# Patient Record
Sex: Female | Born: 1955 | Race: White | Hispanic: No | Marital: Married | Smoking: Never smoker
Health system: Southern US, Community
[De-identification: ages and names within clinical notes are randomized; demographics above are authoritative.]

## PROBLEM LIST (undated history)

## (undated) DIAGNOSIS — G4730 Sleep apnea, unspecified: Secondary | ICD-10-CM

## (undated) DIAGNOSIS — R0600 Dyspnea, unspecified: Secondary | ICD-10-CM

## (undated) DIAGNOSIS — R011 Cardiac murmur, unspecified: Secondary | ICD-10-CM

## (undated) DIAGNOSIS — K219 Gastro-esophageal reflux disease without esophagitis: Secondary | ICD-10-CM

## (undated) DIAGNOSIS — R7303 Prediabetes: Secondary | ICD-10-CM

## (undated) DIAGNOSIS — M1990 Unspecified osteoarthritis, unspecified site: Secondary | ICD-10-CM

## (undated) DIAGNOSIS — Z9889 Other specified postprocedural states: Secondary | ICD-10-CM

## (undated) DIAGNOSIS — E039 Hypothyroidism, unspecified: Secondary | ICD-10-CM

## (undated) DIAGNOSIS — I519 Heart disease, unspecified: Secondary | ICD-10-CM

## (undated) DIAGNOSIS — I10 Essential (primary) hypertension: Secondary | ICD-10-CM

## (undated) DIAGNOSIS — K8689 Other specified diseases of pancreas: Secondary | ICD-10-CM

## (undated) DIAGNOSIS — Z8489 Family history of other specified conditions: Secondary | ICD-10-CM

## (undated) HISTORY — PX: NASAL SINUS SURGERY: SHX719

---

## 1998-08-16 ENCOUNTER — Ambulatory Visit (HOSPITAL_COMMUNITY): Admission: RE | Admit: 1998-08-16 | Discharge: 1998-08-16 | Payer: Self-pay | Admitting: Internal Medicine

## 1998-08-17 ENCOUNTER — Encounter: Payer: Self-pay | Admitting: Internal Medicine

## 1998-09-01 ENCOUNTER — Ambulatory Visit (HOSPITAL_COMMUNITY): Admission: RE | Admit: 1998-09-01 | Discharge: 1998-09-01 | Payer: Self-pay | Admitting: Internal Medicine

## 1998-09-01 ENCOUNTER — Encounter: Payer: Self-pay | Admitting: Internal Medicine

## 1999-08-15 HISTORY — PX: TUBAL LIGATION: SHX77

## 2000-07-02 ENCOUNTER — Encounter: Payer: Self-pay | Admitting: Otolaryngology

## 2000-07-02 ENCOUNTER — Encounter: Admission: RE | Admit: 2000-07-02 | Discharge: 2000-07-02 | Payer: Self-pay | Admitting: Otolaryngology

## 2000-09-05 ENCOUNTER — Other Ambulatory Visit: Admission: RE | Admit: 2000-09-05 | Discharge: 2000-09-05 | Payer: Self-pay | Admitting: Otolaryngology

## 2000-09-05 ENCOUNTER — Encounter (INDEPENDENT_AMBULATORY_CARE_PROVIDER_SITE_OTHER): Payer: Self-pay

## 2001-03-16 ENCOUNTER — Other Ambulatory Visit: Payer: Self-pay

## 2001-03-16 ENCOUNTER — Encounter: Payer: Self-pay | Admitting: Emergency Medicine

## 2001-03-16 ENCOUNTER — Emergency Department (HOSPITAL_COMMUNITY): Admission: EM | Admit: 2001-03-16 | Discharge: 2001-03-17 | Payer: Self-pay | Admitting: Emergency Medicine

## 2001-03-16 ENCOUNTER — Other Ambulatory Visit (HOSPITAL_COMMUNITY): Payer: Self-pay

## 2001-04-03 ENCOUNTER — Ambulatory Visit (HOSPITAL_COMMUNITY): Admission: RE | Admit: 2001-04-03 | Discharge: 2001-04-03 | Payer: Self-pay | Admitting: Cardiology

## 2001-04-03 ENCOUNTER — Encounter: Payer: Self-pay | Admitting: Cardiology

## 2002-03-01 ENCOUNTER — Encounter: Payer: Self-pay | Admitting: Emergency Medicine

## 2002-03-01 ENCOUNTER — Emergency Department (HOSPITAL_COMMUNITY): Admission: EM | Admit: 2002-03-01 | Discharge: 2002-03-02 | Payer: Self-pay | Admitting: Emergency Medicine

## 2002-03-02 ENCOUNTER — Encounter: Payer: Self-pay | Admitting: Emergency Medicine

## 2004-04-17 ENCOUNTER — Emergency Department (HOSPITAL_COMMUNITY): Admission: EM | Admit: 2004-04-17 | Discharge: 2004-04-17 | Payer: Self-pay | Admitting: Emergency Medicine

## 2005-09-27 ENCOUNTER — Other Ambulatory Visit: Admission: RE | Admit: 2005-09-27 | Discharge: 2005-09-27 | Payer: Self-pay | Admitting: Obstetrics and Gynecology

## 2008-08-14 HISTORY — PX: LEFT HEART CATH AND CORONARY ANGIOGRAPHY: CATH118249

## 2008-11-01 ENCOUNTER — Inpatient Hospital Stay (HOSPITAL_COMMUNITY): Admission: EM | Admit: 2008-11-01 | Discharge: 2008-11-02 | Payer: Self-pay | Admitting: Emergency Medicine

## 2010-04-01 ENCOUNTER — Observation Stay (HOSPITAL_COMMUNITY): Admission: EM | Admit: 2010-04-01 | Discharge: 2010-04-02 | Payer: Self-pay | Admitting: Cardiovascular Disease

## 2010-10-27 LAB — CARDIAC PANEL(CRET KIN+CKTOT+MB+TROPI)
CK, MB: 1.2 ng/mL (ref 0.3–4.0)
Total CK: 76 U/L (ref 7–177)
Troponin I: 0.51 ng/mL (ref 0.00–0.06)

## 2010-10-27 LAB — COMPREHENSIVE METABOLIC PANEL
AST: 17 U/L (ref 0–37)
Albumin: 4 g/dL (ref 3.5–5.2)
BUN: 12 mg/dL (ref 6–23)
Chloride: 104 mEq/L (ref 96–112)
GFR calc non Af Amer: >60 mL/min (ref >60)
Glucose, Bld: 98 mg/dL (ref 70–99)
Sodium: 135 mEq/L (ref 135–145)
Total Bilirubin: 0.5 mg/dL (ref 0.3–1.2)

## 2010-10-27 LAB — CBC
HCT: 38.5 % (ref 36.0–46.0)
MCHC: 33 g/dL (ref 30.0–36.0)
Platelets: 213 10*3/uL (ref 150–400)

## 2010-10-27 LAB — DIFFERENTIAL
Basophils Absolute: 0 10*3/uL (ref 0.0–0.1)
Eosinophils Absolute: 0.1 10*3/uL (ref 0.0–0.7)
Eosinophils Relative: 1 % (ref 0–5)
Lymphocytes Relative: 19 % (ref 12–46)
Monocytes Relative: 6 % (ref 3–12)

## 2010-10-27 LAB — BRAIN NATRIURETIC PEPTIDE: Pro B Natriuretic peptide (BNP): 32 pg/mL (ref 0.0–100.0)

## 2010-10-27 LAB — PROTIME-INR: Prothrombin Time: 13.6 seconds (ref 11.6–15.2)

## 2010-10-27 LAB — D-DIMER, QUANTITATIVE: D-Dimer, Quant: 0.48 ug/mL-FEU (ref 0.00–0.48)

## 2010-11-24 LAB — BASIC METABOLIC PANEL
BUN: 17 mg/dL (ref 6–23)
CO2: 29 mEq/L (ref 19–32)
Calcium: 9 mg/dL (ref 8.4–10.5)
Chloride: 106 mEq/L (ref 96–112)
Creatinine, Ser: 0.69 mg/dL (ref 0.4–1.2)
GFR calc Af Amer: >60 mL/min (ref >60)
GFR calc non Af Amer: >60 mL/min (ref >60)
Glucose, Bld: 108 mg/dL — ABNORMAL HIGH (ref 70–99)
Potassium: 3.6 mEq/L (ref 3.5–5.1)
Sodium: 139 mEq/L (ref 135–145)

## 2010-11-24 LAB — CBC
HCT: 38 % (ref 36.0–46.0)
Hemoglobin: 12.6 g/dL (ref 12.0–15.0)
MCHC: 33.3 g/dL (ref 30.0–36.0)
MCV: 81.1 fL (ref 78.0–100.0)
Platelets: 234 10*3/uL (ref 150–400)
RBC: 4.69 MIL/uL (ref 3.87–5.11)
RDW: 14.5 % (ref 11.5–15.5)
WBC: 8.1 10*3/uL (ref 4.0–10.5)

## 2010-11-24 LAB — LIPID PANEL
Total CHOL/HDL Ratio: 2.9 RATIO
Triglycerides: 69 mg/dL (ref <150)
VLDL: 14 mg/dL (ref 0–40)

## 2010-11-24 LAB — DIFFERENTIAL
Eosinophils Absolute: 0.1 10*3/uL (ref 0.0–0.7)
Monocytes Relative: 7 % (ref 3–12)
Neutro Abs: 5.8 10*3/uL (ref 1.7–7.7)

## 2010-11-24 LAB — CK TOTAL AND CKMB (NOT AT ARMC)
CK, MB: 1.3 ng/mL (ref 0.3–4.0)
Relative Index: INVALID (ref 0.0–2.5)
Total CK: 68 U/L (ref 7–177)

## 2010-11-24 LAB — POCT CARDIAC MARKERS
CKMB, poc: <1 ng/mL — ABNORMAL LOW (ref 1.0–8.0)
CKMB, poc: <1 ng/mL — ABNORMAL LOW (ref 1.0–8.0)
Myoglobin, poc: 56.6 ng/mL (ref 12–200)
Troponin i, poc: <0.05 ng/mL (ref 0.00–0.09)
Troponin i, poc: <0.05 ng/mL (ref 0.00–0.09)

## 2010-11-24 LAB — TROPONIN I: Troponin I: <0.01 ng/mL (ref 0.00–0.06)

## 2010-11-24 LAB — PROTIME-INR: INR: 1 (ref 0.00–1.49)

## 2010-11-24 LAB — HEPARIN LEVEL (UNFRACTIONATED): Heparin Unfractionated: 0.15 IU/mL — ABNORMAL LOW (ref 0.30–0.70)

## 2010-12-30 NOTE — Discharge Summary (Signed)
[name], [name]          ACCOUNT NO.:  [id number]   MEDICAL RECORD NO.:  [id number]          PATIENT TYPE:  INP   LOCATION:  2002                         FACILITY:  [facility]   PHYSICIAN:  Ricki Rodriguez, M.D.  DATE OF BIRTH:  05-30-1956   DATE OF ADMISSION:  11/01/2008  DATE OF DISCHARGE:  11/02/2008                               DISCHARGE SUMMARY   FINAL DIAGNOSES:  1. Chest pain.  2. Anxiety.   DISCHARGE MEDICATIONS:  1. Norvasc 2.5 mg 1 daily.  2. Metoprolol 25 mg half twice daily.  3. Valium 5 mg 1 daily as needed.   Return to Dr. Algie Coffer in 2 weeks.  The patient to call [phone] for  appointment.   DISCHARGE DIET:  Low-sodium, heart-healthy diet.   DISCHARGE ACTIVITY:  The patient is to increase activity slowly.  The  patient to notify right groin pain, swelling, or discharge.   HISTORY:  This 55 year old white female, who presented with 60-month  history of left arm numbness lasting for 5-10 minutes.   PHYSICAL EXAMINATION:  VITAL SIGNS:  Temperature 97, pulse 91,  respirations 18, blood pressure 136/73.  GENERAL:  The patient is well-built, well-nourished white female in no  acute distress.  HEENT: The patient is normocephalic, atraumatic with brown eyes.  Pupils  equally reactive to light.  Extraocular movement intact.  She wears  glasses.  NECK:  Supple.  Negative JVD.  LUNGS:  Clear bilaterally.  HEART:  Normal S1 and S2.  Regular rate and rhythm.  ABDOMEN:  Soft and nontender.  EXTREMITIES:  No edema, cyanosis, or clubbing.  CNS:  Cranial nerves grossly intact and the patient moves all 4  extremities.  She is alert and oriented x3.   LABORATORY DATA:  Normal hemoglobin/hematocrit, WBC count, platelet  count.  Normal PT/INR.  Normal electrolytes, BUN, creatinine.  Normal CK-  MB, troponin I.  Normal cholesterol, triglyceride, and HDL cholesterol.   EKG:  Normal sinus rhythm.  Nuclear stress test suggestive of possible  ischemia.   Cardiac  catheterization revealed normal coronaries.   HOSPITAL COURSE:  The patient was admitted to telemetry unit.  She  underwent nuclear stress test that showed possible ischemia, and she  underwent cardiac catheterization that failed to show any coronary  artery disease. The patient's medications were adjusted and she was  discharged home in satisfactory condition with followup by me in 1 week.      Ricki Rodriguez, M.D.  Electronically Signed     ASK/MEDQ  D:  12/23/2008  T:  12/24/2008  Job:  [id number]

## 2011-10-21 ENCOUNTER — Ambulatory Visit (INDEPENDENT_AMBULATORY_CARE_PROVIDER_SITE_OTHER): Payer: Managed Care, Other (non HMO) | Admitting: Family Medicine

## 2011-10-21 VITALS — BP 152/92 | HR 60 | Temp 98.4°F | Resp 18 | Ht 61.5 in | Wt 192.1 lb

## 2011-10-21 DIAGNOSIS — R21 Rash and other nonspecific skin eruption: Secondary | ICD-10-CM

## 2011-10-21 DIAGNOSIS — R198 Other specified symptoms and signs involving the digestive system and abdomen: Secondary | ICD-10-CM

## 2011-10-21 DIAGNOSIS — R197 Diarrhea, unspecified: Secondary | ICD-10-CM

## 2011-10-21 LAB — COMPREHENSIVE METABOLIC PANEL
ALT: 16 U/L (ref 0–35)
CO2: 30 mEq/L (ref 19–32)
Calcium: 9.4 mg/dL (ref 8.4–10.5)
Chloride: 104 mEq/L (ref 96–112)
Creat: 0.73 mg/dL (ref 0.50–1.10)
Glucose, Bld: 82 mg/dL (ref 70–99)
Total Protein: 7.7 g/dL (ref 6.0–8.3)

## 2011-10-21 LAB — POCT URINALYSIS DIPSTICK
Bilirubin, UA: NEGATIVE
Glucose, UA: NEGATIVE
Ketones, UA: NEGATIVE
Leukocytes, UA: NEGATIVE
Nitrite, UA: NEGATIVE

## 2011-10-21 LAB — POCT SKIN KOH: Skin KOH, POC: NEGATIVE

## 2011-10-21 LAB — POCT UA - MICROSCOPIC ONLY
Mucus, UA: NEGATIVE
RBC, urine, microscopic: NEGATIVE
Yeast, UA: NEGATIVE

## 2011-10-21 MED ORDER — DIPHENOXYLATE-ATROPINE 2.5-0.025 MG PO TABS: 1.0000 | ORAL_TABLET | Freq: Four times a day (QID) | ORAL | Status: AC | PRN

## 2011-10-21 MED ORDER — CLOTRIMAZOLE-BETAMETHASONE 1-0.05 % EX CREA: TOPICAL_CREAM | Freq: Two times a day (BID) | CUTANEOUS | Status: DC

## 2011-10-21 NOTE — Progress Notes (Signed)
  Subjective:    Patient ID: [name], female    DOB: 1955-09-04, 56 y.o.   MRN: [id number]  HPI 56 yo female with diarrhea symptoms. For over a month, loose stools 5-6 times a day.  Did have colonscopy 1 year ago - diverticulosis but otherwise normal.  Was just screening colonoscopy.  Also feels bloated and that GERD worse.  Works [place] but no recent travel.  Yesterday started having abdominal pain and cramping.  Wasn't previously.  No blood, mucus.  Normal color.  NO recent antibiotics. NO trouble with bowels like this before.  No new meds. Also some incresaed urinary frequency.  No pain.  Worried about "bacteria in her body".  Spot on right buttock for a couple weeks - noticed in mirror.  Doesn't itch or hurt.  Red and flaky.   Saw podiatrist and being treated for foot fungus.  Hasn't started terbinafine yet.    Review of Systems     Objective:   Physical Exam  Constitutional: Vital signs are normal. She appears well-developed and well-nourished. She is active.  Cardiovascular: Normal rate, regular rhythm, normal heart sounds and normal pulses.   Pulmonary/Chest: Effort normal and breath sounds normal.  Abdominal: Soft. Normal appearance and bowel sounds are normal. She exhibits no distension and no mass. There is no hepatosplenomegaly. There is tenderness. There is no rigidity, no rebound, no guarding, no CVA tenderness, no tenderness at McBurney's point and negative Murphy's sign. No hernia.  Neurological: She is alert.   On right buttock, large, erythematous, scaly rash.  Flat.   Results for orders placed in visit on 10/21/11  POCT UA - MICROSCOPIC ONLY      Component Value Range   WBC, Ur, HPF, POC 0-1     RBC, urine, microscopic neg     Bacteria, U Microscopic neg     Mucus, UA neg     Epithelial cells, urine per micros 0-1     Crystals, Ur, HPF, POC neg     Casts, Ur, LPF, POC neg     Yeast, UA neg    POCT URINALYSIS DIPSTICK      Component Value Range   Color, UA yellow     Clarity, UA clear     Glucose, UA neg     Bilirubin, UA neg     Ketones, UA neg     Spec Grav, UA 1.010     Blood, UA trace-lysed     pH, UA 5.0     Protein, UA neg     Urobilinogen, UA 0.2     Nitrite, UA neg     Leukocytes, UA Negative    POCT SKIN KOH      Component Value Range   Skin KOH, POC Negative           Assessment & Plan:  Diarrhea, cramping - no red flags other than duration.  Will check stool studies.  In meantime try probiotics, fiber caplets, and lomotil (just for a couple days).  If studies negative and these meds no help, then refer to GI  Skin rash - KOH negative.  Still suspicious.  Lotrisone cream

## 2011-12-28 ENCOUNTER — Other Ambulatory Visit: Payer: Self-pay | Admitting: Gastroenterology

## 2011-12-28 DIAGNOSIS — R197 Diarrhea, unspecified: Secondary | ICD-10-CM

## 2011-12-29 ENCOUNTER — Ambulatory Visit
Admission: RE | Admit: 2011-12-29 | Discharge: 2011-12-29 | Disposition: A | Discharge status: Home / Self Care | Payer: Managed Care, Other (non HMO) | Source: Ambulatory Visit | Attending: Gastroenterology | Admitting: Gastroenterology

## 2011-12-29 DIAGNOSIS — R197 Diarrhea, unspecified: Secondary | ICD-10-CM

## 2011-12-29 MED ORDER — IOHEXOL 300 MG/ML  SOLN
100.0000 mL | Freq: Once | INTRAMUSCULAR | Status: AC | PRN
  Administered 2011-12-29: 100 mL via INTRAVENOUS

## 2012-03-16 ENCOUNTER — Encounter (HOSPITAL_COMMUNITY): Payer: Self-pay | Admitting: *Deleted

## 2012-03-16 ENCOUNTER — Emergency Department (HOSPITAL_COMMUNITY)
Admission: EM | Admit: 2012-03-16 | Discharge: 2012-03-16 | Disposition: A | Discharge status: Home / Self Care | Payer: Managed Care, Other (non HMO) | Attending: Emergency Medicine | Admitting: Emergency Medicine

## 2012-03-16 ENCOUNTER — Emergency Department (HOSPITAL_COMMUNITY): Payer: Managed Care, Other (non HMO)

## 2012-03-16 DIAGNOSIS — R0602 Shortness of breath: Secondary | ICD-10-CM | POA: Insufficient documentation

## 2012-03-16 DIAGNOSIS — R002 Palpitations: Secondary | ICD-10-CM | POA: Insufficient documentation

## 2012-03-16 DIAGNOSIS — I10 Essential (primary) hypertension: Secondary | ICD-10-CM | POA: Insufficient documentation

## 2012-03-16 DIAGNOSIS — R079 Chest pain, unspecified: Secondary | ICD-10-CM | POA: Insufficient documentation

## 2012-03-16 HISTORY — DX: Other specified diseases of pancreas: K86.89

## 2012-03-16 HISTORY — DX: Essential (primary) hypertension: I10

## 2012-03-16 LAB — CBC WITH DIFFERENTIAL/PLATELET
Basophils Absolute: 0 10*3/uL (ref 0.0–0.1)
Basophils Relative: 1 % (ref 0–1)
Eosinophils Absolute: 0.1 10*3/uL (ref 0.0–0.7)
Eosinophils Relative: 1 % (ref 0–5)
HCT: 36.4 % (ref 36.0–46.0)
Lymphocytes Relative: 21 % (ref 12–46)
MCH: 26.9 pg (ref 26.0–34.0)
MCHC: 33.5 g/dL (ref 30.0–36.0)
MCV: 80.2 fL (ref 78.0–100.0)
Monocytes Absolute: 0.6 10*3/uL (ref 0.1–1.0)
Platelets: 205 10*3/uL (ref 150–400)
RDW: 13.6 % (ref 11.5–15.5)
WBC: 8.1 10*3/uL (ref 4.0–10.5)

## 2012-03-16 LAB — COMPREHENSIVE METABOLIC PANEL
ALT: 15 U/L (ref 0–35)
AST: 15 U/L (ref 0–37)
CO2: 29 mEq/L (ref 19–32)
Calcium: 9.5 mg/dL (ref 8.4–10.5)
Creatinine, Ser: 0.68 mg/dL (ref 0.50–1.10)
GFR calc non Af Amer: >90 mL/min (ref >90)
Sodium: 142 mEq/L (ref 135–145)
Total Protein: 7.4 g/dL (ref 6.0–8.3)

## 2012-03-16 LAB — D-DIMER, QUANTITATIVE: D-Dimer, Quant: 0.33 ug/mL-FEU (ref 0.00–0.48)

## 2012-03-16 LAB — URINALYSIS, ROUTINE W REFLEX MICROSCOPIC
Hgb urine dipstick: NEGATIVE
Nitrite: NEGATIVE
Specific Gravity, Urine: 1.01 (ref 1.005–1.030)
Urobilinogen, UA: 0.2 mg/dL (ref 0.0–1.0)
pH: 7 (ref 5.0–8.0)

## 2012-03-16 LAB — POCT I-STAT TROPONIN I: Troponin i, poc: 0.01 ng/mL (ref 0.00–0.08)

## 2012-03-16 MED ORDER — SODIUM CHLORIDE 0.9 % IV BOLUS (SEPSIS)
500.0000 mL | Freq: Once | INTRAVENOUS | Status: AC
  Administered 2012-03-16: 1000 mL via INTRAVENOUS

## 2012-03-16 NOTE — ED Notes (Signed)
Patient given instructions and teach back method used patient verbalized an understanding.

## 2012-03-16 NOTE — ED Notes (Signed)
Patient advises that she has a slight discomfort in the upper back area and neck.

## 2012-03-16 NOTE — ED Provider Notes (Signed)
History     CSN: [id number]  Arrival date & time 03/16/12  1603   First MD Initiated Contact with Patient 03/16/12 1633      Chief Complaint  Patient presents with  . Palpitations    (Consider location/radiation/quality/duration/timing/severity/associated sxs/prior treatment) HPI Pt states that today around 1400 she was lying down and had palpitations described as a pounding heart beat but not fast. States she had sharp pain across her shoulder and mild SOB. Symptoms have since abated. No CP, lower ext swelling, N/V/D. Pt states she has not been drinking as much fluid lately.  Past Medical History  Diagnosis Date  . Pancreatic insufficiency   . Hypertension     History reviewed. No pertinent past surgical history.  History reviewed. No pertinent family history.  History  Substance Use Topics  . Smoking status: Never Smoker   . Smokeless tobacco: Not on file  . Alcohol Use: No    OB History    Grav Para Term Preterm Abortions TAB SAB Ect Mult Living                  Review of Systems  Constitutional: Negative for fever, chills and diaphoresis.  HENT: Negative for neck pain and neck stiffness.   Respiratory: Positive for shortness of breath. Negative for cough and wheezing.   Cardiovascular: Positive for palpitations. Negative for chest pain and leg swelling.  Gastrointestinal: Negative for nausea, vomiting, abdominal pain and diarrhea.  Genitourinary: Negative for dysuria.  Musculoskeletal: Positive for myalgias. Negative for joint swelling and arthralgias.  Skin: Negative for rash and wound.  Neurological: Negative for dizziness, syncope, weakness, numbness and headaches.    Allergies  Tylenol  Home Medications   Current Outpatient Rx  Name Route Sig Dispense Refill  . AMLODIPINE BESYLATE 5 MG PO TABS Oral Take 5 mg by mouth daily.    Marland Kitchen LOSARTAN POTASSIUM 100 MG PO TABS Oral Take 100 mg by mouth daily.    Marland Kitchen METOPROLOL TARTRATE 25 MG PO TABS Oral Take 25 mg  by mouth. 1/2 tab po bid    . MULTI-VITAMIN/MINERALS PO TABS Oral Take 1 tablet by mouth daily.    Marland Kitchen PANCRELIPASE (LIP-PROT-AMYL) 24000 UNITS PO CPEP Oral Take 1-2 capsules by mouth 3 (three) times daily with meals. Takes 2 capsules with meals and 1 capsule with snacks.      BP 123/86  Pulse 67  Temp 98.2 F (36.8 C) (Oral)  Resp 23  SpO2 100%  Physical Exam  Nursing note and vitals reviewed. Constitutional: She is oriented to person, place, and time. She appears well-developed and well-nourished. No distress.  HENT:  Head: Normocephalic and atraumatic.  Mouth/Throat: Oropharynx is clear and moist.  Eyes: EOM are normal. Pupils are equal, round, and reactive to light.  Neck: Normal range of motion. Neck supple.  Cardiovascular: Normal rate and regular rhythm.  Exam reveals no gallop and no friction rub.   No murmur heard. Pulmonary/Chest: Effort normal and breath sounds normal. No respiratory distress. She has no wheezes. She has no rales. She exhibits no tenderness.  Abdominal: Soft. Bowel sounds are normal. There is no tenderness. There is no rebound and no guarding.  Musculoskeletal: Normal range of motion. She exhibits no edema and no tenderness (no LE swelling or tenderness).  Neurological: She is alert and oriented to person, place, and time.       5/5 motor, sensation intact  Skin: Skin is warm and dry. No rash noted. No erythema.  Psychiatric:  She has a normal mood and affect. Her behavior is normal.    ED Course  Procedures (including critical care time)  Labs Reviewed  COMPREHENSIVE METABOLIC PANEL - Abnormal; Notable for the following:    Glucose, Bld 101 (*)     All other components within normal limits  URINALYSIS, ROUTINE W REFLEX MICROSCOPIC - Abnormal; Notable for the following:    Leukocytes, UA TRACE (*)     All other components within normal limits  CBC WITH DIFFERENTIAL  D-DIMER, QUANTITATIVE  POCT I-STAT TROPONIN I  URINE MICROSCOPIC-ADD ON   Dg  Chest 2 View  03/16/2012  *RADIOLOGY REPORT*  Clinical Data: Chest pain and heart palpitations.  CHEST - 2 VIEW  Comparison: 04/01/2010  Findings: The cardiomediastinal silhouette is unremarkable. There is no evidence of focal airspace disease, pulmonary edema, suspicious pulmonary nodule/mass, pleural effusion, or pneumothorax. No acute bony abnormalities are identified.  IMPRESSION: No evidence of active cardiopulmonary disease.  Original Report Authenticated By: Rosendo Gros, M.D.     1. Palpitations       Date: 03/16/2012  Rate:70  Rhythm: normal sinus rhythm  QRS Axis: normal  Intervals: normal  ST/T Wave abnormalities: normal  Conduction Disutrbances:none  Narrative Interpretation:   Old EKG Reviewed: unchanged   MDM   Pt remains asymptomatic in ED. Advised to return for worsening symptoms or any concerns. F/U with PMD and cardiologist       Loren Racer, MD 03/16/12 [phone]

## 2012-03-16 NOTE — ED Notes (Signed)
Pt reports lying down today, had onset of palpitations and pain to back of neck and shoulders. ekg being done at triage, no acute distress noted.

## 2012-03-16 NOTE — ED Notes (Signed)
Placed in a gown and on all monitors

## 2012-03-16 NOTE — ED Notes (Signed)
Patient transported to X-ray for a chest x-ray

## 2012-07-30 ENCOUNTER — Encounter (HOSPITAL_COMMUNITY): Payer: Self-pay | Admitting: Pharmacy Technician

## 2012-08-02 ENCOUNTER — Other Ambulatory Visit: Payer: Self-pay | Admitting: Cardiovascular Disease

## 2012-08-02 ENCOUNTER — Encounter (HOSPITAL_COMMUNITY): Payer: Self-pay | Admitting: *Deleted

## 2012-08-02 DIAGNOSIS — Z1231 Encounter for screening mammogram for malignant neoplasm of breast: Secondary | ICD-10-CM

## 2012-08-02 NOTE — Pre-Procedure Instructions (Signed)
Your procedure is scheduled on:Wednesday, August 21, 2012 Report to [facility] Admitting [id number] Call this number if you have problems morning of your procedure:[phone]  Follow all bowel prep instructions per your doctor's orders.  Do not eat or drink anything after midnight the night before your procedure. You may brush your teeth, rinse out your mouth, but no water, no food, no chewing gum, no mints, no candies, no chewing tobacco.     Take these medicines the morning of your procedure with A SIP OF WATER:Norvasc and Metoprolol   Please make arrangements for a responsible person to drive you home after the procedure. You cannot go home by cab/taxi. We recommend you have someone with you at home the first 24 hours after your procedure. Driver for procedure is mother Monque  LEAVE ALL VALUABLES, JEWELRY, BILLFOLD AT HOME.  NO DENTURES, CONTACT LENSES ALLOWED IN THE ENDOSCOPY ROOM.   YOU MAY WEAR DEODORANT, PLEASE REMOVE ALL JEWELRY, WATCHES RINGS, BODY PIERCINGS AND LEAVE AT HOME.   WOMEN: NO MAKE-UP, LOTIONS PERFUMES

## 2012-08-21 ENCOUNTER — Encounter (HOSPITAL_COMMUNITY): Payer: Self-pay | Admitting: *Deleted

## 2012-08-21 ENCOUNTER — Ambulatory Visit (HOSPITAL_COMMUNITY)
Admission: RE | Admit: 2012-08-21 | Discharge: 2012-08-21 | Disposition: A | Discharge status: Home / Self Care | Payer: Managed Care, Other (non HMO) | Source: Ambulatory Visit | Attending: Gastroenterology | Admitting: Gastroenterology

## 2012-08-21 ENCOUNTER — Ambulatory Visit (HOSPITAL_COMMUNITY): Payer: Managed Care, Other (non HMO) | Admitting: Anesthesiology

## 2012-08-21 ENCOUNTER — Encounter (HOSPITAL_COMMUNITY)
Admission: RE | Disposition: A | Discharge status: Home / Self Care | Payer: Self-pay | Source: Ambulatory Visit | Attending: Gastroenterology

## 2012-08-21 ENCOUNTER — Encounter (HOSPITAL_COMMUNITY): Payer: Self-pay | Admitting: Anesthesiology

## 2012-08-21 DIAGNOSIS — R109 Unspecified abdominal pain: Secondary | ICD-10-CM | POA: Insufficient documentation

## 2012-08-21 DIAGNOSIS — Z538 Procedure and treatment not carried out for other reasons: Secondary | ICD-10-CM | POA: Insufficient documentation

## 2012-08-21 DIAGNOSIS — Z01810 Encounter for preprocedural cardiovascular examination: Secondary | ICD-10-CM | POA: Insufficient documentation

## 2012-08-21 HISTORY — DX: Heart disease, unspecified: I51.9

## 2012-08-21 SURGERY — CANCELLED PROCEDURE
Anesthesia: Monitor Anesthesia Care

## 2012-08-21 MED ORDER — FENTANYL CITRATE 0.05 MG/ML IJ SOLN: 25.0000 ug | INTRAMUSCULAR | Status: DC | PRN

## 2012-08-21 MED ORDER — SODIUM CHLORIDE 0.9 % IV SOLN: INTRAVENOUS | Status: DC

## 2012-08-21 MED ORDER — LACTATED RINGERS IV SOLN: INTRAVENOUS | Status: DC

## 2012-08-21 MED ORDER — PROMETHAZINE HCL 25 MG/ML IJ SOLN: 6.2500 mg | INTRAMUSCULAR | Status: DC | PRN

## 2012-08-21 SURGICAL SUPPLY — 15 items

## 2012-08-21 NOTE — Anesthesia Preprocedure Evaluation (Addendum)
Anesthesia Evaluation  Patient identified by MRN, date of birth, ID band Patient awake    Reviewed: Allergy & Precautions, H&P , NPO status , Patient's Chart, lab work & pertinent test results  Airway Mallampati: II TM Distance: >3 FB Neck ROM: Full    Dental  (+) Teeth Intact and Dental Advisory Given   Pulmonary neg pulmonary ROS,  breath sounds clear to auscultation  Pulmonary exam normal       Cardiovascular hypertension, Pt. on medications and Pt. on home beta blockers + CAD Rhythm:Regular Rate:Normal  Recent episodes of atypical chest pain, occuring with work. Scheduled for EST recently but had to cancel secondary to URI (now resolved). Pain described as pressure without radiation, n/v or diaphoresis.   Neuro/Psych negative neurological ROS  negative psych ROS   GI/Hepatic negative GI ROS, Neg liver ROS, Pancreatic insufficiency   Endo/Other  negative endocrine ROS  Renal/GU negative Renal ROS  negative genitourinary   Musculoskeletal negative musculoskeletal ROS (+)   Abdominal   Peds negative pediatric ROS (+)  Hematology negative hematology ROS (+)   Anesthesia Other Findings   Reproductive/Obstetrics negative OB ROS                         Anesthesia Physical Anesthesia Plan  ASA: III  Anesthesia Plan: MAC   Post-op Pain Management:    Induction: Intravenous  Airway Management Planned: Nasal Cannula  Additional Equipment:   Intra-op Plan:   Post-operative Plan:   Informed Consent: I have reviewed the patients History and Physical, chart, labs and discussed the procedure including the risks, benefits and alternatives for the proposed anesthesia with the patient or authorized representative who has indicated his/her understanding and acceptance.   Dental advisory given  Plan Discussed with: CRNA  Anesthesia Plan Comments:         Anesthesia Quick Evaluation

## 2012-09-02 ENCOUNTER — Ambulatory Visit
Admission: RE | Admit: 2012-09-02 | Discharge: 2012-09-02 | Disposition: A | Discharge status: Home / Self Care | Payer: Managed Care, Other (non HMO) | Source: Ambulatory Visit | Attending: Cardiovascular Disease | Admitting: Cardiovascular Disease

## 2012-09-02 DIAGNOSIS — Z1231 Encounter for screening mammogram for malignant neoplasm of breast: Secondary | ICD-10-CM

## 2012-12-30 ENCOUNTER — Encounter (HOSPITAL_COMMUNITY): Payer: Self-pay | Admitting: Pharmacy Technician

## 2012-12-30 ENCOUNTER — Encounter (HOSPITAL_COMMUNITY): Payer: Self-pay | Admitting: *Deleted

## 2012-12-30 NOTE — Progress Notes (Signed)
Office visit from Dr. Algie Coffer 08/02/2012 ,Stress Test 08/22/2012 from Dr. Algie Coffer  All on chart.

## 2012-12-31 ENCOUNTER — Other Ambulatory Visit: Payer: Self-pay | Admitting: Gastroenterology

## 2013-01-01 ENCOUNTER — Ambulatory Visit (HOSPITAL_COMMUNITY): Payer: Managed Care, Other (non HMO) | Admitting: Anesthesiology

## 2013-01-01 ENCOUNTER — Encounter (HOSPITAL_COMMUNITY): Payer: Self-pay | Admitting: *Deleted

## 2013-01-01 ENCOUNTER — Encounter (HOSPITAL_COMMUNITY): Payer: Self-pay | Admitting: Anesthesiology

## 2013-01-01 ENCOUNTER — Encounter (HOSPITAL_COMMUNITY)
Admission: RE | Disposition: A | Discharge status: Home / Self Care | Payer: Self-pay | Source: Ambulatory Visit | Attending: Gastroenterology

## 2013-01-01 ENCOUNTER — Ambulatory Visit (HOSPITAL_COMMUNITY)
Admission: RE | Admit: 2013-01-01 | Discharge: 2013-01-01 | Disposition: A | Discharge status: Home / Self Care | Payer: Managed Care, Other (non HMO) | Source: Ambulatory Visit | Attending: Gastroenterology | Admitting: Gastroenterology

## 2013-01-01 DIAGNOSIS — Z7982 Long term (current) use of aspirin: Secondary | ICD-10-CM | POA: Insufficient documentation

## 2013-01-01 DIAGNOSIS — R197 Diarrhea, unspecified: Secondary | ICD-10-CM | POA: Insufficient documentation

## 2013-01-01 DIAGNOSIS — K2940 Chronic atrophic gastritis without bleeding: Secondary | ICD-10-CM | POA: Insufficient documentation

## 2013-01-01 DIAGNOSIS — K8689 Other specified diseases of pancreas: Secondary | ICD-10-CM | POA: Insufficient documentation

## 2013-01-01 DIAGNOSIS — Z79899 Other long term (current) drug therapy: Secondary | ICD-10-CM | POA: Insufficient documentation

## 2013-01-01 DIAGNOSIS — Z886 Allergy status to analgesic agent status: Secondary | ICD-10-CM | POA: Insufficient documentation

## 2013-01-01 DIAGNOSIS — K219 Gastro-esophageal reflux disease without esophagitis: Secondary | ICD-10-CM | POA: Insufficient documentation

## 2013-01-01 DIAGNOSIS — A048 Other specified bacterial intestinal infections: Secondary | ICD-10-CM | POA: Insufficient documentation

## 2013-01-01 DIAGNOSIS — I209 Angina pectoris, unspecified: Secondary | ICD-10-CM | POA: Insufficient documentation

## 2013-01-01 DIAGNOSIS — I10 Essential (primary) hypertension: Secondary | ICD-10-CM | POA: Insufficient documentation

## 2013-01-01 HISTORY — PX: ESOPHAGOGASTRODUODENOSCOPY (EGD) WITH PROPOFOL: SHX5813

## 2013-01-01 HISTORY — DX: Gastro-esophageal reflux disease without esophagitis: K21.9

## 2013-01-01 HISTORY — PX: EUS: SHX5427

## 2013-01-01 SURGERY — ESOPHAGOGASTRODUODENOSCOPY (EGD) WITH PROPOFOL
Anesthesia: Monitor Anesthesia Care

## 2013-01-01 MED ORDER — FENTANYL CITRATE 0.05 MG/ML IJ SOLN
INTRAMUSCULAR | Status: DC | PRN
  Administered 2013-01-01: 100 ug via INTRAVENOUS

## 2013-01-01 MED ORDER — MIDAZOLAM HCL 5 MG/5ML IJ SOLN
INTRAMUSCULAR | Status: DC | PRN
  Administered 2013-01-01: 2 mg via INTRAVENOUS

## 2013-01-01 MED ORDER — SODIUM CHLORIDE 0.9 % IV SOLN: INTRAVENOUS | Status: DC

## 2013-01-01 MED ORDER — BUTAMBEN-TETRACAINE-BENZOCAINE 2-2-14 % EX AERO
INHALATION_SPRAY | CUTANEOUS | Status: DC | PRN
  Administered 2013-01-01: 2 via TOPICAL

## 2013-01-01 MED ORDER — KETAMINE HCL 10 MG/ML IJ SOLN
INTRAMUSCULAR | Status: DC | PRN
  Administered 2013-01-01: 20 mg via INTRAVENOUS

## 2013-01-01 MED ORDER — LACTATED RINGERS IV SOLN
INTRAVENOUS | Status: DC
  Administered 2013-01-01: 1000 mL via INTRAVENOUS

## 2013-01-01 MED ORDER — PROPOFOL INFUSION 10 MG/ML OPTIME
INTRAVENOUS | Status: DC | PRN
  Administered 2013-01-01: 60 ug/kg/min via INTRAVENOUS

## 2013-01-01 SURGICAL SUPPLY — 15 items

## 2013-01-01 NOTE — Addendum Note (Signed)
Addended by: Willis Modena on: 01/01/2013 08:12 AM   Modules accepted: Orders

## 2013-01-01 NOTE — Anesthesia Postprocedure Evaluation (Signed)
Anesthesia Post Note  Patient: [name]  Procedure(s) Performed: Procedure(s) (LRB): ESOPHAGOGASTRODUODENOSCOPY (EGD) WITH PROPOFOL (N/A) ESOPHAGEAL ENDOSCOPIC ULTRASOUND (EUS) RADIAL (N/A)  Anesthesia type: MAC  Patient location: PACU  Post pain: Pain level controlled  Post assessment: Post-op Vital signs reviewed  Last Vitals: BP 126/74  Pulse 53  Temp(Src) 36.6 C (Oral)  Resp 18  Ht 5\' 2"  (1.575 m)  Wt 170 lb (77.111 kg)  BMI 31.09 kg/m2  SpO2 89%  Post vital signs: Reviewed  Level of consciousness: awake  Complications: No apparent anesthesia complications

## 2013-01-01 NOTE — Transfer of Care (Signed)
Immediate Anesthesia Transfer of Care Note  Patient: [name]  Procedure(s) Performed: Procedure(s): ESOPHAGOGASTRODUODENOSCOPY (EGD) WITH PROPOFOL (N/A) ESOPHAGEAL ENDOSCOPIC ULTRASOUND (EUS) RADIAL (N/A)  Patient Location: PACU  Anesthesia Type:MAC  Level of Consciousness: awake, alert  and oriented  Airway & Oxygen Therapy: Patient Spontanous Breathing and Patient connected to nasal cannula oxygen  Post-op Assessment: Report given to PACU RN and Post -op Vital signs reviewed and stable  Post vital signs: Reviewed and stable  Complications: No apparent anesthesia complications

## 2013-01-01 NOTE — Op Note (Signed)
[facility] [address] [place], [address]   ENDOSCOPY PROCEDURE REPORT  PATIENT: [name], [name]  MR#: [id number] BIRTHDATE: [birth year] , 56  yrs. old GENDER: Female ENDOSCOPIST: Willis Modena, MD REFERRED BY:  Miguel Aschoff, M.D. PROCEDURE DATE:  01/01/2013 PROCEDURE:  EGD w/ biopsy; Endoscopic ultrasound (EUS) ASA CLASS:     Class III INDICATIONS:  chronic left upper quadrant abdominal pain, negative CT scan. MEDICATIONS: MAC sedation, administered by CRNA TOPICAL ANESTHETIC: Cetacaine Spray  DESCRIPTION OF PROCEDURE: After the risks benefits and alternatives of the procedure were thoroughly explained, informed consent was obtained.  The diagnostic forward-viewing endoscope and then the radial echoendoscope was introduced through the mouth and advanced to the second portion of the duodenum. Without limitations.  The instrument was slowly withdrawn as the mucosa was fully examined.   Findings:  EGD:  Gastric erosions and gastritis in the antrum; biopsies obtained with cold biopsy forceps.  Otherwise normal endoscopy to the second portion of the duodenum.  EUS:  Pancreatic parenchyma of the head, uncinate, genu, body and tail was normal; no chronic pancreatitis and no pancreatic mass.  Bile duct normal-caliber without wall thickening, stone or mass. Normal-appearing gallbladder without wall thickening or stones. Incidental notation made of multiple left and right renal cysts.          The scope was then withdrawn from the patient and the procedure completed.  ENDOSCOPIC IMPRESSION:     As above.  Gastric erosions could perhaps cause some of her abdominal pain.  RECOMMENDATIONS:     1.  Watch for potential complications of procedure. 2.  Await biopsy results.  Treat if H. pylori seen. 3.  Continue home medications, including pancreatic enzymes. 4.  Follow-up with Dr. Dulce Sellar in 3-4 months.  eSigned:  Willis Modena, MD 01/01/2013 9:07  AM  CC:

## 2013-01-01 NOTE — Anesthesia Preprocedure Evaluation (Addendum)
Anesthesia Evaluation  Patient identified by MRN, date of birth, ID band Patient awake    Reviewed: Allergy & Precautions, H&P , NPO status , Patient's Chart, lab work & pertinent test results  Airway Mallampati: II TM Distance: >3 FB Neck ROM: Full    Dental  (+) Teeth Intact and Dental Advisory Given   Pulmonary neg pulmonary ROS,  breath sounds clear to auscultation  Pulmonary exam normal       Cardiovascular hypertension, Pt. on medications and Pt. on home beta blockers + CAD Rhythm:Regular Rate:Normal  Recent episodes of atypical chest pain, occuring with work. Scheduled for EST recently but had to cancel secondary to URI (now resolved). Pain described as pressure without radiation, n/v or diaphoresis.   Neuro/Psych negative neurological ROS  negative psych ROS   GI/Hepatic negative GI ROS, Neg liver ROS, Pancreatic insufficiency   Endo/Other  negative endocrine ROS  Renal/GU negative Renal ROS     Musculoskeletal negative musculoskeletal ROS (+)   Abdominal (+) + obese,   Peds  Hematology negative hematology ROS (+)   Anesthesia Other Findings   Reproductive/Obstetrics negative OB ROS                           Anesthesia Physical  Anesthesia Plan  ASA: III  Anesthesia Plan: MAC   Post-op Pain Management:    Induction: Intravenous  Airway Management Planned: Nasal Cannula  Additional Equipment:   Intra-op Plan:   Post-operative Plan:   Informed Consent: I have reviewed the patients History and Physical, chart, labs and discussed the procedure including the risks, benefits and alternatives for the proposed anesthesia with the patient or authorized representative who has indicated his/her understanding and acceptance.   Dental advisory given  Plan Discussed with: CRNA  Anesthesia Plan Comments:         Anesthesia Quick Evaluation

## 2013-01-01 NOTE — H&P (Signed)
[facility] History and Physical   Chief Complaint: abdominal pain  HPI: [name] is an 57 y.o. female with longstanding intermittent diarrhea and left upper quadrant abdominal discomfort.  Symptoms improved with pancreatic enzymes.  CT for similar symptoms one year ago unrevealing.  She has no weight loss or other alarm signs.  Past Medical History  Diagnosis Date  . Pancreatic insufficiency   . Hypertension   . Heart disorder     Heart Muscle Spasms  . GERD (gastroesophageal reflux disease)     Past Surgical History  Procedure Laterality Date  . Cesarean section    . Coronary angioplasty  2010  . Nasal sinus surgery    . Tubal ligation  2001    Medications Prior to Admission  Medication Sig Dispense Refill  . amLODipine (NORVASC) 5 MG tablet Take 5 mg by mouth daily before breakfast.       . aspirin EC 81 MG tablet Take 81 mg by mouth daily.      Marland Kitchen losartan (COZAAR) 100 MG tablet Take 100 mg by mouth daily before breakfast.       . metoprolol tartrate (LOPRESSOR) 25 MG tablet Take 12.5 mg by mouth 2 (two) times daily.       . Multiple Vitamins-Minerals (MULTIVITAMIN WITH MINERALS) tablet Take 1 tablet by mouth daily.      . Pancrelipase, Lip-Prot-Amyl, (CREON) 24000 UNITS CPEP Take 1-2 capsules by mouth as directed. Takes 2 capsules with meals and 1 capsule with snacks.      Marland Kitchen ibuprofen (ADVIL,MOTRIN) 200 MG tablet Take 200 mg by mouth every 6 (six) hours as needed. Pain        Allergies:  Allergies  Allergen Reactions  . Tylenol (Acetaminophen) Other (See Comments)    Makes her feel jumpy.    History reviewed. No pertinent family history.  Social History:  reports that she has never smoked. She has never used smokeless tobacco. She reports that  drinks alcohol. She reports that she does not use illicit drugs.   ROS: As per HPI, all others negative.   Blood pressure 126/80, temperature 97.9 F (36.6 C), temperature source Oral, resp. rate 16,  height 5\' 2"  (1.575 m), weight 77.111 kg (170 lb), SpO2 99.00%. GEN:  NAD ABD:  Soft  No results found for this or any previous visit (from the past 48 hour(s)). No results found.  Assessment/Plan 1.  Abdominal pain.  Diarrhea.  Improvement with pancreatic enzymes.  Will plan on endoscopy (EGD) as well as endoscopic ultrasound (with fine needle aspiration biopsies, as needed).   2.  Risks (bleeding, infection, bowel perforation that could require surgery, sedation-related changes in cardiopulmonary systems), benefits (identification and possible treatment of source of symptoms, exclusion of certain causes of symptoms), and alternatives (watchful waiting, radiographic imaging studies, empiric medical treatment) of upper endoscopy with ultrasound and possible biopsies (EGD + EUS +/- FNA) were explained to patient in detail and patient wishes to proceed.  Freddy Jaksch 01/01/2013, 8:13 AM

## 2013-01-01 NOTE — Progress Notes (Signed)
Patient did void prior to being discharged.

## 2013-01-02 ENCOUNTER — Encounter (HOSPITAL_COMMUNITY): Payer: Self-pay | Admitting: Gastroenterology

## 2014-06-30 ENCOUNTER — Ambulatory Visit: Payer: Managed Care, Other (non HMO) | Admitting: Cardiology

## 2014-07-22 ENCOUNTER — Encounter: Payer: Self-pay | Admitting: Cardiology

## 2014-08-27 ENCOUNTER — Encounter (HOSPITAL_COMMUNITY): Payer: Self-pay | Admitting: Gastroenterology

## 2014-11-10 ENCOUNTER — Encounter (HOSPITAL_COMMUNITY): Payer: Self-pay | Admitting: *Deleted

## 2014-11-10 ENCOUNTER — Emergency Department (HOSPITAL_COMMUNITY)
Admission: EM | Admit: 2014-11-10 | Discharge: 2014-11-11 | Disposition: A | Discharge status: Home / Self Care | Payer: Managed Care, Other (non HMO) | Attending: Emergency Medicine | Admitting: Emergency Medicine

## 2014-11-10 ENCOUNTER — Emergency Department (HOSPITAL_COMMUNITY): Payer: Managed Care, Other (non HMO)

## 2014-11-10 DIAGNOSIS — Z7982 Long term (current) use of aspirin: Secondary | ICD-10-CM | POA: Insufficient documentation

## 2014-11-10 DIAGNOSIS — R0789 Other chest pain: Secondary | ICD-10-CM | POA: Insufficient documentation

## 2014-11-10 DIAGNOSIS — I10 Essential (primary) hypertension: Secondary | ICD-10-CM | POA: Diagnosis not present

## 2014-11-10 DIAGNOSIS — R079 Chest pain, unspecified: Secondary | ICD-10-CM | POA: Diagnosis present

## 2014-11-10 DIAGNOSIS — K868 Other specified diseases of pancreas: Secondary | ICD-10-CM | POA: Diagnosis not present

## 2014-11-10 DIAGNOSIS — Z79899 Other long term (current) drug therapy: Secondary | ICD-10-CM | POA: Diagnosis not present

## 2014-11-10 LAB — I-STAT TROPONIN, ED
TROPONIN I, POC: 0 ng/mL (ref 0.00–0.08)
Troponin i, poc: 0 ng/mL (ref 0.00–0.08)

## 2014-11-10 LAB — CBC
HEMATOCRIT: 38.2 % (ref 36.0–46.0)
Hemoglobin: 12.6 g/dL (ref 12.0–15.0)
MCH: 26.3 pg (ref 26.0–34.0)
MCHC: 33 g/dL (ref 30.0–36.0)
MCV: 79.6 fL (ref 78.0–100.0)
Platelets: 217 10*3/uL (ref 150–400)
RBC: 4.8 MIL/uL (ref 3.87–5.11)
RDW: 14.1 % (ref 11.5–15.5)
WBC: 7.1 10*3/uL (ref 4.0–10.5)

## 2014-11-10 LAB — BASIC METABOLIC PANEL
ANION GAP: 8 (ref 5–15)
BUN: 15 mg/dL (ref 6–23)
CHLORIDE: 108 mmol/L (ref 96–112)
CO2: 25 mmol/L (ref 19–32)
CREATININE: 0.74 mg/dL (ref 0.50–1.10)
Calcium: 9 mg/dL (ref 8.4–10.5)
GFR calc non Af Amer: >90 mL/min (ref >90)
GLUCOSE: 113 mg/dL — AB (ref 70–99)
POTASSIUM: 3.5 mmol/L (ref 3.5–5.1)
Sodium: 141 mmol/L (ref 135–145)

## 2014-11-10 MED ORDER — GI COCKTAIL ~~LOC~~
30.0000 mL | Freq: Once | ORAL | Status: AC
  Administered 2014-11-10: 30 mL via ORAL
  Filled 2014-11-10: qty 30

## 2014-11-10 NOTE — ED Notes (Signed)
Pt reports not feeling well today, began having left side shoulder/chest/neck pain. Denies sob or nausea.

## 2014-11-10 NOTE — ED Provider Notes (Signed)
CSN: [id number]     Arrival date & time 11/10/14  1851 History   First MD Initiated Contact with Patient 11/10/14 2144     Chief Complaint  Patient presents with  . Chest Pain     (Consider location/radiation/quality/duration/timing/severity/associated sxs/prior Treatment) Patient is a 59 y.o. female presenting with chest pain.  Chest Pain Pain location:  L chest (and right neck / shoulder / back) Pain quality comment:  Aching and "uneasiness" Pain severity:  Moderate Onset quality:  Gradual Duration:  2 hours Timing:  Constant Progression:  Resolved Chronicity:  New Context: at rest   Relieved by:  None tried Worsened by:  Nothing tried Ineffective treatments:  None tried Associated symptoms: no cough, no fever, no nausea, no shortness of breath, not vomiting and no weakness   Risk factors: hypertension   Risk factors: no coronary artery disease and no diabetes mellitus     Past Medical History  Diagnosis Date  . Pancreatic insufficiency   . Hypertension   . Heart disorder     Heart Muscle Spasms  . GERD (gastroesophageal reflux disease)    Past Surgical History  Procedure Laterality Date  . Cesarean section    . Coronary angioplasty  2010  . Nasal sinus surgery    . Tubal ligation  2001  . Esophagogastroduodenoscopy (egd) with propofol N/A 01/01/2013    Procedure: ESOPHAGOGASTRODUODENOSCOPY (EGD) WITH PROPOFOL;  Surgeon: Willis ModenaWilliam Outlaw, MD;  Location: [facility];  Service: Endoscopy;  Laterality: N/A;  . Eus N/A 01/01/2013    Procedure: ESOPHAGEAL ENDOSCOPIC ULTRASOUND (EUS) RADIAL;  Surgeon: Willis ModenaWilliam Outlaw, MD;  Location: [facility];  Service: Endoscopy;  Laterality: N/A;   History reviewed. No pertinent family history. History  Substance Use Topics  . Smoking status: Never Smoker   . Smokeless tobacco: Never Used  . Alcohol Use: Yes     Comment: occassionally   OB History    No data available     Review of Systems  Constitutional: Negative for fever.   Respiratory: Negative for cough and shortness of breath.   Cardiovascular: Positive for chest pain.  Gastrointestinal: Negative for nausea and vomiting.  Neurological: Negative for weakness.  All other systems reviewed and are negative.     Allergies  Tylenol  Home Medications   Prior to Admission medications   Medication Sig Start Date End Date Taking? Authorizing Provider  amLODipine (NORVASC) 5 MG tablet Take 5 mg by mouth daily before breakfast.    Yes Historical Provider, MD  aspirin EC 81 MG tablet Take 81 mg by mouth daily.   Yes Historical Provider, MD  losartan (COZAAR) 100 MG tablet Take 100 mg by mouth daily before breakfast.    Yes Historical Provider, MD  metoprolol tartrate (LOPRESSOR) 25 MG tablet Take 12.5 mg by mouth 2 (two) times daily.    Yes Historical Provider, MD  Multiple Vitamins-Minerals (MULTIVITAMIN WITH MINERALS) tablet Take 1 tablet by mouth daily.   Yes Historical Provider, MD  Pancrelipase, Lip-Prot-Amyl, (CREON) 24000 UNITS CPEP Take 1-2 capsules by mouth as directed. Takes 2 capsules with meals and 1 capsule with snacks.   Yes Historical Provider, MD   BP 117/75 mmHg  Pulse 71  Temp(Src) 98.3 F (36.8 C) (Oral)  Resp 14  Ht 5\' 2"  (1.575 m)  Wt 180 lb (81.647 kg)  BMI 32.91 kg/m2  SpO2 97% Physical Exam  Constitutional: She appears well-developed and well-nourished. No distress.  HENT:  Head: Normocephalic and atraumatic.  Mouth/Throat: No oropharyngeal exudate.  Eyes: EOM are normal. Pupils are equal, round, and reactive to light.  Cardiovascular: Normal rate, regular rhythm, normal heart sounds and intact distal pulses.  Exam reveals no gallop and no friction rub.   No murmur heard. Pulmonary/Chest: Effort normal and breath sounds normal. No respiratory distress. She has no wheezes. She has no rales.  Abdominal: Soft. She exhibits no distension. There is no tenderness.  Musculoskeletal: Normal range of motion. She exhibits no edema.   Skin: Skin is warm and dry. No rash noted. She is not diaphoretic.  Psychiatric: She has a normal mood and affect. Her behavior is normal. Judgment and thought content normal.  Nursing note and vitals reviewed.   ED Course  Procedures (including critical care time) Labs Review Labs Reviewed  BASIC METABOLIC PANEL - Abnormal; Notable for the following:    Glucose, Bld 113 (*)    All other components within normal limits  CBC  I-STAT TROPOININ, ED  Rosezena Sensor, ED    Imaging Review Dg Chest 2 View  11/10/2014   CLINICAL DATA:  Left-sided chest pain. Shortness of breath. Right shoulder pain.  EXAM: CHEST  2 VIEW  COMPARISON:  03/16/2012  FINDINGS: The heart size and mediastinal contours are within normal limits. Both lungs are clear. The visualized skeletal structures are unremarkable.  IMPRESSION: Normal exam.   Electronically Signed   By: Francene Boyers M.D.   On: 11/10/2014 20:14     EKG Interpretation   Date/Time:  Tuesday November 10 2014 19:00:35 EDT Ventricular Rate:  77 PR Interval:  158 QRS Duration: 84 QT Interval:  360 QTC Calculation: 407 R Axis:   61 Text Interpretation:  Normal sinus rhythm Nonspecific ST abnormality  Abnormal ECG No significant change since last tracing Confirmed by  Ethelda Chick  MD, SAM [phone]) on 11/10/2014 7:07:07 PM      MDM   Final diagnoses:  Atypical chest pain   59 year old female with a past medical history of hypertension and "heart spasms" according to the patient presents with neck and back as well as right arm pain. This occurred at rest at home. No known inciting factors. No associated shortness of breath, nausea, diaphoresis. She states that she then developed a "uneasiness" across her heart. She is unable to describe it further other than a discomfort. This was at 5:00 PM and lasted for a couple of hours and then resolved.  Pain-free on arrival. EKG shows normal sinus rhythm without ischemic changes and unchanged from prior.  Chest x-ray clear. Blood work including troponin negative. Unclear etiology to the patient's symptoms but do not appear to be cardiac in etiology. History is not concerning for pulmonary embolus and history and exam as well as chest x-ray did not concerning for dissection. She is symptom-free on arrival. We will obtain a delta troponin and if negative she should be stable for outpatient follow-up with a cardiologist or PCP.   Dorna Leitz, MD 11/10/14 6045  Blake Divine, MD 11/11/14 [phone]

## 2014-11-10 NOTE — Discharge Instructions (Signed)

## 2014-11-11 NOTE — ED Notes (Signed)
Pt stable, ambulatory, denies any pain.   

## 2014-11-28 ENCOUNTER — Ambulatory Visit (INDEPENDENT_AMBULATORY_CARE_PROVIDER_SITE_OTHER): Payer: Managed Care, Other (non HMO) | Admitting: Emergency Medicine

## 2014-11-28 VITALS — BP 120/80 | HR 69 | Temp 98.1°F | Resp 16 | Ht 61.0 in | Wt 183.0 lb

## 2014-11-28 DIAGNOSIS — B852 Pediculosis, unspecified: Secondary | ICD-10-CM

## 2014-11-28 LAB — POCT UA - MICROSCOPIC ONLY
AMORPHOUS: POSITIVE
Bacteria, U Microscopic: NEGATIVE
CRYSTALS, UR, HPF, POC: NEGATIVE
Casts, Ur, LPF, POC: NEGATIVE
Mucus, UA: NEGATIVE
WBC, Ur, HPF, POC: NEGATIVE
Yeast, UA: NEGATIVE

## 2014-11-28 LAB — POCT URINALYSIS DIPSTICK
Bilirubin, UA: NEGATIVE
Glucose, UA: NEGATIVE
KETONES UA: NEGATIVE
Leukocytes, UA: NEGATIVE
NITRITE UA: NEGATIVE
PROTEIN UA: NEGATIVE
Spec Grav, UA: 1.02
UROBILINOGEN UA: 0.2
pH, UA: 7

## 2014-11-28 MED ORDER — PERMETHRIN 1 % EX LOTN: 1.0000 "application " | TOPICAL_LOTION | Freq: Once | CUTANEOUS | Status: DC

## 2014-11-28 MED ORDER — PERMETHRIN 0.5 % AERO: INHALATION_SPRAY | Status: DC

## 2014-11-28 NOTE — Progress Notes (Signed)
[facility] [address], [place] [address] [phone]  Date:  11/28/2014   Name:  Shari CageVirginia M Miller   DOB:  Apr 11, 1956   MRN:  [id number]  PCP:   Duane Lopeoss, Alan, MD    Chief Complaint: Pruritis   History of Present Illness:  Shari CageVirginia M Miller is a 59 y.o. very pleasant female patient who presents with the following:  Multiple complaints Thinks she has lice.   Says her scalp is itchy and she has biting "lice" in her carpets and in her bed She says her boyfriend noted crawling insects in her scalp She has no pets and no history of flea infestation She also is complaining of dysuria, urgency and frequency No fever or chills.    No nausea or vomiting or back pain No improvement with over the counter medications or other home remedies.  Denies other complaint or health concern today.   There are no active problems to display for this patient.   Past Medical History  Diagnosis Date  . Pancreatic insufficiency   . Hypertension   . Heart disorder     Heart Muscle Spasms  . GERD (gastroesophageal reflux disease)     Past Surgical History  Procedure Laterality Date  . Cesarean section    . Coronary angioplasty  2010  . Nasal sinus surgery    . Tubal ligation  2001  . Esophagogastroduodenoscopy (egd) with propofol N/A 01/01/2013    Procedure: ESOPHAGOGASTRODUODENOSCOPY (EGD) WITH PROPOFOL;  Surgeon: Willis ModenaWilliam Outlaw, MD;  Location: [facility];  Service: Endoscopy;  Laterality: N/A;  . Eus N/A 01/01/2013    Procedure: ESOPHAGEAL ENDOSCOPIC ULTRASOUND (EUS) RADIAL;  Surgeon: Willis ModenaWilliam Outlaw, MD;  Location: [facility];  Service: Endoscopy;  Laterality: N/A;    History  Substance Use Topics  . Smoking status: Never Smoker   . Smokeless tobacco: Never Used  . Alcohol Use: Yes     Comment: occassionally    Family History  Problem Relation Age of Onset  . Diabetes Father   . Heart disease Father   . Hyperlipidemia Father     Allergies   Allergen Reactions  . Tylenol [Acetaminophen] Other (See Comments)    Makes her feel jumpy.    Medication list has been reviewed and updated.  Current Outpatient Prescriptions on File Prior to Visit  Medication Sig Dispense Refill  . amLODipine (NORVASC) 5 MG tablet Take 5 mg by mouth daily before breakfast.     . aspirin EC 81 MG tablet Take 81 mg by mouth daily.    Marland Kitchen. losartan (COZAAR) 100 MG tablet Take 100 mg by mouth daily before breakfast.     . metoprolol tartrate (LOPRESSOR) 25 MG tablet Take 12.5 mg by mouth 2 (two) times daily.     . Multiple Vitamins-Minerals (MULTIVITAMIN WITH MINERALS) tablet Take 1 tablet by mouth daily.    . Pancrelipase, Lip-Prot-Amyl, (CREON) 24000 UNITS CPEP Take 1-2 capsules by mouth as directed. Takes 2 capsules with meals and 1 capsule with snacks.     No current facility-administered medications on file prior to visit.    Review of Systems:  As per HPI, otherwise negative.    Physical Examination: Filed Vitals:   11/28/14 1012  BP: 120/80  Pulse: 69  Temp: 98.1 F (36.7 C)  Resp: 16   Filed Vitals:   11/28/14 1012  Height: 5\' 1"  (1.549 m)  Weight: 183 lb (83.008 kg)   Body mass index is 34.6 kg/(m^2). Ideal Body Weight:  Weight in (lb) to have BMI = 25: 132  GEN: WDWN, NAD, Non-toxic, A & O x 3 HEENT: Atraumatic, Normocephalic. Neck supple. No masses, No LAD. Ears and Nose: No external deformity. CV: RRR, No M/G/R. No JVD. No thrill. No extra heart sounds. PULM: CTA B, no wheezes, crackles, rhonchi. No retractions. No resp. distress. No accessory muscle use. ABD: S, NT, ND, +BS. No rebound. No HSM. EXTR: No c/c/e NEURO Normal gait.  PSYCH: Normally interactive. Conversant. Not depressed or anxious appearing.  Calm demeanor.    Assessment and Plan: Concerned she may have body lice Rid permethrin  Signed,  Phillips Odor, MD   Results for orders placed or performed in visit on 11/28/14  POCT urinalysis dipstick   Result Value Ref Range   Color, UA yellow\    Clarity, UA clear    Glucose, UA neg    Bilirubin, UA neg    Ketones, UA neg    Spec Grav, UA 1.020    Blood, UA trace-intact    pH, UA 7.0    Protein, UA neg    Urobilinogen, UA 0.2    Nitrite, UA neg    Leukocytes, UA Negative   POCT UA - Microscopic Only  Result Value Ref Range   WBC, Ur, HPF, POC neg    RBC, urine, microscopic 0-2    Bacteria, U Microscopic neg    Mucus, UA neg    Epithelial cells, urine per micros 0-3    Crystals, Ur, HPF, POC neg    Casts, Ur, LPF, POC neg    Yeast, UA neg    Amorphous positive

## 2014-11-28 NOTE — Patient Instructions (Signed)
Head and Pubic Lice °Lice are tiny, light brown insects with claws on the ends of their legs. They are small parasites that live on the human body. Lice often make their home in your hair. They hatch from little round eggs (nits), which are attached to the base of hairs. They spread by: °· Direct contact with an infested person. °· Infested personal items such as combs, brushes, towels, clothing, pillow cases and sheets. °The parasite that causes your condition may also live in clothes which have been worn within the week before treatment. Therefore, it is necessary to wash your clothes, bed linens, towels, combs and brushes. Any woolens can be put in an air-tight plastic bag for one week. You need to use fresh clothes, towels and sheets after your treatment is completed. Re-treatment is usually not necessary if instructions are followed. If necessary, treatment may be repeated in 7 days. The entire family may require treatment. Sexual partners should be treated if the nits are present in the pubic area. °TREATMENT °· Apply enough medicated shampoo or cream to wet hair and skin in and around the infected areas. °· Work thoroughly into hair and leave in according to instructions. °· Add a small amount of water until a good lather forms. °· Rinse thoroughly. °· Towel briskly. °· When hair is dry, any remaining nits, cream or shampoo may be removed with a fine-tooth comb or tweezers. The nits resemble dandruff; however they are glued to the hair follicle and are difficult to brush out. Frequent fine combing and shampoos are necessary. A towel soaked in white vinegar and left on the hair for 2 hours will also help soften the glue which holds the nits on the hair. °Medicated shampoo or cream should not be used on children or pregnant women without a caregiver's prescription or instructions. °SEEK MEDICAL CARE IF:  °· You or your child develops sores that look infected. °· The rash does not go away in one week. °· The  lice or nits return or persist in spite of treatment. °Document Released: 07/31/2005 Document Revised: 10/23/2011 Document Reviewed: 02/27/2007 °ExitCare® Patient Information ©2015 [employer]. This information is not intended to replace advice given to you by your health care provider. Make sure you discuss any questions you have with your health care provider. ° °

## 2015-01-06 ENCOUNTER — Encounter (HOSPITAL_COMMUNITY): Payer: Self-pay | Admitting: Emergency Medicine

## 2015-01-06 ENCOUNTER — Emergency Department (HOSPITAL_COMMUNITY)
Admission: EM | Admit: 2015-01-06 | Discharge: 2015-01-06 | Disposition: A | Discharge status: Home / Self Care | Payer: Managed Care, Other (non HMO) | Attending: Emergency Medicine | Admitting: Emergency Medicine

## 2015-01-06 DIAGNOSIS — I10 Essential (primary) hypertension: Secondary | ICD-10-CM | POA: Diagnosis not present

## 2015-01-06 DIAGNOSIS — H578 Other specified disorders of eye and adnexa: Secondary | ICD-10-CM | POA: Diagnosis not present

## 2015-01-06 DIAGNOSIS — J392 Other diseases of pharynx: Secondary | ICD-10-CM

## 2015-01-06 DIAGNOSIS — Z8719 Personal history of other diseases of the digestive system: Secondary | ICD-10-CM | POA: Insufficient documentation

## 2015-01-06 DIAGNOSIS — H5789 Other specified disorders of eye and adnexa: Secondary | ICD-10-CM

## 2015-01-06 DIAGNOSIS — R0989 Other specified symptoms and signs involving the circulatory and respiratory systems: Secondary | ICD-10-CM | POA: Insufficient documentation

## 2015-01-06 DIAGNOSIS — Z77098 Contact with and (suspected) exposure to other hazardous, chiefly nonmedicinal, chemicals: Secondary | ICD-10-CM | POA: Diagnosis not present

## 2015-01-06 DIAGNOSIS — Z7982 Long term (current) use of aspirin: Secondary | ICD-10-CM | POA: Insufficient documentation

## 2015-01-06 DIAGNOSIS — Z79899 Other long term (current) drug therapy: Secondary | ICD-10-CM | POA: Diagnosis not present

## 2015-01-06 NOTE — ED Notes (Signed)
Pt left prior to dc instructions.
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
## 2015-01-06 NOTE — ED Notes (Signed)
Pt A+Ox4, reports walked into a room with a "fogger" approx 1hr PTA in ED.  Pt now c/o bilat eye irritation and throat burning sensation.  Pt denies visual changes.  Pt denies difficulty breathing/clearing secretions.  Speaking full/clear sentences, rr even/un-lab.  Skin PWD.  MAEI.  Ambulatory with steady gait.  NAD.

## 2015-01-06 NOTE — ED Notes (Addendum)
Eyes irrigated with saline. Pt reports drinking 2 glasses milk of milk "to rinse the rest away"

## 2015-08-06 ENCOUNTER — Ambulatory Visit
Admission: RE | Admit: 2015-08-06 | Discharge: 2015-08-06 | Disposition: A | Discharge status: Home / Self Care | Payer: Managed Care, Other (non HMO) | Source: Ambulatory Visit | Attending: Cardiovascular Disease | Admitting: Cardiovascular Disease

## 2015-08-06 ENCOUNTER — Other Ambulatory Visit: Payer: Self-pay | Admitting: Cardiovascular Disease

## 2015-08-06 DIAGNOSIS — R0602 Shortness of breath: Secondary | ICD-10-CM

## 2015-12-04 ENCOUNTER — Emergency Department (HOSPITAL_COMMUNITY)
Admission: EM | Admit: 2015-12-04 | Discharge: 2015-12-05 | Disposition: A | Discharge status: Home / Self Care | Payer: Managed Care, Other (non HMO) | Attending: Emergency Medicine | Admitting: Emergency Medicine

## 2015-12-04 ENCOUNTER — Encounter (HOSPITAL_COMMUNITY): Payer: Self-pay | Admitting: Emergency Medicine

## 2015-12-04 ENCOUNTER — Emergency Department (HOSPITAL_COMMUNITY): Payer: Managed Care, Other (non HMO)

## 2015-12-04 DIAGNOSIS — Z79899 Other long term (current) drug therapy: Secondary | ICD-10-CM | POA: Insufficient documentation

## 2015-12-04 DIAGNOSIS — R079 Chest pain, unspecified: Secondary | ICD-10-CM | POA: Diagnosis not present

## 2015-12-04 DIAGNOSIS — Z7982 Long term (current) use of aspirin: Secondary | ICD-10-CM | POA: Insufficient documentation

## 2015-12-04 DIAGNOSIS — K8689 Other specified diseases of pancreas: Secondary | ICD-10-CM | POA: Insufficient documentation

## 2015-12-04 DIAGNOSIS — Z9861 Coronary angioplasty status: Secondary | ICD-10-CM | POA: Diagnosis not present

## 2015-12-04 DIAGNOSIS — R42 Dizziness and giddiness: Secondary | ICD-10-CM | POA: Insufficient documentation

## 2015-12-04 DIAGNOSIS — I10 Essential (primary) hypertension: Secondary | ICD-10-CM | POA: Insufficient documentation

## 2015-12-04 DIAGNOSIS — K219 Gastro-esophageal reflux disease without esophagitis: Secondary | ICD-10-CM | POA: Insufficient documentation

## 2015-12-04 LAB — CBC
HCT: 39.5 % (ref 36.0–46.0)
Hemoglobin: 12.6 g/dL (ref 12.0–15.0)
MCH: 25.2 pg — AB (ref 26.0–34.0)
MCHC: 31.9 g/dL (ref 30.0–36.0)
MCV: 79 fL (ref 78.0–100.0)
PLATELETS: 233 10*3/uL (ref 150–400)
RBC: 5 MIL/uL (ref 3.87–5.11)
RDW: 14.4 % (ref 11.5–15.5)
WBC: 7.8 10*3/uL (ref 4.0–10.5)

## 2015-12-04 LAB — BASIC METABOLIC PANEL
Anion gap: 11 (ref 5–15)
BUN: 17 mg/dL (ref 6–20)
CALCIUM: 9.5 mg/dL (ref 8.9–10.3)
CO2: 23 mmol/L (ref 22–32)
CREATININE: 0.74 mg/dL (ref 0.44–1.00)
Chloride: 108 mmol/L (ref 101–111)
GFR calc Af Amer: >60 mL/min (ref >60)
GFR calc non Af Amer: >60 mL/min (ref >60)
GLUCOSE: 104 mg/dL — AB (ref 65–99)
POTASSIUM: 3.8 mmol/L (ref 3.5–5.1)
SODIUM: 142 mmol/L (ref 135–145)

## 2015-12-04 LAB — I-STAT TROPONIN, ED: TROPONIN I, POC: 0 ng/mL (ref 0.00–0.08)

## 2015-12-04 NOTE — ED Provider Notes (Signed)
CSN: [id number]     Arrival date & time 12/04/15  1932 History  By signing my name below, I, Doreatha Martin, attest that this documentation has been prepared under the direction and in the presence of Dione Booze, MD. Electronically Signed: Doreatha Martin, ED Scribe. 12/05/2015. 12:03 AM.    Chief Complaint  Patient presents with  . Chest Pain   The history is provided by the patient. No language interpreter was used.   HPI Comments: [name] is a 60 y.o. female with h/o HTN on Losartan and Amlodipine, GERD who presents to the Emergency Department complaining of moderate, burning 7/10 chest pain onset tonight at 6pm while at rest with associated dizziness. She also notes transient right arm pain and diaphoresis with initial onset of pain that has now resolved. Pt states she took 3 ASA with mild to moderate relief of pain. No worsening factors noted. Pt reports h/o similar symptoms 1 year ago with no official diagnosis. She also reports her symptoms feel similar to heart burn. H/o stress test 3 years ago. Denies SOB, nausea, leg swelling. Pt is a non-smoker and non-drinker. No h/o DM, HLD. No FHx of CAD/CHF or MI <55yo. PCP Dr. Gildardo Cranker, Cardiologist Dr. Algie Coffer.   Past Medical History  Diagnosis Date  . Pancreatic insufficiency (HCC)   . Hypertension   . Heart disorder     Heart Muscle Spasms  . GERD (gastroesophageal reflux disease)    Past Surgical History  Procedure Laterality Date  . Cesarean section    . Coronary angioplasty  2010  . Nasal sinus surgery    . Tubal ligation  2001  . Esophagogastroduodenoscopy (egd) with propofol N/A 01/01/2013    Procedure: ESOPHAGOGASTRODUODENOSCOPY (EGD) WITH PROPOFOL;  Surgeon: Willis Modena, MD;  Location: [facility];  Service: Endoscopy;  Laterality: N/A;  . Eus N/A 01/01/2013    Procedure: ESOPHAGEAL ENDOSCOPIC ULTRASOUND (EUS) RADIAL;  Surgeon: Willis Modena, MD;  Location: [facility];  Service: Endoscopy;  Laterality: N/A;    Family History  Problem Relation Age of Onset  . Diabetes Father   . Heart disease Father   . Hyperlipidemia Father    Social History  Substance Use Topics  . Smoking status: Never Smoker   . Smokeless tobacco: Never Used  . Alcohol Use: Yes     Comment: occassionally   OB History    No data available     Review of Systems  Constitutional: Positive for diaphoresis (resolved).  Respiratory: Negative for shortness of breath.   Cardiovascular: Positive for chest pain. Negative for leg swelling.  Gastrointestinal: Negative for nausea.  Musculoskeletal: Positive for myalgias (right arm- resolved).  Neurological: Positive for dizziness.  All other systems reviewed and are negative.  Allergies  Tylenol  Home Medications   Prior to Admission medications   Medication Sig Start Date End Date Taking? Authorizing Provider  amLODipine (NORVASC) 5 MG tablet Take 5 mg by mouth daily before breakfast.    Yes Historical Provider, MD  aspirin EC 81 MG tablet Take 81 mg by mouth daily.   Yes Historical Provider, MD  levothyroxine (SYNTHROID, LEVOTHROID) 50 MCG tablet Take 50 mcg by mouth daily before breakfast.   Yes Historical Provider, MD  losartan (COZAAR) 100 MG tablet Take 100 mg by mouth daily before breakfast.    Yes Historical Provider, MD  Multiple Vitamins-Minerals (MULTIVITAMIN WITH MINERALS) tablet Take 1 tablet by mouth daily.   Yes Historical Provider, MD  Pancrelipase, Lip-Prot-Amyl, (CREON) 24000 UNITS CPEP Take  1-2 capsules by mouth as directed. Takes 2 capsules with meals and 1 capsule with snacks.   Yes Historical Provider, MD  permethrin (PERMETHRIN LICE TREATMENT) 1 % lotion Apply 1 application topically once. Shampoo, rinse and towel dry hair, saturate hair and scalp with permethrin. Rinse after 10 min; repeat in 1 week if needed Patient not taking: Reported on 12/04/2015 11/28/14   Carmelina DaneJeffery S Anderson, MD  pyrethrins-piperonyl butoxide 0.5 % bottle Apply chin to toes and  allow to dry.  Wash off in 10 hours Patient not taking: Reported on 12/04/2015 11/28/14   Carmelina DaneJeffery S Anderson, MD   BP 134/91 mmHg  Pulse 88  Temp(Src) 97.8 F (36.6 C) (Oral)  Resp 18  Ht 5\' 2"  (1.575 m)  Wt 195 lb 3 oz (88.536 kg)  BMI 35.69 kg/m2  SpO2 96% Physical Exam  Constitutional: She is oriented to person, place, and time. She appears well-developed and well-nourished.  HENT:  Head: Normocephalic and atraumatic.  Eyes: Conjunctivae are normal. Pupils are equal, round, and reactive to light.  Neck: Normal range of motion. Neck supple. No JVD present.  Cardiovascular: Normal rate, regular rhythm and normal heart sounds.  Exam reveals no gallop and no friction rub.   No murmur heard. Pulmonary/Chest: Effort normal and breath sounds normal. She has no wheezes. She has no rales. She exhibits no tenderness.  Abdominal: Soft. Bowel sounds are normal. She exhibits no distension and no mass. There is no tenderness.  Musculoskeletal: Normal range of motion. She exhibits no edema.  No lower extremity edema   Lymphadenopathy:    She has no cervical adenopathy.  Neurological: She is alert and oriented to person, place, and time. No cranial nerve deficit. She exhibits normal muscle tone. Coordination normal.  Skin: Skin is warm and dry. No rash noted.  Psychiatric: She has a normal mood and affect. Her behavior is normal. Judgment and thought content normal.  Nursing note and vitals reviewed.   ED Course  Procedures (including critical care time) DIAGNOSTIC STUDIES: Oxygen Saturation is 96% on RA, adequate by my interpretation.    COORDINATION OF CARE: 12:01 AM Discussed treatment plan with pt at bedside which includes lab work, CXR, EXG and pt agreed to plan.   Labs Review Results for orders placed or performed during the hospital encounter of 12/04/15  Basic metabolic panel  Result Value Ref Range   Sodium 142 135 - 145 mmol/L   Potassium 3.8 3.5 - 5.1 mmol/L   Chloride 108  101 - 111 mmol/L   CO2 23 22 - 32 mmol/L   Glucose, Bld 104 (H) 65 - 99 mg/dL   BUN 17 6 - 20 mg/dL   Creatinine, Ser 1.610.74 0.44 - 1.00 mg/dL   Calcium 9.5 8.9 - 09.610.3 mg/dL   GFR calc non Af Amer >60 >60 mL/min   GFR calc Af Amer >60 >60 mL/min   Anion gap 11 5 - 15  CBC  Result Value Ref Range   WBC 7.8 4.0 - 10.5 K/uL   RBC 5.00 3.87 - 5.11 MIL/uL   Hemoglobin 12.6 12.0 - 15.0 g/dL   HCT 04.539.5 40.936.0 - 81.146.0 %   MCV 79.0 78.0 - 100.0 fL   MCH 25.2 (L) 26.0 - 34.0 pg   MCHC 31.9 30.0 - 36.0 g/dL   RDW 91.414.4 78.211.5 - 95.615.5 %   Platelets 233 150 - 400 K/uL  I-stat troponin, ED  Result Value Ref Range   Troponin i, poc 0.00 0.00 -  0.08 ng/mL   Comment 3          I-stat troponin, ED  Result Value Ref Range   Troponin i, poc 0.00 0.00 - 0.08 ng/mL   Comment 3           Imaging Review Dg Chest 2 View  12/04/2015  CLINICAL DATA:  Chest discomfort EXAM: CHEST  2 VIEW COMPARISON:  08/06/2015 FINDINGS: The heart size and mediastinal contours are within normal limits. Both lungs are clear. The visualized skeletal structures are unremarkable. IMPRESSION: No active cardiopulmonary disease. Electronically Signed   By: Alcide Clever M.D.   On: 12/04/2015 20:28   I have personally reviewed and evaluated these images and lab results as part of my medical decision-making.   EKG Interpretation   Date/Time:  Saturday December 04 2015 19:47:22 EDT Ventricular Rate:  88 PR Interval:  156 QRS Duration: 88 QT Interval:  348 QTC Calculation: 421 R Axis:   49 Text Interpretation:  Normal sinus rhythm Nonspecific ST abnormality  Abnormal ECG When compared with ECG of 11/10/2014, No significant change  was found Confirmed by [facility]  MD, Reyah Streeter ([id number]) on 12/04/2015 11:10:06 PM      MDM   Final diagnoses:  Chest pain, unspecified chest pain type    Chest pain which seems quite atypical. Patient has had similar pains before. Apparently, she is seeing a cardiologist who has diagnosed her with "heart  spasms". She did have a negative stress test several years ago. Old records show an ED visit 1 year ago for similar chest pain which was felt to be GI in our urgent. Her heart score is 2 which returned very remote risk of coronary artery disease. She is given a therapeutic trial of GI cocktail.  She had good relief of GI cocktail. We'll check delta troponin.  Repeat troponin is normal. She is discharged with prescription for pantoprazole and is to follow-up with her cardiologist.  I personally performed the services described in this documentation, which was scribed in my presence. The recorded information has been reviewed and is accurate.     Dione Booze, MD 12/05/15 [phone]

## 2015-12-04 NOTE — ED Notes (Signed)
Pt. reports left chest " discomfort" onset this evening with left forearm pain and dizziness , denies SOB , no nausea or diaphoresis .

## 2015-12-05 LAB — I-STAT TROPONIN, ED: TROPONIN I, POC: 0 ng/mL (ref 0.00–0.08)

## 2015-12-05 MED ORDER — PANTOPRAZOLE SODIUM 40 MG PO TBEC
40.0000 mg | DELAYED_RELEASE_TABLET | Freq: Once | ORAL | Status: AC
  Administered 2015-12-05: 40 mg via ORAL
  Filled 2015-12-05: qty 1

## 2015-12-05 MED ORDER — GI COCKTAIL ~~LOC~~
30.0000 mL | Freq: Once | ORAL | Status: AC
  Administered 2015-12-05: 30 mL via ORAL
  Filled 2015-12-05: qty 30

## 2015-12-05 MED ORDER — PANTOPRAZOLE SODIUM 40 MG PO TBEC: 40.0000 mg | DELAYED_RELEASE_TABLET | Freq: Every day | ORAL | Status: DC

## 2015-12-05 NOTE — ED Notes (Signed)
Discharge instructions and prescription reviewed - voiced understanding.  

## 2015-12-05 NOTE — Discharge Instructions (Signed)
Nonspecific Chest Pain  Chest pain can be caused by many different conditions. There is always a chance that your pain could be related to something serious, such as a heart attack or a blood clot in your lungs. Chest pain can also be caused by conditions that are not life-threatening. If you have chest pain, it is very important to follow up with your health care provider. CAUSES  Chest pain can be caused by:  Heartburn.  Pneumonia or bronchitis.  Anxiety or stress.  Inflammation around your heart (pericarditis) or lung (pleuritis or pleurisy).  A blood clot in your lung.  A collapsed lung (pneumothorax). It can develop suddenly on its own (spontaneous pneumothorax) or from trauma to the chest.  Shingles infection (varicella-zoster virus).  Heart attack.  Damage to the bones, muscles, and cartilage that make up your chest wall. This can include:  Bruised bones due to injury.  Strained muscles or cartilage due to frequent or repeated coughing or overwork.  Fracture to one or more ribs.  Sore cartilage due to inflammation (costochondritis). RISK FACTORS  Risk factors for chest pain may include:  Activities that increase your risk for trauma or injury to your chest.  Respiratory infections or conditions that cause frequent coughing.  Medical conditions or overeating that can cause heartburn.  Heart disease or family history of heart disease.  Conditions or health behaviors that increase your risk of developing a blood clot.  Having had chicken pox (varicella zoster). SIGNS AND SYMPTOMS Chest pain can feel like:  Burning or tingling on the surface of your chest or deep in your chest.  Crushing, pressure, aching, or squeezing pain.  Dull or sharp pain that is worse when you move, cough, or take a deep breath.  Pain that is also felt in your back, neck, shoulder, or arm, or pain that spreads to any of these areas. Your chest pain may come and go, or it may stay  constant. DIAGNOSIS Lab tests or other studies may be needed to find the cause of your pain. Your health care provider may have you take a test called an ambulatory ECG (electrocardiogram). An ECG records your heartbeat patterns at the time the test is performed. You may also have other tests, such as:  Transthoracic echocardiogram (TTE). During echocardiography, sound waves are used to create a picture of all of the heart structures and to look at how blood flows through your heart.  Transesophageal echocardiogram (TEE).This is a more advanced imaging test that obtains images from inside your body. It allows your health care provider to see your heart in finer detail.  Cardiac monitoring. This allows your health care provider to monitor your heart rate and rhythm in real time.  Holter monitor. This is a portable device that records your heartbeat and can help to diagnose abnormal heartbeats. It allows your health care provider to track your heart activity for several days, if needed.  Stress tests. These can be done through exercise or by taking medicine that makes your heart beat more quickly.  Blood tests.  Imaging tests. TREATMENT  Your treatment depends on what is causing your chest pain. Treatment may include:  Medicines. These may include:  Acid blockers for heartburn.  Anti-inflammatory medicine.  Pain medicine for inflammatory conditions.  Antibiotic medicine, if an infection is present.  Medicines to dissolve blood clots.  Medicines to treat coronary artery disease.  Supportive care for conditions that do not require medicines. This may include:  Resting.  Applying heat  or cold packs to injured areas. °· Limiting activities until pain decreases. °HOME CARE INSTRUCTIONS °· If you were prescribed an antibiotic medicine, finish it all even if you start to feel better. °· Avoid any activities that bring on chest pain. °· Do not use any tobacco products, including  cigarettes, chewing tobacco, or electronic cigarettes. If you need help quitting, ask your health care provider. °· Do not drink alcohol. °· Take medicines only as directed by your health care provider. °· Keep all follow-up visits as directed by your health care provider. This is important. This includes any further testing if your chest pain does not go away. °· If heartburn is the cause for your chest pain, you may be told to keep your head raised (elevated) while sleeping. This reduces the chance that acid will go from your stomach into your esophagus. °· Make lifestyle changes as directed by your health care provider. These may include: °¨ Getting regular exercise. Ask your health care provider to suggest some activities that are safe for you. °¨ Eating a heart-healthy diet. A registered dietitian can help you to learn healthy eating options. °¨ Maintaining a healthy weight. °¨ Managing diabetes, if necessary. °¨ Reducing stress. °SEEK MEDICAL CARE IF: °· Your chest pain does not go away after treatment. °· You have a rash with blisters on your chest. °· You have a fever. °SEEK IMMEDIATE MEDICAL CARE IF:  °· Your chest pain is worse. °· You have an increasing cough, or you cough up blood. °· You have severe abdominal pain. °· You have severe weakness. °· You faint. °· You have chills. °· You have sudden, unexplained chest discomfort. °· You have sudden, unexplained discomfort in your arms, back, neck, or jaw. °· You have shortness of breath at any time. °· You suddenly start to sweat, or your skin gets clammy. °· You feel nauseous or you vomit. °· You suddenly feel light-headed or dizzy. °· Your heart begins to beat quickly, or it feels like it is skipping beats. °These symptoms may represent a serious problem that is an emergency. Do not wait to see if the symptoms will go away. Get medical help right away. Call your local emergency services (911 in the [place] Do not drive yourself to the hospital. °  °This  information is not intended to replace advice given to you by your health care provider. Make sure you discuss any questions you have with your health care provider. °  °Document Released: 05/10/2005 Document Revised: 08/21/2014 Document Reviewed: 03/06/2014 °Elsevier Interactive Patient Education ©2016 [employer] ° °Gastroesophageal Reflux Disease, Adult °Normally, food travels down the esophagus and stays in the stomach to be digested. However, when a person has gastroesophageal reflux disease (GERD), food and stomach acid move back up into the esophagus. When this happens, the esophagus becomes sore and inflamed. Over time, GERD can create small holes (ulcers) in the lining of the esophagus.  °CAUSES °This condition is caused by a problem with the muscle between the esophagus and the stomach (lower esophageal sphincter, or LES). Normally, the LES muscle closes after food passes through the esophagus to the stomach. When the LES is weakened or abnormal, it does not close properly, and that allows food and stomach acid to go back up into the esophagus. The LES can be weakened by certain dietary substances, medicines, and medical conditions, including: °· Tobacco use. °· Pregnancy. °· Having a hiatal hernia. °· Heavy alcohol use. °· Certain foods and beverages, such as coffee,   chocolate, onions, and peppermint. °RISK FACTORS °This condition is more likely to develop in: °· People who have an increased body weight. °· People who have connective tissue disorders. °· People who use NSAID medicines. °SYMPTOMS °Symptoms of this condition include: °· Heartburn. °· Difficult or painful swallowing. °· The feeling of having a lump in the throat. °· A bitter taste in the mouth. °· Bad breath. °· Having a large amount of saliva. °· Having an upset or bloated stomach. °· Belching. °· Chest pain. °· Shortness of breath or wheezing. °· Ongoing (chronic) cough or a night-time cough. °· Wearing away of tooth enamel. °· Weight  loss. °Different conditions can cause chest pain. Make sure to see your health care provider if you experience chest pain. °DIAGNOSIS °Your health care provider will take a medical history and perform a physical exam. To determine if you have mild or severe GERD, your health care provider may also monitor how you respond to treatment. You may also have other tests, including: °· An endoscopy to examine your stomach and esophagus with a small camera. °· A test that measures the acidity level in your esophagus. °· A test that measures how much pressure is on your esophagus. °· A barium swallow or modified barium swallow to show the shape, size, and functioning of your esophagus. °TREATMENT °The goal of treatment is to help relieve your symptoms and to prevent complications. Treatment for this condition may vary depending on how severe your symptoms are. Your health care provider may recommend: °· Changes to your diet. °· Medicine. °· Surgery. °HOME CARE INSTRUCTIONS °Diet °· Follow a diet as recommended by your health care provider. This may involve avoiding foods and drinks such as: °¨ Coffee and tea (with or without caffeine). °¨ Drinks that contain alcohol. °¨ Energy drinks and sports drinks. °¨ Carbonated drinks or sodas. °¨ Chocolate and cocoa. °¨ Peppermint and mint flavorings. °¨ Garlic and onions. °¨ Horseradish. °¨ Spicy and acidic foods, including peppers, chili powder, curry powder, vinegar, hot sauces, and barbecue sauce. °¨ Citrus fruit juices and citrus fruits, such as oranges, lemons, and limes. °¨ Tomato-based foods, such as red sauce, chili, salsa, and pizza with red sauce. °¨ Fried and fatty foods, such as donuts, french fries, potato chips, and high-fat dressings. °¨ High-fat meats, such as hot dogs and fatty cuts of red and white meats, such as rib eye steak, sausage, ham, and bacon. °¨ High-fat dairy items, such as whole milk, butter, and cream cheese. °· Eat small, frequent meals instead of large  meals. °· Avoid drinking large amounts of liquid with your meals. °· Avoid eating meals during the 2-3 hours before bedtime. °· Avoid lying down right after you eat. °· Do not exercise right after you eat. ° General Instructions  °· Pay attention to any changes in your symptoms. °· Take over-the-counter and prescription medicines only as told by your health care provider. Do not take aspirin, ibuprofen, or other NSAIDs unless your health care provider told you to do so. °· Do not use any tobacco products, including cigarettes, chewing tobacco, and e-cigarettes. If you need help quitting, ask your health care provider. °· Wear loose-fitting clothing. Do not wear anything tight around your waist that causes pressure on your abdomen. °· Raise (elevate) the head of your bed 6 inches (15cm). °· Try to reduce your stress, such as with yoga or meditation. If you need help reducing stress, ask your health care provider. °· If you are overweight, reduce your weight to an amount that is   healthy for you. Ask your health care provider for guidance about a safe weight loss goal.  Keep all follow-up visits as told by your health care provider. This is important. SEEK MEDICAL CARE IF:  You have new symptoms.  You have unexplained weight loss.  You have difficulty swallowing, or it hurts to swallow.  You have wheezing or a persistent cough.  Your symptoms do not improve with treatment.  You have a hoarse voice. SEEK IMMEDIATE MEDICAL CARE IF:  You have pain in your arms, neck, jaw, teeth, or back.  You feel sweaty, dizzy, or light-headed.  You have chest pain or shortness of breath.  You vomit and your vomit looks like blood or coffee grounds.  You faint.  Your stool is bloody or black.  You cannot swallow, drink, or eat.   This information is not intended to replace advice given to you by your health care provider. Make sure you discuss any questions you have with your health care provider.     Document Released: 05/10/2005 Document Revised: 04/21/2015 Document Reviewed: 11/25/2014 Elsevier Interactive Patient Education 2016 [employer] Inc.  Pantoprazole tablets What is this medicine? PANTOPRAZOLE (pan TOE pra zole) prevents the production of acid in the stomach. It is used to treat gastroesophageal reflux disease (GERD), inflammation of the esophagus, and Zollinger-Ellison syndrome. This medicine may be used for other purposes; ask your health care provider or pharmacist if you have questions. What should I tell my health care provider before I take this medicine? They need to know if you have any of these conditions: -liver disease -low levels of magnesium in the blood -an unusual or allergic reaction to omeprazole, lansoprazole, pantoprazole, rabeprazole, other medicines, foods, dyes, or preservatives -pregnant or trying to get pregnant -breast-feeding How should I use this medicine? Take this medicine by mouth. Swallow the tablets whole with a drink of water. Follow the directions on the prescription label. Do not crush, break, or chew. Take your medicine at regular intervals. Do not take your medicine more often than directed. Talk to your pediatrician regarding the use of this medicine in children. While this drug may be prescribed for children as young as 5 years for selected conditions, precautions do apply. Overdosage: If you think you have taken too much of this medicine contact a poison control center or emergency room at once. NOTE: This medicine is only for you. Do not share this medicine with others. What if I miss a dose? If you miss a dose, take it as soon as you can. If it is almost time for your next dose, take only that dose. Do not take double or extra doses. What may interact with this medicine? Do not take this medicine with any of the following medications: -atazanavir -nelfinavir This medicine may also interact with the following  medications: -ampicillin -delavirdine -erlotinib -iron salts -medicines for fungal infections like ketoconazole, itraconazole and voriconazole -methotrexate -mycophenolate mofetil -warfarin This list may not describe all possible interactions. Give your health care provider a list of all the medicines, herbs, non-prescription drugs, or dietary supplements you use. Also tell them if you smoke, drink alcohol, or use illegal drugs. Some items may interact with your medicine. What should I watch for while using this medicine? It can take several days before your stomach pain gets better. Check with your doctor or health care professional if your condition does not start to get better, or if it gets worse. You may need blood work done while you are taking  this medicine. What side effects may I notice from receiving this medicine? Side effects that you should report to your doctor or health care professional as soon as possible: -allergic reactions like skin rash, itching or hives, swelling of the face, lips, or tongue -bone, muscle or joint pain -breathing problems -chest pain or chest tightness -dark yellow or brown urine -dizziness -fast, irregular heartbeat -feeling faint or lightheaded -fever or sore throat -muscle spasm -palpitations -redness, blistering, peeling or loosening of the skin, including inside the mouth -seizures -tremors -unusual bleeding or bruising -unusually weak or tired -yellowing of the eyes or skin Side effects that usually do not require medical attention (Report these to your doctor or health care professional if they continue or are bothersome.): -constipation -diarrhea -dry mouth -headache -nausea This list may not describe all possible side effects. Call your doctor for medical advice about side effects. You may report side effects to FDA at 1-800-FDA-1088. Where should I keep my medicine? Keep out of the reach of children. Store at room temperature  between 15 and 30 degrees C (59 and 86 degrees F). Protect from light and moisture. Throw away any unused medicine after the expiration date. NOTE: This sheet is a summary. It may not cover all possible information. If you have questions about this medicine, talk to your doctor, pharmacist, or health care provider.    2016, Elsevier/[employer]. (2014-09-18 14:45:56)

## 2016-09-14 ENCOUNTER — Other Ambulatory Visit: Payer: Self-pay | Admitting: Family Medicine

## 2016-09-14 DIAGNOSIS — R51 Headache: Principal | ICD-10-CM

## 2016-09-14 DIAGNOSIS — R519 Headache, unspecified: Secondary | ICD-10-CM

## 2016-09-18 ENCOUNTER — Ambulatory Visit
Admission: RE | Admit: 2016-09-18 | Discharge: 2016-09-18 | Disposition: A | Discharge status: Home / Self Care | Payer: No Typology Code available for payment source | Source: Ambulatory Visit | Attending: Family Medicine | Admitting: Family Medicine

## 2016-09-18 DIAGNOSIS — R519 Headache, unspecified: Secondary | ICD-10-CM

## 2016-09-18 DIAGNOSIS — R51 Headache: Principal | ICD-10-CM

## 2016-09-23 ENCOUNTER — Ambulatory Visit (INDEPENDENT_AMBULATORY_CARE_PROVIDER_SITE_OTHER): Payer: Managed Care, Other (non HMO) | Admitting: Physician Assistant

## 2016-09-23 VITALS — BP 130/90 | HR 76 | Temp 98.0°F | Resp 16 | Ht 61.0 in | Wt 193.8 lb

## 2016-09-23 DIAGNOSIS — R0981 Nasal congestion: Secondary | ICD-10-CM | POA: Diagnosis not present

## 2016-09-23 DIAGNOSIS — T7840XA Allergy, unspecified, initial encounter: Secondary | ICD-10-CM | POA: Diagnosis not present

## 2016-09-23 MED ORDER — DIPHENHYDRAMINE HCL 50 MG PO TABS: ORAL_TABLET | ORAL | 0 refills | Status: DC

## 2016-09-23 MED ORDER — DIPHENHYDRAMINE HCL 25 MG PO CAPS
50.0000 mg | ORAL_CAPSULE | Freq: Once | ORAL | Status: AC
  Administered 2016-09-23: 50 mg via ORAL

## 2016-09-23 MED ORDER — RANITIDINE HCL 150 MG PO TABS: 150.0000 mg | ORAL_TABLET | Freq: Two times a day (BID) | ORAL | 0 refills | Status: DC

## 2016-09-23 MED ORDER — FLUTICASONE PROPIONATE 50 MCG/ACT NA SUSP: 2.0000 | Freq: Every day | NASAL | 0 refills | Status: DC

## 2016-09-23 NOTE — Patient Instructions (Addendum)
Take medications as prescribed until symptoms completely resolve. Avoid any shellfish until you have your allergy appointment. If you develop any new difficulty breathing, swallowing, new chest pain, heart palpations, or dizziness, seek care immediately at the ED.   Food Allergy Introduction A food allergy is when your body reacts to a food in a way that is not normal. The reaction can be gentle or very bad. Signs of a Gentle Reaction  Stuffy nose.  Tingling in the mouth.  An itchy, red rash.  Throwing up (vomiting).  Watery poop (diarrhea). Signs of a Very Bad Reaction  Puffiness (swelling). This may be on the lips, face, or tongue, or in the mouth or throat.  Breathing loudly (wheezing).  A hoarse voice.  Itchy, red, swollen areas of skin (hives).  Dizziness or light-headedness.  Fainting.  Trouble breathing or swallowing.  A tight feeling in the chest.  A very fast heartbeat. Follow these instructions at home: General instructions  Avoid the foods that you are allergic to.  Read food labels. Look for ingredients that you are allergic to.  When you are at a restaurant, tell your server that you have an allergy. Ask if your meal has an ingredient that you are allergic to.  Take medicines only as told by your doctor. Do not drive until the medicine has worn off, unless your doctor says it is okay.  Tell all people who care for you that you have a food allergy. This includes your doctor and dentist.  If you think that you might be allergic to something else, talk with your doctor. Do not eat a food to see if you are allergic to it without talking with your doctor first. If you have a very bad allergy:  Wear a bracelet or necklace that says you have an allergy.  Carry your allergy kit (anaphylaxis kit) or an allergy shot (epinephrine injection) with you all the time. Use them as told by your doctor.  Make sure that you, your family, and your boss know:  How to  use your allergy kit.  How to give you an allergy shot.  If you use the medicine epinephrine:  Get more right away in case you have another reaction.  Get help. You can have a life-threatening reaction after taking the medicine. If you are being tested for an allergy:  Follow a diet as told by your doctor.  Keep a food diary as told by your doctor. Every day, write down:  What you eat and drink and when.  What problems you have and when. Contact a doctor if:  The signs of your reaction have not gone away within 2 days.  The signs of your reaction get worse.  You have new signs of a reaction. Get help right away if:  You use the medicine epinephrine.  You are having a very bad reaction. Signs of a very bad reaction are:  Puffiness. This may be on the lips, face, or tongue, or in the mouth or throat.  Breathing loudly.  A hoarse voice.  Itchy, red swollen areas of skin.  Dizziness or light-headedness.  Fainting.  Trouble breathing or swallowing.  A tight feeling in the chest.  A very fast heartbeat. This information is not intended to replace advice given to you by your health care provider. Make sure you discuss any questions you have with your health care provider. Document Released: 01/18/2010 Document Revised: 01/06/2016 Document Reviewed: 05/12/2014  2017 Elsevier     IF  you received an x-ray today, you will receive an invoice from [facility]. Please contact [facility] at [phone] with questions or concerns regarding your invoice.   IF you received labwork today, you will receive an invoice from [facility]. Please contact [facility] at [phone] with questions or concerns regarding your invoice.   Our billing staff will not be able to assist you with questions regarding bills from these companies.  You will be contacted with the lab results as soon as they are available. The fastest way to get your results is to activate your My  Chart account. Instructions are located on the last page of this paperwork. If you have not heard from [place] regarding the results in 2 weeks, please contact this office.

## 2016-09-23 NOTE — Progress Notes (Signed)
Shari CageVirginia M Kepreades  MRN: [id number] DOB: 04-01-1956  Subjective:  Shari CageVirginia M Kepreades is a 61 y.o. female seen in office today for a chief complaint of left sided neck swelling immediately after eating shrimp today. This occurred about one hour prior to arrival. Had associated mild SOB and mild scratchy throat.  Denies itching, difficulty swallowing, trouble breathing, chest pain, and palpitations. She took aspirin and notes the swelling went down after about 45 minutes.  Of note, the only food allergy patient is aware of is kiwi. She has had shellfish in the past and has never had an issue with it. Has history of seasonal allergies. No history of asthma.   Review of Systems  Constitutional: Negative for chills, diaphoresis and fatigue.  HENT: Negative for congestion and voice change.   Respiratory: Negative for cough, choking, wheezing and stridor.   Gastrointestinal: Negative for abdominal pain, diarrhea, nausea and vomiting.  Skin: Negative for rash.  Neurological: Negative for dizziness and light-headedness.    There are no active problems to display for this patient.   Current Outpatient Prescriptions on File Prior to Visit  Medication Sig Dispense Refill  . amLODipine (NORVASC) 5 MG tablet Take 5 mg by mouth daily before breakfast.     . aspirin EC 81 MG tablet Take 81 mg by mouth daily.    Marland Kitchen. levothyroxine (SYNTHROID, LEVOTHROID) 50 MCG tablet Take 50 mcg by mouth daily before breakfast.    . losartan (COZAAR) 100 MG tablet Take 100 mg by mouth daily before breakfast.     . Multiple Vitamins-Minerals (MULTIVITAMIN WITH MINERALS) tablet Take 1 tablet by mouth daily.    . Pancrelipase, Lip-Prot-Amyl, (CREON) 24000 UNITS CPEP Take 1-2 capsules by mouth as directed. Takes 2 capsules with meals and 1 capsule with snacks.    . pantoprazole (PROTONIX) 40 MG tablet Take 1 tablet (40 mg total) by mouth daily. 30 tablet 0   No current facility-administered medications on file prior to  visit.     Allergies  Allergen Reactions  . Tylenol [Acetaminophen] Other (See Comments)    Makes her feel jumpy.     Objective:  BP 130/90   Pulse 76   Temp 98 F (36.7 C) (Oral)   Resp 16   Ht 5\' 1"  (1.549 m)   Wt 193 lb 12.8 oz (87.9 kg)   SpO2 97%   BMI 36.62 kg/m   Physical Exam  Constitutional: She is oriented to person, place, and time and well-developed, well-nourished, and in no distress. No distress.  HENT:  Head: Normocephalic and atraumatic.  Right Ear: External ear and ear canal normal. Tympanic membrane is injected.  Left Ear: Tympanic membrane, external ear and ear canal normal.  Nose: Mucosal edema (more prominent on right side ) present. Right sinus exhibits no maxillary sinus tenderness and no frontal sinus tenderness. Left sinus exhibits no maxillary sinus tenderness and no frontal sinus tenderness.  Mouth/Throat: Uvula is midline, oropharynx is clear and moist and mucous membranes are normal.  Eyes: Conjunctivae are normal.  Neck: Normal range of motion.  No swelling on left side of the neck noted.   Cardiovascular: Normal rate, regular rhythm and normal heart sounds.   Pulmonary/Chest: Effort normal and breath sounds normal.  Lymphadenopathy:       Head (right side): No submental, no submandibular, no tonsillar, no preauricular, no posterior auricular and no occipital adenopathy present.       Head (left side): No submental, no submandibular, no tonsillar, no preauricular,  no posterior auricular and no occipital adenopathy present.    She has no cervical adenopathy.       Right: No supraclavicular adenopathy present.       Left: No supraclavicular adenopathy present.  Neurological: She is alert and oriented to person, place, and time. Gait normal.  Skin: Skin is warm and dry.  Psychiatric: Affect normal.  Vitals reviewed.   Assessment and Plan :  1. Allergic reaction, initial encounter -Pt's symptoms of concern have resolved prior to examination.  Physical exam reassuring. Benedryl administered in office. Given referral to allergist as she is concerned about new potential food allergies. Instructed to avoid shellfish in the meantime. Return to clinic or ED if symptoms worsen,, or as needed - diphenhydrAMINE (BENADRYL) capsule 50 mg; Take 2 capsules (50 mg total) by mouth once. - diphenhydrAMINE (BENADRYL) 50 MG tablet; Take 1 tablet every 4-6 hours as needed for swelling.  Dispense: 30 tablet; Refill: 0 - ranitidine (ZANTAC) 150 MG tablet; Take 1 tablet (150 mg total) by mouth 2 (two) times daily.  Dispense: 30 tablet; Refill: 0 - Ambulatory referral to Allergy  2. Nasal congestion -Pt has untreated seasonal allergies, recommend flonase due to PE findings of nasal mucosal edema.  - fluticasone (FLONASE) 50 MCG/ACT nasal spray; Place 2 sprays into both nostrils daily.  Dispense: 16 g; Refill: 0   Benjiman Core PA-C  [facility] 09/23/2016 3:40 PM

## 2017-07-02 ENCOUNTER — Other Ambulatory Visit: Payer: Self-pay | Admitting: Cardiovascular Disease

## 2017-07-02 ENCOUNTER — Ambulatory Visit
Admission: RE | Admit: 2017-07-02 | Discharge: 2017-07-02 | Disposition: A | Discharge status: Home / Self Care | Payer: Managed Care, Other (non HMO) | Source: Ambulatory Visit | Attending: Cardiovascular Disease | Admitting: Cardiovascular Disease

## 2017-07-02 DIAGNOSIS — M542 Cervicalgia: Secondary | ICD-10-CM

## 2017-07-02 DIAGNOSIS — R200 Anesthesia of skin: Secondary | ICD-10-CM

## 2017-09-07 ENCOUNTER — Observation Stay (HOSPITAL_COMMUNITY)
Admission: AD | Admit: 2017-09-07 | Discharge: 2017-09-08 | Disposition: A | Discharge status: Home / Self Care | Payer: Managed Care, Other (non HMO) | Source: Ambulatory Visit | Attending: Cardiovascular Disease | Admitting: Cardiovascular Disease

## 2017-09-07 ENCOUNTER — Encounter (HOSPITAL_COMMUNITY): Payer: Self-pay | Admitting: Cardiovascular Disease

## 2017-09-07 ENCOUNTER — Other Ambulatory Visit: Payer: Self-pay

## 2017-09-07 DIAGNOSIS — I249 Acute ischemic heart disease, unspecified: Secondary | ICD-10-CM | POA: Diagnosis present

## 2017-09-07 DIAGNOSIS — K219 Gastro-esophageal reflux disease without esophagitis: Secondary | ICD-10-CM | POA: Diagnosis not present

## 2017-09-07 DIAGNOSIS — I10 Essential (primary) hypertension: Secondary | ICD-10-CM | POA: Diagnosis not present

## 2017-09-07 DIAGNOSIS — Z886 Allergy status to analgesic agent status: Secondary | ICD-10-CM | POA: Diagnosis not present

## 2017-09-07 DIAGNOSIS — E669 Obesity, unspecified: Secondary | ICD-10-CM | POA: Insufficient documentation

## 2017-09-07 DIAGNOSIS — Z7982 Long term (current) use of aspirin: Secondary | ICD-10-CM | POA: Insufficient documentation

## 2017-09-07 DIAGNOSIS — R0602 Shortness of breath: Secondary | ICD-10-CM | POA: Diagnosis not present

## 2017-09-07 DIAGNOSIS — R072 Precordial pain: Secondary | ICD-10-CM | POA: Diagnosis present

## 2017-09-07 DIAGNOSIS — Z79899 Other long term (current) drug therapy: Secondary | ICD-10-CM | POA: Insufficient documentation

## 2017-09-07 LAB — CBC WITH DIFFERENTIAL/PLATELET
Basophils Absolute: 0.1 10*3/uL (ref 0.0–0.1)
Basophils Relative: 1 %
EOS ABS: 0.1 10*3/uL (ref 0.0–0.7)
Eosinophils Relative: 2 %
HEMATOCRIT: 36.8 % (ref 36.0–46.0)
HEMOGLOBIN: 11.7 g/dL — AB (ref 12.0–15.0)
Lymphocytes Relative: 31 %
Lymphs Abs: 2.3 10*3/uL (ref 0.7–4.0)
MCH: 25.5 pg — AB (ref 26.0–34.0)
MCHC: 31.8 g/dL (ref 30.0–36.0)
MCV: 80.3 fL (ref 78.0–100.0)
Monocytes Absolute: 0.7 10*3/uL (ref 0.1–1.0)
Monocytes Relative: 10 %
NEUTROS PCT: 56 %
Neutro Abs: 4.1 10*3/uL (ref 1.7–7.7)
Platelets: 245 10*3/uL (ref 150–400)
RBC: 4.58 MIL/uL (ref 3.87–5.11)
RDW: 14.6 % (ref 11.5–15.5)
WBC: 7.3 10*3/uL (ref 4.0–10.5)

## 2017-09-07 LAB — COMPREHENSIVE METABOLIC PANEL
ALBUMIN: 3.5 g/dL (ref 3.5–5.0)
ALK PHOS: 70 U/L (ref 38–126)
ALT: 18 U/L (ref 14–54)
ANION GAP: 9 (ref 5–15)
AST: 16 U/L (ref 15–41)
BUN: 20 mg/dL (ref 6–20)
CALCIUM: 8.9 mg/dL (ref 8.9–10.3)
CO2: 24 mmol/L (ref 22–32)
CREATININE: 0.76 mg/dL (ref 0.44–1.00)
Chloride: 109 mmol/L (ref 101–111)
GFR calc Af Amer: >60 mL/min (ref >60)
GFR calc non Af Amer: >60 mL/min (ref >60)
GLUCOSE: 101 mg/dL — AB (ref 65–99)
Potassium: 3.8 mmol/L (ref 3.5–5.1)
SODIUM: 142 mmol/L (ref 135–145)
Total Bilirubin: 0.4 mg/dL (ref 0.3–1.2)
Total Protein: 6.4 g/dL — ABNORMAL LOW (ref 6.5–8.1)

## 2017-09-07 LAB — TROPONIN I: Troponin I: <0.03 ng/mL (ref <0.03)

## 2017-09-07 LAB — HEPARIN LEVEL (UNFRACTIONATED): HEPARIN UNFRACTIONATED: 0.17 [IU]/mL — AB (ref 0.30–0.70)

## 2017-09-07 MED ORDER — HEPARIN (PORCINE) IN NACL 100-0.45 UNIT/ML-% IJ SOLN
1150.0000 [IU]/h | INTRAMUSCULAR | Status: DC
  Administered 2017-09-07: 800 [IU]/h via INTRAVENOUS
  Administered 2017-09-08: 1150 [IU]/h via INTRAVENOUS
  Filled 2017-09-07 (×2): qty 250

## 2017-09-07 MED ORDER — ASPIRIN 81 MG PO CHEW
324.0000 mg | CHEWABLE_TABLET | ORAL | Status: AC
  Administered 2017-09-07: 324 mg via ORAL
  Filled 2017-09-07: qty 4

## 2017-09-07 MED ORDER — AMLODIPINE BESYLATE 5 MG PO TABS
5.0000 mg | ORAL_TABLET | Freq: Every day | ORAL | Status: DC
  Administered 2017-09-08: 5 mg via ORAL
  Filled 2017-09-07: qty 1

## 2017-09-07 MED ORDER — LEVOTHYROXINE SODIUM 50 MCG PO TABS
50.0000 ug | ORAL_TABLET | Freq: Every day | ORAL | Status: DC
  Administered 2017-09-08: 50 ug via ORAL
  Filled 2017-09-07: qty 1

## 2017-09-07 MED ORDER — NITROGLYCERIN 0.4 MG SL SUBL: 0.4000 mg | SUBLINGUAL_TABLET | SUBLINGUAL | Status: DC | PRN

## 2017-09-07 MED ORDER — LOSARTAN POTASSIUM 50 MG PO TABS
100.0000 mg | ORAL_TABLET | Freq: Every day | ORAL | Status: DC
  Administered 2017-09-08: 100 mg via ORAL
  Filled 2017-09-07: qty 2

## 2017-09-07 MED ORDER — ONDANSETRON HCL 4 MG/2ML IJ SOLN: 4.0000 mg | Freq: Four times a day (QID) | INTRAMUSCULAR | Status: DC | PRN

## 2017-09-07 MED ORDER — ASPIRIN 300 MG RE SUPP
300.0000 mg | RECTAL | Status: AC
  Filled 2017-09-07: qty 1

## 2017-09-07 MED ORDER — PANCRELIPASE (LIP-PROT-AMYL) 12000-38000 UNITS PO CPEP
24000.0000 [IU] | ORAL_CAPSULE | Freq: Three times a day (TID) | ORAL | Status: DC
  Administered 2017-09-07 – 2017-09-08 (×2): 24000 [IU] via ORAL
  Filled 2017-09-07 (×2): qty 2

## 2017-09-07 MED ORDER — PANTOPRAZOLE SODIUM 40 MG PO TBEC: 40.0000 mg | DELAYED_RELEASE_TABLET | Freq: Every day | ORAL | Status: DC

## 2017-09-07 MED ORDER — HEPARIN BOLUS VIA INFUSION
4000.0000 [IU] | Freq: Once | INTRAVENOUS | Status: AC
Start: 2017-09-07 — End: 2017-09-07
  Administered 2017-09-07: 4000 [IU] via INTRAVENOUS
  Filled 2017-09-07: qty 4000

## 2017-09-07 MED ORDER — ASPIRIN EC 81 MG PO TBEC
81.0000 mg | DELAYED_RELEASE_TABLET | Freq: Every day | ORAL | Status: DC
  Administered 2017-09-08: 81 mg via ORAL
  Filled 2017-09-07: qty 1

## 2017-09-07 NOTE — H&P (Signed)
Referring Physician: Orpah Cobb, MD  [name] is an 62 y.o. female.                       Chief Complaint: Chest pain and shortness of breath  HPI: 62 year old female with PMH of hypertension, GERD, Pancreatic insufficiency and obesity has atypical right sided chest pain, arm numbness and jaw pain with shortness of breath x 3 days. Her EKG showed inferior wall ischemic changes somewhat chronic.   Past Medical History:  Diagnosis Date  . GERD (gastroesophageal reflux disease)   . Heart disorder    Heart Muscle Spasms  . Hypertension   . Pancreatic insufficiency       Past Surgical History:  Procedure Laterality Date  . CESAREAN SECTION    . ESOPHAGOGASTRODUODENOSCOPY (EGD) WITH PROPOFOL N/A 01/01/2013   Procedure: ESOPHAGOGASTRODUODENOSCOPY (EGD) WITH PROPOFOL;  Surgeon: Willis Modena, MD;  Location: [facility];  Service: Endoscopy;  Laterality: N/A;  . EUS N/A 01/01/2013   Procedure: ESOPHAGEAL ENDOSCOPIC ULTRASOUND (EUS) RADIAL;  Surgeon: Willis Modena, MD;  Location: [facility];  Service: Endoscopy;  Laterality: N/A;  . LEFT HEART CATH AND CORONARY ANGIOGRAPHY  2010  . NASAL SINUS SURGERY    . TUBAL LIGATION  2001    Family History  Problem Relation Age of Onset  . Diabetes Father   . Heart disease Father   . Hyperlipidemia Father    Social History:  reports that  has never smoked. she has never used smokeless tobacco. She reports that she drinks alcohol. She reports that she does not use drugs.  Allergies:  Allergies  Allergen Reactions  . Tylenol [Acetaminophen] Other (See Comments)    Makes her feel jumpy.    Medications Prior to Admission  Medication Sig Dispense Refill  . amLODipine (NORVASC) 5 MG tablet Take 5 mg by mouth daily before breakfast.     . aspirin EC 81 MG tablet Take 81 mg by mouth daily.    . diphenhydrAMINE (BENADRYL) 50 MG tablet Take 1 tablet every 4-6 hours as needed for swelling. 30 tablet 0  . fluticasone (FLONASE) 50  MCG/ACT nasal spray Place 2 sprays into both nostrils daily. 16 g 0  . levothyroxine (SYNTHROID, LEVOTHROID) 50 MCG tablet Take 50 mcg by mouth daily before breakfast.    . losartan (COZAAR) 100 MG tablet Take 100 mg by mouth daily before breakfast.     . Multiple Vitamins-Minerals (MULTIVITAMIN WITH MINERALS) tablet Take 1 tablet by mouth daily.    . Pancrelipase, Lip-Prot-Amyl, (CREON) 24000 UNITS CPEP Take 1-2 capsules by mouth as directed. Takes 2 capsules with meals and 1 capsule with snacks.    . pantoprazole (PROTONIX) 40 MG tablet Take 1 tablet (40 mg total) by mouth daily. 30 tablet 0  . ranitidine (ZANTAC) 150 MG tablet Take 1 tablet (150 mg total) by mouth 2 (two) times daily. 30 tablet 0    No results found for this or any previous visit (from the past 48 hour(s)). No results found.  Review Of Systems Constitutional: No fever, chills, weight loss. Positive chronic weight gain. Eyes: No vision change, wears glasses. No discharge or pain. Ears: No hearing loss, No tinnitus. Respiratory: No asthma, COPD, pneumonias. Positive shortness of breath. No hemoptysis. Cardiovascular: Positive chest pain, palpitation, leg edema. Gastrointestinal: Positive nausea, no vomiting, diarrhea, constipation. No GI bleed. No hepatitis. Positive GERD. No hepatitis. Genitourinary: No dysuria, hematuria, kidney stone. No incontinance. Neurological: No headache, stroke, seizures.  Psychiatry: No psych facility admission for anxiety, depression, suicide. No detox. Skin: No rash. Musculoskeletal: Positive joint pain, fibromyalgia. No neck pain, back pain. Lymphadenopathy: No lymphadenopathy. Hematology: No anemia or easy bruising.   Height 5\' 1"  (1.549 m), weight 84.4 kg (186 lb 1.6 oz). Body mass index is 35.16 kg/m. General appearance: alert, cooperative, appears stated age and no distress Head: Normocephalic, atraumatic. Eyes: Brown eyes, pink conjunctiva, corneas clear. PERRL, EOM's  intact. Neck: No adenopathy, no carotid bruit, no JVD, supple, symmetrical, trachea midline and thyroid not enlarged. Resp: Clear to auscultation bilaterally. Cardio: Regular rate and rhythm, S1, S2 normal, II/VI systolic murmur, no click, rub or gallop GI: Soft, non-tender; bowel sounds normal; no organomegaly. Extremities: No edema, cyanosis or clubbing. Skin: Warm and dry.  Neurologic: Alert and oriented X 3, normal strength. Normal coordination and gait.  Assessment/Plan Acute coronary syndrome Hypertension Obesity GERD Pancreatic insufficiency  Place in observation. R/O MI Nuclear stress test in AM.  Ricki RodriguezAjay S Jaianna Nicoll, MD  09/07/2017, 5:04 PM

## 2017-09-07 NOTE — Progress Notes (Signed)
ANTICOAGULATION CONSULT NOTE - Initial Consult  Pharmacy Consult for heparin Indication: chest pain/ACS  Allergies  Allergen Reactions  . Tylenol [Acetaminophen] Other (See Comments)    Makes her feel jumpy.    Patient Measurements: Height: 5\' 1"  (154.9 cm) Weight: 186 lb 1.6 oz (84.4 kg) IBW/kg (Calculated) : 47.8 Heparin Dosing Weight: 67.1 kg   Vital Signs:    Labs: No results for input(s): HGB, HCT, PLT, APTT, LABPROT, INR, HEPARINUNFRC, HEPRLOWMOCWT, CREATININE, CKTOTAL, CKMB, TROPONINI in the last 72 hours.  CrCl cannot be calculated (Patient's most recent lab result is older than the maximum 21 days allowed.).   Medical History: Past Medical History:  Diagnosis Date  . GERD (gastroesophageal reflux disease)   . Heart disorder    Heart Muscle Spasms  . Hypertension   . Pancreatic insufficiency      Assessment: [name] is a 62 year old female who presented from Dr. Roseanne KaufmanKadakia's office with possible STEMI/ACS. Pharmacy has been consulted to dose heparin. Patient is not on any anticoagulants at home and has no noticeable signs of bleeding.    Goal of Therapy:  Heparin level 0.3-0.7 units/ml Monitor platelets by anticoagulation protocol: Yes   Plan:  Heparin 4000 units X 1  Then heparin 800 units/hr 6 hour heparin level Daily heparin level/CBC Monitor for signs/symptoms of bleeding  Sharin MonsEmily Sinclair, PharmD, BCPS PGY2 Infectious Diseases Pharmacy Resident Pager: [phone]  09/07/2017,5:11 PM

## 2017-09-08 ENCOUNTER — Observation Stay (HOSPITAL_COMMUNITY): Payer: Managed Care, Other (non HMO)

## 2017-09-08 DIAGNOSIS — I249 Acute ischemic heart disease, unspecified: Secondary | ICD-10-CM | POA: Diagnosis not present

## 2017-09-08 LAB — BASIC METABOLIC PANEL
ANION GAP: 9 (ref 5–15)
BUN: 13 mg/dL (ref 6–20)
CO2: 25 mmol/L (ref 22–32)
Calcium: 8.8 mg/dL — ABNORMAL LOW (ref 8.9–10.3)
Chloride: 107 mmol/L (ref 101–111)
Creatinine, Ser: 0.63 mg/dL (ref 0.44–1.00)
Glucose, Bld: 99 mg/dL (ref 65–99)
POTASSIUM: 4.1 mmol/L (ref 3.5–5.1)
SODIUM: 141 mmol/L (ref 135–145)

## 2017-09-08 LAB — TROPONIN I: Troponin I: <0.03 ng/mL (ref <0.03)

## 2017-09-08 LAB — HIV ANTIBODY (ROUTINE TESTING W REFLEX): HIV Screen 4th Generation wRfx: NONREACTIVE

## 2017-09-08 LAB — CBC
HEMATOCRIT: 36.1 % (ref 36.0–46.0)
HEMOGLOBIN: 11.4 g/dL — AB (ref 12.0–15.0)
MCH: 25.4 pg — ABNORMAL LOW (ref 26.0–34.0)
MCHC: 31.6 g/dL (ref 30.0–36.0)
MCV: 80.6 fL (ref 78.0–100.0)
Platelets: 213 10*3/uL (ref 150–400)
RBC: 4.48 MIL/uL (ref 3.87–5.11)
RDW: 14.7 % (ref 11.5–15.5)
WBC: 5 10*3/uL (ref 4.0–10.5)

## 2017-09-08 LAB — LIPID PANEL
CHOL/HDL RATIO: 2.7 ratio
Cholesterol: 152 mg/dL (ref 0–200)
HDL: 57 mg/dL (ref >40)
LDL Cholesterol: 88 mg/dL (ref 0–99)
Triglycerides: 37 mg/dL (ref <150)
VLDL: 7 mg/dL (ref 0–40)

## 2017-09-08 LAB — HEPARIN LEVEL (UNFRACTIONATED)
HEPARIN UNFRACTIONATED: 0.33 [IU]/mL (ref 0.30–0.70)
Heparin Unfractionated: 0.26 IU/mL — ABNORMAL LOW (ref 0.30–0.70)

## 2017-09-08 MED ORDER — CARVEDILOL 3.125 MG PO TABS: 3.1250 mg | ORAL_TABLET | Freq: Two times a day (BID) | ORAL | Status: DC

## 2017-09-08 MED ORDER — TECHNETIUM TC 99M TETROFOSMIN IV KIT
30.0000 | PACK | Freq: Once | INTRAVENOUS | Status: AC | PRN
  Administered 2017-09-08: 30 via INTRAVENOUS

## 2017-09-08 MED ORDER — REGADENOSON 0.4 MG/5ML IV SOLN
INTRAVENOUS | Status: AC
  Filled 2017-09-08: qty 5

## 2017-09-08 MED ORDER — CARVEDILOL 3.125 MG PO TABS: 3.1250 mg | ORAL_TABLET | Freq: Two times a day (BID) | ORAL | 3 refills | Status: DC

## 2017-09-08 MED ORDER — REGADENOSON 0.4 MG/5ML IV SOLN
0.4000 mg | Freq: Once | INTRAVENOUS | Status: DC
  Filled 2017-09-08: qty 5

## 2017-09-08 MED ORDER — TECHNETIUM TC 99M TETROFOSMIN IV KIT
10.0000 | PACK | Freq: Once | INTRAVENOUS | Status: AC | PRN
  Administered 2017-09-08: 10 via INTRAVENOUS

## 2017-09-08 MED ORDER — HEPARIN BOLUS VIA INFUSION
2000.0000 [IU] | Freq: Once | INTRAVENOUS | Status: AC
  Administered 2017-09-08: 2000 [IU] via INTRAVENOUS
  Filled 2017-09-08: qty 2000

## 2017-09-08 MED ORDER — AMINOPHYLLINE 25 MG/ML IV SOLN
INTRAVENOUS | Status: AC
  Filled 2017-09-08: qty 10

## 2017-09-08 NOTE — Progress Notes (Signed)
Heparin drip increased to 11.5 per order.  [facility]  Patient updated on plan of care.

## 2017-09-08 NOTE — Progress Notes (Signed)
Patient discharged for home.  She declined wheelchair escort to car and chose to walk to car.

## 2017-09-08 NOTE — Progress Notes (Signed)
ANTICOAGULATION CONSULT NOTE  Pharmacy Consult for heparin Indication: chest pain/ACS  Allergies  Allergen Reactions  . Tylenol [Acetaminophen] Other (See Comments)    Makes her feel jumpy.    Patient Measurements: Height: 5\' 1"  (154.9 cm) Weight: 187 lb (84.8 kg) IBW/kg (Calculated) : 47.8 Heparin Dosing Weight: 67.1 kg   Vital Signs: Temp: 97.7 F (36.5 C) (01/26 1100) Temp Source: Oral (01/26 1100) BP: 112/60 (01/26 1100) Pulse Rate: 70 (01/26 1100)  Labs: Recent Labs    09/07/17 1727 09/07/17 2305 09/08/17 0524 09/08/17 1249  HGB 11.7*  --  11.4*  --   HCT 36.8  --  36.1  --   PLT 245  --  213  --   HEPARINUNFRC  --  0.17* 0.26* 0.33  CREATININE 0.76  --  0.63  --   TROPONINI <0.03 <0.03 <0.03  --     Estimated Creatinine Clearance: 73.9 mL/min (by C-G formula based on SCr of 0.63 mg/dL).   Medical History: Past Medical History:  Diagnosis Date  . GERD (gastroesophageal reflux disease)   . Heart disorder    Heart Muscle Spasms  . Hypertension   . Pancreatic insufficiency      Assessment: [name] is a 62 year old female who presented from Dr. Roseanne KaufmanKadakia's office with possible STEMI/ACS. Pharmacy has been consulted to dose heparin. Patient is not on any anticoagulants at home. -heparin level is at goal on  1150 units/hr   Goal of Therapy:  Heparin level 0.3-0.7 units/ml Monitor platelets by anticoagulation protocol: Yes   Plan:  No heparin changes needed Daily heparin level/CBC  Harland Germanndrew Jenaye Rickert, Pharm D 09/08/2017 2:01 PM

## 2017-09-08 NOTE — Progress Notes (Signed)
ANTICOAGULATION CONSULT NOTE  Pharmacy Consult for heparin Indication: chest pain/ACS  Labs: Recent Labs    09/07/17 1727 09/07/17 2305 09/08/17 0524  HGB 11.7*  --  11.4*  HCT 36.8  --  36.1  PLT 245  --  213  HEPARINUNFRC  --  0.17* 0.26*  CREATININE 0.76  --   --   TROPONINI <0.03 <0.03  --     Assessment: 61yo female subtherapeutic on heparin with initial dosing for ACS.    Goal of Therapy:  Heparin level 0.3-0.7 units/ml    Plan:  -Increase heparin to 1150 units/hr -Daily HL, CBC -Level in 6 hours  Baldemar FridayMasters, Marin Wisner M  09/08/2017 7:01 AM

## 2017-09-08 NOTE — Progress Notes (Signed)
Discharge instructions reviewed with patient and she stated understanding.  She confirmed she has personal belongings of cell phone, phone charger, clothing, shoes, and watch.  No voiced complaints.

## 2017-09-08 NOTE — Plan of Care (Signed)
  Completed/Met Education: Knowledge of General Education information will improve 09/08/2017 1405 - Completed/Met by Alonna Buckler, RN Health Behavior/Discharge Planning: Ability to manage health-related needs will improve 09/08/2017 1405 - Completed/Met by Alonna Buckler, RN Clinical Measurements: Ability to maintain clinical measurements within normal limits will improve 09/08/2017 1405 - Completed/Met by Alonna Buckler, RN Will remain free from infection 09/08/2017 1405 - Completed/Met by Alonna Buckler, RN Diagnostic test results will improve 09/08/2017 1405 - Completed/Met by Alonna Buckler, RN Respiratory complications will improve 09/08/2017 1405 - Completed/Met by Alonna Buckler, RN Cardiovascular complication will be avoided 09/08/2017 1405 - Completed/Met by Alonna Buckler, RN Activity: Risk for activity intolerance will decrease 09/08/2017 1405 - Completed/Met by Alonna Buckler, RN Nutrition: Adequate nutrition will be maintained 09/08/2017 1405 - Completed/Met by Alonna Buckler, RN Coping: Level of anxiety will decrease 09/08/2017 1405 - Completed/Met by Alonna Buckler, RN Elimination: Will not experience complications related to bowel motility 09/08/2017 1405 - Completed/Met by Alonna Buckler, RN Will not experience complications related to urinary retention 09/08/2017 1405 - Completed/Met by Alonna Buckler, RN Pain Managment: General experience of comfort will improve 09/08/2017 1405 - Completed/Met by Alonna Buckler, RN Safety: Ability to remain free from injury will improve 09/08/2017 1405 - Completed/Met by Alonna Buckler, RN Skin Integrity: Risk for impaired skin integrity will decrease 09/08/2017 1405 - Completed/Met by Alonna Buckler, RN Spiritual Needs Ability to function at adequate level 09/08/2017 1405 - Completed/Met by Alonna Buckler, RN

## 2017-09-08 NOTE — Discharge Summary (Signed)
Physician Discharge Summary  Patient ID: [name] MRN: [id number] DOB/AGE: 61-02-1956 61 y.o.  Admit date: 09/07/2017 Discharge date: 09/08/2017  Admission Diagnoses: Acute coronary syndrome Hypertension Obesity GERD Pancreatic insufficiency  Discharge Diagnoses:  Principal Problem:   Chest pain, precordial Active Problems:   Shortness of breath   Hypertension   Obesity   GERD   Chronic Pancreatic Insufficiency  Discharged Condition: fair  Hospital Course: 62 year old female with past medical history of hypertension, GERD, pancreatic insufficiency and obesity had atypical right-sided chest pain, numbness and jaw pain with shortness of breath for 3 days her EKG in the office showed inferior wall ischemic changes, somewhat chronic.  Her cardiac enzymes were normal. She underwent nuclear stress test that failed to show reversible ischemia however it showed fixed basal septal defect which could be artifact or a breast attenuation.  A small dose of carvedilol was started and patient was discharged home in status condition with follow-up by me in 1 week and by primary care physician in 1 month.  Consults: cardiology  Significant Diagnostic Studies: labs: CBC was essentially unremarkable with slightly low hemoglobin of 11.7. Basic metabolic panel was unremarkable.  Lipid panel showed total cholesterol of 152 with an LDL cholesterol of 88 and HDL cholesterol of 57 and triglyceride of 37 mg.  EKG normal sinus rhythm with early transition.  Myocardial perfusion SPECT with her wall motion showed normal LV systolic function questionable fixed defect of the septal wall at the base of the heart and no reversible perfusion defect with a left ventricular ejection fraction of 52%.  Treatments: cardiac meds: carvedilol, amlodipine and losartan.  Discharge Exam: Blood pressure 112/60, pulse 70, temperature 97.7 F (36.5 C), temperature source Oral, resp. rate 18, height 5\' 1"  (1.549  m), weight 84.8 kg (187 lb), SpO2 99 %. General appearance: alert, cooperative and appears stated age. Head: Normocephalic, atraumatic. Eyes: Brown eyes, pink conjunctiva, corneas clear. PERRL, EOM's intact.  Neck: No adenopathy, no carotid bruit, no JVD, supple, symmetrical, trachea midline and thyroid not enlarged. Resp: Clear to auscultation bilaterally. Cardio: Regular rate and rhythm, S1, S2 normal, II/VI systolic murmur, no click, rub or gallop. GI: Soft, non-tender; bowel sounds normal; no organomegaly. Extremities: No edema, cyanosis or clubbing. Skin: Warm and dry.  Neurologic: Alert and oriented X 3, normal strength and tone. Normal coordination and gait.  Disposition: 01-Home or Self Care   Allergies as of 09/08/2017      Reactions   Tylenol [acetaminophen] Other (See Comments)   Makes her feel jumpy.      Medication List    TAKE these medications   amLODipine 5 MG tablet Commonly known as:  NORVASC Take 5 mg by mouth daily before breakfast.   aspirin EC 81 MG tablet Take 81 mg by mouth daily.   carvedilol 3.125 MG tablet Commonly known as:  COREG Take 1 tablet (3.125 mg total) by mouth 2 (two) times daily with a meal.   CREON 24000-76000 units Cpep Generic drug:  Pancrelipase (Lip-Prot-Amyl) Take 1-3 capsules by mouth See admin instructions. Takes 2-3 capsules with meals and 1 capsule with snacks.   diphenhydrAMINE 50 MG tablet Commonly known as:  BENADRYL Take 1 tablet every 4-6 hours as needed for swelling.   levothyroxine 50 MCG tablet Commonly known as:  SYNTHROID, LEVOTHROID Take 50 mcg by mouth daily before breakfast.   losartan 100 MG tablet Commonly known as:  COZAAR Take 100 mg by mouth daily before breakfast.   multivitamin with minerals tablet  Take 1 tablet by mouth daily.   ranitidine 150 MG tablet Commonly known as:  ZANTAC Take 1 tablet (150 mg total) by mouth 2 (two) times daily. What changed:    when to take this  reasons to take  this      Follow-up Information    Daisy Floro, MD Follow up.   Specialty:  Family Medicine Contact information: [address] [place] [address] [phone]        Orpah Cobb, MD. Schedule an appointment as soon as possible for a visit in 1 week(s).   Specialty:  Cardiology Contact information: [address] [place] [address] [phone]           Signed: Ricki Rodriguez 09/08/2017, 2:16 PM

## 2017-09-08 NOTE — Progress Notes (Signed)
ANTICOAGULATION CONSULT NOTE - Follow Up Consult  Pharmacy Consult for heparin Indication: chest pain/ACS  Labs: Recent Labs    09/07/17 1727 09/07/17 2305  HGB 11.7*  --   HCT 36.8  --   PLT 245  --   HEPARINUNFRC  --  0.17*  CREATININE 0.76  --   TROPONINI <0.03 <0.03    Assessment: 62yo female subtherapeutic on heparin with initial dosing for ACS.   Goal of Therapy:  Heparin level 0.3-0.7 units/ml   Plan:  Will rebolus with 2000 units heparin and increase heparin gtt by 3 units/kg/hr to 1000 units/hr and check level w/ next lab draw (will be a little early).   Vernard GamblesVeronda Marialena Wollen, PharmD, BCPS  09/08/2017,12:36 AM

## 2017-09-18 ENCOUNTER — Other Ambulatory Visit: Payer: Self-pay | Admitting: Family Medicine

## 2017-09-18 DIAGNOSIS — Z1231 Encounter for screening mammogram for malignant neoplasm of breast: Secondary | ICD-10-CM

## 2017-10-15 ENCOUNTER — Other Ambulatory Visit: Payer: Self-pay | Admitting: Family Medicine

## 2017-10-15 DIAGNOSIS — M26621 Arthralgia of right temporomandibular joint: Secondary | ICD-10-CM

## 2017-10-15 DIAGNOSIS — I10 Essential (primary) hypertension: Secondary | ICD-10-CM

## 2018-09-16 ENCOUNTER — Other Ambulatory Visit: Payer: Self-pay | Admitting: Gastroenterology

## 2018-10-01 ENCOUNTER — Inpatient Hospital Stay: Admission: RE | Admit: 2018-10-01 | Payer: Managed Care, Other (non HMO) | Source: Ambulatory Visit

## 2018-10-01 ENCOUNTER — Other Ambulatory Visit: Payer: Self-pay | Admitting: Gastroenterology

## 2018-10-07 ENCOUNTER — Inpatient Hospital Stay: Admission: RE | Admit: 2018-10-07 | Payer: Managed Care, Other (non HMO) | Source: Ambulatory Visit

## 2018-10-08 ENCOUNTER — Ambulatory Visit
Admission: RE | Admit: 2018-10-08 | Discharge: 2018-10-08 | Disposition: A | Discharge status: Home / Self Care | Payer: Managed Care, Other (non HMO) | Source: Ambulatory Visit | Attending: Gastroenterology | Admitting: Gastroenterology

## 2018-10-08 MED ORDER — IOPAMIDOL (ISOVUE-300) INJECTION 61%
100.0000 mL | Freq: Once | INTRAVENOUS | Status: AC | PRN
  Administered 2018-10-08: 100 mL via INTRAVENOUS

## 2019-10-12 ENCOUNTER — Encounter (HOSPITAL_COMMUNITY): Payer: Self-pay | Admitting: Emergency Medicine

## 2019-10-12 ENCOUNTER — Emergency Department (HOSPITAL_COMMUNITY): Payer: Managed Care, Other (non HMO)

## 2019-10-12 ENCOUNTER — Emergency Department (HOSPITAL_COMMUNITY)
Admission: EM | Admit: 2019-10-12 | Discharge: 2019-10-12 | Disposition: A | Discharge status: Home / Self Care | Payer: Managed Care, Other (non HMO) | Attending: Emergency Medicine | Admitting: Emergency Medicine

## 2019-10-12 ENCOUNTER — Other Ambulatory Visit: Payer: Self-pay

## 2019-10-12 DIAGNOSIS — Z7982 Long term (current) use of aspirin: Secondary | ICD-10-CM | POA: Insufficient documentation

## 2019-10-12 DIAGNOSIS — Z79899 Other long term (current) drug therapy: Secondary | ICD-10-CM | POA: Insufficient documentation

## 2019-10-12 DIAGNOSIS — I10 Essential (primary) hypertension: Secondary | ICD-10-CM | POA: Insufficient documentation

## 2019-10-12 DIAGNOSIS — Z20822 Contact with and (suspected) exposure to covid-19: Secondary | ICD-10-CM | POA: Diagnosis not present

## 2019-10-12 DIAGNOSIS — R0602 Shortness of breath: Secondary | ICD-10-CM | POA: Diagnosis not present

## 2019-10-12 DIAGNOSIS — R079 Chest pain, unspecified: Secondary | ICD-10-CM

## 2019-10-12 LAB — BASIC METABOLIC PANEL
Anion gap: 9 (ref 5–15)
BUN: 17 mg/dL (ref 8–23)
CO2: 24 mmol/L (ref 22–32)
Calcium: 9.2 mg/dL (ref 8.9–10.3)
Chloride: 107 mmol/L (ref 98–111)
Creatinine, Ser: 0.59 mg/dL (ref 0.44–1.00)
GFR calc Af Amer: >60 mL/min (ref >60)
GFR calc non Af Amer: >60 mL/min (ref >60)
Glucose, Bld: 99 mg/dL (ref 70–99)
Potassium: 3.9 mmol/L (ref 3.5–5.1)
Sodium: 140 mmol/L (ref 135–145)

## 2019-10-12 LAB — TROPONIN I (HIGH SENSITIVITY)
Troponin I (High Sensitivity): <2 ng/L (ref <18)
Troponin I (High Sensitivity): <2 ng/L (ref <18)

## 2019-10-12 LAB — CBC
HCT: 42.3 % (ref 36.0–46.0)
Hemoglobin: 13.5 g/dL (ref 12.0–15.0)
MCH: 25.8 pg — ABNORMAL LOW (ref 26.0–34.0)
MCHC: 31.9 g/dL (ref 30.0–36.0)
MCV: 80.7 fL (ref 80.0–100.0)
Platelets: 258 10*3/uL (ref 150–400)
RBC: 5.24 MIL/uL — ABNORMAL HIGH (ref 3.87–5.11)
RDW: 14.4 % (ref 11.5–15.5)
WBC: 7.2 10*3/uL (ref 4.0–10.5)
nRBC: 0 % (ref 0.0–0.2)

## 2019-10-12 LAB — CBG MONITORING, ED: Glucose-Capillary: 78 mg/dL (ref 70–99)

## 2019-10-12 LAB — POC SARS CORONAVIRUS 2 AG -  ED: SARS Coronavirus 2 Ag: NEGATIVE

## 2019-10-12 LAB — D-DIMER, QUANTITATIVE: D-Dimer, Quant: 1 ug/mL-FEU — ABNORMAL HIGH (ref 0.00–0.50)

## 2019-10-12 MED ORDER — IOHEXOL 350 MG/ML SOLN
100.0000 mL | Freq: Once | INTRAVENOUS | Status: AC | PRN
  Administered 2019-10-12: 100 mL via INTRAVENOUS

## 2019-10-12 NOTE — ED Provider Notes (Signed)
[facility] EMERGENCY DEPARTMENT Provider Note   CSN: [id number] Arrival date & time: 10/12/19  1416     History Chief Complaint  Patient presents with  . Shortness of Breath  . Jaw Pain    Aliese Brannum Cammy Brochure is a 64 y.o. female.  HPI  HPI: A 64 year old patient with a history of hypertension presents for evaluation of chest pain. Initial onset of pain was approximately 3-6 hours ago. The patient's chest pain is not worse with exertion. The patient's chest pain is not middle- or left-sided, is not well-localized, is not described as heaviness/pressure/tightness, is not sharp and does radiate to the arms/jaw/neck. The patient does not complain of nausea and denies diaphoresis. The patient has no history of stroke, has no history of peripheral artery disease, has not smoked in the past 90 days, denies any history of treated diabetes, has no relevant family history of coronary artery disease (first degree relative at less than age 50), has no history of hypercholesterolemia and does not have an elevated BMI (>=30). Pt states she started to feel short of breath and had some pain in her right jaw that began today.  She has not had any chest pain.  The sx started around 11 am.  No history of heart disease.  No history of PE or DVT.  Pt had a heart catheterization in the past that was normal but it was yhears ago.  Past Medical History:  Diagnosis Date  . GERD (gastroesophageal reflux disease)   . Heart disorder    Heart Muscle Spasms  . Hypertension   . Pancreatic insufficiency     Patient Active Problem List   Diagnosis Date Noted  . Chest pain, precordial 09/07/2017    Class: Acute  . Shortness of breath 09/07/2017  . Acute coronary syndrome (HCC) 09/07/2017    Past Surgical History:  Procedure Laterality Date  . CESAREAN SECTION    . ESOPHAGOGASTRODUODENOSCOPY (EGD) WITH PROPOFOL N/A 01/01/2013   Procedure: ESOPHAGOGASTRODUODENOSCOPY (EGD) WITH PROPOFOL;   Surgeon: Willis Modena, MD;  Location: [facility];  Service: Endoscopy;  Laterality: N/A;  . EUS N/A 01/01/2013   Procedure: ESOPHAGEAL ENDOSCOPIC ULTRASOUND (EUS) RADIAL;  Surgeon: Willis Modena, MD;  Location: [facility];  Service: Endoscopy;  Laterality: N/A;  . LEFT HEART CATH AND CORONARY ANGIOGRAPHY  2010  . NASAL SINUS SURGERY    . TUBAL LIGATION  2001     OB History   No obstetric history on file.     Family History  Problem Relation Age of Onset  . Diabetes Father   . Heart disease Father   . Hyperlipidemia Father     Social History   Tobacco Use  . Smoking status: Never Smoker  . Smokeless tobacco: Never Used  Substance Use Topics  . Alcohol use: Yes    Comment: occassionally  . Drug use: No    Home Medications Prior to Admission medications   Medication Sig Start Date End Date Taking? Authorizing Provider  amLODipine (NORVASC) 5 MG tablet Take 5 mg by mouth daily before breakfast.     [provider]  aspirin EC 81 MG tablet Take 81 mg by mouth daily.    [provider]  carvedilol (COREG) 3.125 MG tablet Take 1 tablet (3.125 mg total) by mouth 2 (two) times daily with a meal. 09/08/17   Orpah Cobb, MD  diphenhydrAMINE (BENADRYL) 50 MG tablet Take 1 tablet every 4-6 hours as needed for swelling. 09/23/16   Benjiman Core  D, PA-C  levothyroxine (SYNTHROID, LEVOTHROID) 50 MCG tablet Take 50 mcg by mouth daily before breakfast.    [provider]  losartan (COZAAR) 100 MG tablet Take 100 mg by mouth daily before breakfast.     [provider]  Multiple Vitamins-Minerals (MULTIVITAMIN WITH MINERALS) tablet Take 1 tablet by mouth daily.    [provider]  Pancrelipase, Lip-Prot-Amyl, (CREON) 24000 UNITS CPEP Take 1-3 capsules by mouth See admin instructions. Takes 2-3 capsules with meals and 1 capsule with snacks.    [provider]  ranitidine (ZANTAC) 150 MG tablet Take 1 tablet (150 mg total) by mouth 2  (two) times daily. Patient taking differently: Take 150 mg by mouth 2 (two) times daily as needed for heartburn.  09/23/16   Benjiman Core D, PA-C    Allergies    Tylenol [acetaminophen]  Review of Systems   Review of Systems  All other systems reviewed and are negative.   Physical Exam Updated Vital Signs BP (!) 163/86   Pulse 76   Temp 98.4 F (36.9 C) (Oral)   Resp 17   Ht 1.549 m (5\' 1" )   Wt 90.7 kg   SpO2 95%   BMI 37.79 kg/m   Physical Exam Vitals and nursing note reviewed.  Constitutional:      General: She is not in acute distress.    Appearance: She is well-developed.  HENT:     Head: Normocephalic and atraumatic.     Right Ear: External ear normal.     Left Ear: External ear normal.  Eyes:     General: No scleral icterus.       Right eye: No discharge.        Left eye: No discharge.     Conjunctiva/sclera: Conjunctivae normal.  Neck:     Trachea: No tracheal deviation.     Comments: No swelling or masses  Cardiovascular:     Rate and Rhythm: Normal rate and regular rhythm.     Comments: Nl pulses Pulmonary:     Effort: Pulmonary effort is normal. No respiratory distress.     Breath sounds: Normal breath sounds. No stridor. No wheezing or rales.  Abdominal:     General: Bowel sounds are normal. There is no distension.     Palpations: Abdomen is soft.     Tenderness: There is no abdominal tenderness. There is no guarding or rebound.  Musculoskeletal:        General: No tenderness.     Cervical back: Neck supple.  Skin:    General: Skin is warm and dry.     Findings: No rash.  Neurological:     Mental Status: She is alert.     Cranial Nerves: No cranial nerve deficit (no facial droop, extraocular movements intact, no slurred speech).     Sensory: No sensory deficit.     Motor: No abnormal muscle tone or seizure activity.     Coordination: Coordination normal.     ED Results / Procedures / Treatments   Labs (all labs ordered are listed,  but only abnormal results are displayed) Labs Reviewed  D-DIMER, QUANTITATIVE (NOT AT [facility]) - Abnormal; Notable for the following components:      Result Value   D-Dimer, Quant 1.00 (*)    All other components within normal limits  CBC - Abnormal; Notable for the following components:   RBC 5.24 (*)    MCH 25.8 (*)    All other components within normal limits  BASIC METABOLIC PANEL  CBG MONITORING, ED  POC SARS CORONAVIRUS 2 AG -  ED  TROPONIN I (HIGH SENSITIVITY)    EKG EKG Interpretation  Date/Time:  Sunday October 12 2019 14:25:23 EST Ventricular Rate:  77 PR Interval:    QRS Duration: 94 QT Interval:  351 QTC Calculation: 398 R Axis:   59 Text Interpretation: Sinus rhythm Consider left ventricular hypertrophy inferior st changes decreased since last tracing Confirmed by Dorie Rank [phone]) on 10/12/2019 2:27:31 PM   Radiology DG Chest Portable 1 View  Result Date: 10/12/2019 CLINICAL DATA:  Acute chest pain and shortness of breath. EXAM: PORTABLE CHEST 1 VIEW COMPARISON:  06/23/2016 and prior chest radiographs FINDINGS: The cardiomediastinal silhouette is unremarkable. There is no evidence of focal airspace disease, pulmonary edema, suspicious pulmonary nodule/mass, pleural effusion, or pneumothorax. No acute bony abnormalities are identified. IMPRESSION: No active disease. Electronically Signed   By: Margarette Canada M.D.   On: 10/12/2019 14:56    Procedures Procedures (including critical care time)  Medications Ordered in ED Medications - No data to display  ED Course  I have reviewed the triage vital signs and the nursing notes.  Pertinent labs & imaging results that were available during my care of the patient were reviewed by me and considered in my medical decision making (see chart for details).  Clinical Course as of Oct 11 1538  Sun Oct 12, 2019  1515 Covid test negative   [JK]    Clinical Course User Index [JK] Dorie Rank, MD   MDM  Rules/Calculators/A&P HEAR Score: 3                    Pt presents with jaw pain and dyspnea.  Concerning for possible anginal equivalent, PE although overall appears well, no hypoxia, no tachypnea, tachycardia.  Low risk heart score, initial trop normal.  Plan on delta trop.  D dimer elevated, plan on CT angio assuming cr normal.  Care turned over to Dr Sherry Ruffing. Final Clinical Impression(s) / ED Diagnoses pending   Dorie Rank, MD 10/13/19 1015

## 2019-10-12 NOTE — ED Notes (Signed)
Patient verbalizes understanding of discharge instructions. Opportunity for questioning and answers were provided. Armband removed by staff, pt discharged from ED ambulatory.   

## 2019-10-12 NOTE — Discharge Instructions (Signed)
Your work-up today was overall reassuring with no evidence of blood clot on your CT scan and your heart enzymes were negative both times we checked them.  The CT scan did show some atherosclerosis as we discussed, please follow-up with your primary doctor and a cardiologist for further management of this.  Please rest and stay hydrated.  If any symptoms change or worsen, please return to the nearest emergency department.

## 2019-10-12 NOTE — ED Triage Notes (Signed)
Pt reports R jaw pain and sob that began today. She endorses some "acid like pain" in her chest that has been off and on for a while. She denies fever, cough or chills. No recent sick contacts, no leg swelling. Pt speaking in full sentences. A/ox4.

## 2019-10-12 NOTE — ED Provider Notes (Signed)
Care assumed from Dr. Lynelle Doctor.  At time of transfer care, patient is awaiting results of metabolic panel to see if she can get a CT PE study given the shortness of breath and elevated D-dimer.  BMP just returned and creatinine was normal.  She will get a CT PE study.  If work-up is reassuring, anticipate discharge home.  CT PE study did not show thromboembolism.  Delta troponin was negative.  After reassessing the patient, she is feeling better and wants to go home given the reassuring work-up.  This was the previous team's plan.  Patient discharged to follow-up with PCP and was given instructions to follow-up with cardiology.  She agreed with plan of care and was discharged in good condition.   Clinical Impression: 1. Shortness of breath   2. Chest pain, unspecified type     Disposition: Discharge  Condition: Good  I have discussed the results, Dx and Tx plan with the pt(& family if present). He/she/they expressed understanding and agree(s) with the plan. Discharge instructions discussed at great length. Strict return precautions discussed and pt &/or family have verbalized understanding of the instructions. No further questions at time of discharge.    Discharge Medication List as of 10/12/2019  7:19 PM      Follow Up: Daisy Floro, MD [address] [place] [address] [phone]     [facility] CARDIOVASCULAR DIVISION [address] [place] [address] [phone]    [facility] EMERGENCY DEPARTMENT [address] [id number] mc [place] [address] [phone]   \    Lavanna Rog, Canary Brim, MD 10/12/19 2010

## 2019-10-12 NOTE — ED Notes (Signed)
No pain  Sat low while I was standing at bedside  Nasal 92 at 2 liters placed  t 98.4

## 2019-12-09 ENCOUNTER — Encounter (HOSPITAL_COMMUNITY): Payer: Self-pay

## 2019-12-09 ENCOUNTER — Emergency Department (HOSPITAL_COMMUNITY): Payer: Managed Care, Other (non HMO)

## 2019-12-09 ENCOUNTER — Other Ambulatory Visit: Payer: Self-pay

## 2019-12-09 ENCOUNTER — Emergency Department (HOSPITAL_COMMUNITY)
Admission: EM | Admit: 2019-12-09 | Discharge: 2019-12-09 | Disposition: A | Discharge status: Home / Self Care | Payer: Managed Care, Other (non HMO) | Attending: Emergency Medicine | Admitting: Emergency Medicine

## 2019-12-09 DIAGNOSIS — R0789 Other chest pain: Secondary | ICD-10-CM | POA: Diagnosis not present

## 2019-12-09 DIAGNOSIS — Z7982 Long term (current) use of aspirin: Secondary | ICD-10-CM | POA: Diagnosis not present

## 2019-12-09 DIAGNOSIS — Z79899 Other long term (current) drug therapy: Secondary | ICD-10-CM | POA: Insufficient documentation

## 2019-12-09 DIAGNOSIS — I10 Essential (primary) hypertension: Secondary | ICD-10-CM | POA: Diagnosis not present

## 2019-12-09 DIAGNOSIS — R55 Syncope and collapse: Secondary | ICD-10-CM | POA: Insufficient documentation

## 2019-12-09 DIAGNOSIS — R0602 Shortness of breath: Secondary | ICD-10-CM | POA: Insufficient documentation

## 2019-12-09 LAB — BASIC METABOLIC PANEL
Anion gap: 12 (ref 5–15)
BUN: 15 mg/dL (ref 8–23)
CO2: 23 mmol/L (ref 22–32)
Calcium: 9.3 mg/dL (ref 8.9–10.3)
Chloride: 107 mmol/L (ref 98–111)
Creatinine, Ser: 0.67 mg/dL (ref 0.44–1.00)
GFR calc Af Amer: >60 mL/min (ref >60)
GFR calc non Af Amer: >60 mL/min (ref >60)
Glucose, Bld: 113 mg/dL — ABNORMAL HIGH (ref 70–99)
Potassium: 3.9 mmol/L (ref 3.5–5.1)
Sodium: 142 mmol/L (ref 135–145)

## 2019-12-09 LAB — URINALYSIS, ROUTINE W REFLEX MICROSCOPIC
Bilirubin Urine: NEGATIVE
Glucose, UA: NEGATIVE mg/dL
Hgb urine dipstick: NEGATIVE
Ketones, ur: NEGATIVE mg/dL
Leukocytes,Ua: NEGATIVE
Nitrite: NEGATIVE
Protein, ur: NEGATIVE mg/dL
Specific Gravity, Urine: 1.016 (ref 1.005–1.030)
pH: 5 (ref 5.0–8.0)

## 2019-12-09 LAB — CBC
HCT: 41.5 % (ref 36.0–46.0)
Hemoglobin: 13.2 g/dL (ref 12.0–15.0)
MCH: 25.8 pg — ABNORMAL LOW (ref 26.0–34.0)
MCHC: 31.8 g/dL (ref 30.0–36.0)
MCV: 81.2 fL (ref 80.0–100.0)
Platelets: 249 10*3/uL (ref 150–400)
RBC: 5.11 MIL/uL (ref 3.87–5.11)
RDW: 14.6 % (ref 11.5–15.5)
WBC: 6.5 10*3/uL (ref 4.0–10.5)
nRBC: 0 % (ref 0.0–0.2)

## 2019-12-09 LAB — TROPONIN I (HIGH SENSITIVITY): Troponin I (High Sensitivity): 3 ng/L (ref <18)

## 2019-12-09 LAB — BRAIN NATRIURETIC PEPTIDE: B Natriuretic Peptide: 26 pg/mL (ref 0.0–100.0)

## 2019-12-09 MED ORDER — SODIUM CHLORIDE 0.9% FLUSH: 3.0000 mL | Freq: Once | INTRAVENOUS | Status: DC

## 2019-12-09 NOTE — ED Notes (Signed)
Not in triage room x 2

## 2019-12-09 NOTE — ED Triage Notes (Signed)
Patient complains of near syncope while at work today and states that she isnt getting enough oxygen around her heart and lungs. Patient speaking complete sentences and in no distress. Denies pain

## 2019-12-09 NOTE — Discharge Instructions (Signed)
Your EKGs, cardiac telemetry, laboratory work, chest x-ray and exam were very reassuring today.  Please follow-up with your cardiologist for reassessment and continued evaluation and care.  Please return to ED if you have any new or concerning symptoms.

## 2019-12-09 NOTE — ED Provider Notes (Signed)
[facility] EMERGENCY DEPARTMENT Provider Note   CSN: [id number] Arrival date & time: 12/09/19  8921     History Chief Complaint  Patient presents with  . Near Syncope    [name] is a 64 y.o. female.  HPI  Patient is a 64 year old female with past medical history significant for hypertension on medication, pancreatic insufficiency on medication, GERD  Patient presents today with complaints of shortness of breath for approximately 5 months which has been intermittent.  She states that over the last week however she felt like she has been more short of breath.  She describes a sense's feeling that she does not get enough oxygen however she states that she has had no wheezing, only mild cough, she denies any weight changes, orthopnea or PND.  She denies any leg swelling, unilateral or bilateral, she denies any history of PE, history of recent surgeries, long travel, she is not on any OCPs or estrogen containing medications, she has no hemoptysis.  She denies any heart palpitations.  She does endorse some mild intermittent "twinge" in her chest that she feels when she takes a very deep breath.  She states that she has felt somewhat more anxious over the last few months but denies any particular reason for this.   She states that she had a left heart cath done in 2010 which she states was normal.  On my review of EMR was unable to see the cath report.  She is followed by cardiologist Dr. Doylene Canard.  She states she has good follow-up with him and will be able to see him within the week.     Past Medical History:  Diagnosis Date  . GERD (gastroesophageal reflux disease)   . Heart disorder    Heart Muscle Spasms  . Hypertension   . Pancreatic insufficiency     Patient Active Problem List   Diagnosis Date Noted  . Chest pain, precordial 09/07/2017    Class: Acute  . Shortness of breath 09/07/2017  . Acute coronary syndrome ([place]) 09/07/2017    Past  Surgical History:  Procedure Laterality Date  . CESAREAN SECTION    . ESOPHAGOGASTRODUODENOSCOPY (EGD) WITH PROPOFOL N/A 01/01/2013   Procedure: ESOPHAGOGASTRODUODENOSCOPY (EGD) WITH PROPOFOL;  Surgeon: Arta Silence, MD;  Location: [facility];  Service: Endoscopy;  Laterality: N/A;  . EUS N/A 01/01/2013   Procedure: ESOPHAGEAL ENDOSCOPIC ULTRASOUND (EUS) RADIAL;  Surgeon: Arta Silence, MD;  Location: [facility];  Service: Endoscopy;  Laterality: N/A;  . LEFT HEART CATH AND CORONARY ANGIOGRAPHY  2010  . NASAL SINUS SURGERY    . TUBAL LIGATION  2001     OB History   No obstetric history on file.     Family History  Problem Relation Age of Onset  . Diabetes Father   . Heart disease Father   . Hyperlipidemia Father     Social History   Tobacco Use  . Smoking status: Never Smoker  . Smokeless tobacco: Never Used  Substance Use Topics  . Alcohol use: Yes    Comment: occassionally  . Drug use: No    Home Medications Prior to Admission medications   Medication Sig Start Date End Date Taking? Authorizing Provider  amLODipine (NORVASC) 5 MG tablet Take 5 mg by mouth daily before breakfast.    Yes [provider]  aspirin EC 81 MG tablet Take 81 mg by mouth daily.   Yes [provider]  carvedilol (COREG) 3.125 MG tablet Take 1 tablet (3.125  mg total) by mouth 2 (two) times daily with a meal. 09/08/17  Yes Orpah Cobb, MD  levothyroxine (SYNTHROID, LEVOTHROID) 50 MCG tablet Take 50 mcg by mouth daily before breakfast.   Yes [provider]  losartan (COZAAR) 100 MG tablet Take 100 mg by mouth daily before breakfast.    Yes [provider]  Multiple Vitamins-Minerals (MULTIVITAMIN WITH MINERALS) tablet Take 1 tablet by mouth daily.   Yes [provider]  omeprazole (PRILOSEC) 10 MG capsule Take 10 mg by mouth daily.   Yes [provider]  Pancrelipase, Lip-Prot-Amyl, (CREON) 24000 UNITS CPEP Take 1-3 capsules by mouth See  admin instructions. Takes 2-3 capsules with meals and 1 capsule with snacks.   Yes [provider]  diphenhydrAMINE (BENADRYL) 50 MG tablet Take 1 tablet every 4-6 hours as needed for swelling. Patient not taking: Reported on 12/09/2019 09/23/16   Benjiman Core D, PA-C  ranitidine (ZANTAC) 150 MG tablet Take 1 tablet (150 mg total) by mouth 2 (two) times daily. Patient not taking: Reported on 12/09/2019 09/23/16   Benjiman Core D, PA-C    Allergies    Tylenol [acetaminophen]  Review of Systems   Review of Systems  Constitutional: Negative for chills and fever.  HENT: Negative for congestion.   Eyes: Negative for pain.  Respiratory: Positive for cough and shortness of breath.   Cardiovascular: Positive for chest pain. Negative for leg swelling.  Gastrointestinal: Negative for abdominal pain and vomiting.  Genitourinary: Negative for dysuria.  Musculoskeletal: Negative for myalgias.  Skin: Negative for rash.  Neurological: Negative for dizziness and headaches.    Physical Exam Updated Vital Signs BP 122/90   Pulse 81   Temp 98.6 F (37 C) (Oral)   Resp 18   SpO2 100%   Physical Exam Vitals and nursing note reviewed.  Constitutional:      General: She is not in acute distress.    Comments: Pleasant well-appearing 64 year old female no acute distress.  Sitting comfortably in bed.  Speaking in full sentences without difficulty.  HENT:     Head: Normocephalic and atraumatic.     Nose: Nose normal.  Eyes:     General: No scleral icterus. Cardiovascular:     Rate and Rhythm: Normal rate and regular rhythm.     Pulses: Normal pulses.     Heart sounds: Normal heart sounds.  Pulmonary:     Effort: Pulmonary effort is normal. No respiratory distress.     Breath sounds: No wheezing.     Comments: Lungs are clear to auscultation bilaterally. Abdominal:     Palpations: Abdomen is soft.     Tenderness: There is no abdominal tenderness. There is no guarding or  rebound.  Musculoskeletal:     Cervical back: Normal range of motion.     Right lower leg: No edema.     Left lower leg: No edema.  Skin:    General: Skin is warm and dry.     Capillary Refill: Capillary refill takes less than 2 seconds.  Neurological:     Mental Status: She is alert. Mental status is at baseline.     Comments: Alert and oriented to self, place, time and event.   Speech is fluent, clear without dysarthria or dysphasia.   Strength 5/5 in upper/lower extremities  Sensation intact in upper/lower extremities   Normal gait.  Negative Romberg. No pronator drift.  Normal finger-to-nose and feet tapping.  CN I not tested  CN II grossly intact visual fields  bilaterally. Did not visualize posterior eye.   CN III, IV, VI PERRLA and EOMs intact bilaterally  CN V Intact sensation to sharp and light touch to the face  CN VII facial movements symmetric  CN VIII not tested  CN IX, X no uvula deviation, symmetric rise of soft palate  CN XI 5/5 SCM and trapezius strength bilaterally  CN XII Midline tongue protrusion, symmetric L/R movements   Psychiatric:        Mood and Affect: Mood normal.        Behavior: Behavior normal.     ED Results / Procedures / Treatments   Labs (all labs ordered are listed, but only abnormal results are displayed) Labs Reviewed  BASIC METABOLIC PANEL - Abnormal; Notable for the following components:      Result Value   Glucose, Bld 113 (*)    All other components within normal limits  CBC - Abnormal; Notable for the following components:   MCH 25.8 (*)    All other components within normal limits  URINALYSIS, ROUTINE W REFLEX MICROSCOPIC  BRAIN NATRIURETIC PEPTIDE  CBG MONITORING, ED  TROPONIN I (HIGH SENSITIVITY)    EKG EKG Interpretation  Date/Time:  Tuesday December 09 2019 09:17:10 EDT Ventricular Rate:  76 PR Interval:  156 QRS Duration: 80 QT Interval:  356 QTC Calculation: 400 R Axis:   69 Text Interpretation: Normal sinus  rhythm Normal ECG No significant change was found Confirmed by Kennis Carina [phone]) on 12/09/2019 9:22:09 AM   Radiology DG Chest 2 View  Result Date: 12/09/2019 CLINICAL DATA:  Shortness of breath EXAM: CHEST - 2 VIEW COMPARISON:  Chest radiograph and chest CT October 12, 2019 FINDINGS: Lungs are clear. Heart size and pulmonary vascularity are normal. No adenopathy. No bone lesions. IMPRESSION: Lungs clear.  Cardiac silhouette within normal limits. Electronically Signed   By: Bretta Bang III M.D.   On: 12/09/2019 14:11    Procedures Procedures (including critical care time)  Medications Ordered in ED Medications  sodium chloride flush (NS) 0.9 % injection 3 mL (0 mLs Intravenous Hold 12/09/19 1250)    ED Course  I have reviewed the triage vital signs and the nursing notes.  Pertinent labs & imaging results that were available during my care of the patient were reviewed by me and considered in my medical decision making (see chart for details).    MDM Rules/Calculators/A&P                      Patient 64 year old female with history detailed above.  Presented today with shortness of breath.  She was seen 2 months ago for similar symptoms and had CTPA done which was without evidence of PE.  She has no PE risk factors and is PERC negative.  She is not tachycardic or hypoxic.  Her chest pain is very atypical for cardiac chest pain.  She has no cardiac history although she has had a heart cath done in the past but does not require any intervention such as stenting or CABG.  She is followed by Dr. Algie Coffer of cardiology.  Physical exam is unremarkable today.  She is well-appearing has clear lung sounds.  She does not appear short of breath/dyspneic and that she is speaking in full sentences without difficulty.  She also is ambulatory and denies any shortness of breath with walking.  She also denies any pain in her chest with exertion.  Doubt ACS, PE, pneumothorax, CHF.  Will obtain BNP,  troponin X1 and EKG, basic labs, urinalysis was obtained in triage.  BMP and troponin within normal limits.  EKG is without any evidence of ischemia.  BNP is without any electrolyte disturbance.  CBC with no anemia.  UA without any evidence of infection.  Patient has no headache to indicate intracranial pathology or subarachnoid, subdural or other intracranial hemorrhage.  As discussed above doubt PE or ACS.  Doubt esophageal perforation or other emergent medical condition.  She decision-making a physician with patient she is agreeable to discharge at this time will follow up with her cardiologist.  The medical records were personally reviewed by myself. I personally reviewed all lab results and interpreted all imaging studies and either concurred with their official read or contacted radiology for clarification. Additional history obtained from old records  This patient appears reasonably screened and I doubt any other medical condition requiring further workup, evaluation, or treatment in the ED at this time prior to discharge.   Patient's vitals are WNL apart from vital sign abnormalities discussed above, patient is in NAD, and able to ambulate in the ED at their baseline and able to tolerate PO.  Pain has been managed or a plan has been made for home management and has no complaints prior to discharge. Patient is comfortable with above plan and for discharge at this time. All questions were answered prior to disposition. Results from the ER workup discussed with the patient face to face and all questions answered to the best of my ability. The patient is safe for discharge with strict return precautions. Patient appears safe for discharge with appropriate follow-up. Conveyed my impression with the patient and they voiced understanding and are agreeable to plan.   An After Visit Summary was printed and given to the patient.  Portions of this note were generated with Scientist, clinical (histocompatibility and immunogenetics).  Dictation errors may occur despite best attempts at proofreading.   I discussed this case with my attending physician who cosigned this note including patient's presenting symptoms, physical exam, and planned diagnostics and interventions. Attending physician stated agreement with plan or made changes to plan which were implemented.    Final Clinical Impression(s) / ED Diagnoses Final diagnoses:  Near syncope    Rx / DC Orders ED Discharge Orders    None       Gailen Shelter, [place] 12/09/19 1425    Gwyneth Sprout, MD 12/11/19 2156

## 2020-03-31 ENCOUNTER — Encounter: Payer: Self-pay | Admitting: Pulmonary Disease

## 2020-03-31 ENCOUNTER — Ambulatory Visit: Payer: Managed Care, Other (non HMO) | Admitting: Pulmonary Disease

## 2020-03-31 ENCOUNTER — Other Ambulatory Visit: Payer: Self-pay

## 2020-03-31 VITALS — BP 120/76 | HR 76 | Temp 98.0°F | Ht 61.0 in | Wt 208.8 lb

## 2020-03-31 DIAGNOSIS — G4733 Obstructive sleep apnea (adult) (pediatric): Secondary | ICD-10-CM | POA: Diagnosis not present

## 2020-03-31 NOTE — Patient Instructions (Signed)
Moderate probability of significant obstructive sleep apnea  schedule patient for an in lab study  Follow up in 3 months

## 2020-03-31 NOTE — Progress Notes (Signed)
[place] [facility]    [id number]    11/02/1955  Primary Care Physician:Ross, Darlen Round, MD  Referring Physician: Daisy Floro, MD [address] [place],  [place] [address]  Chief complaint:   Patient with a history of snoring  HPI:  History of snoring, nonrestorative sleep Feels she is not getting enough sleep Usually goes to bed between 8 and 9 PM Sometimes takes up to an hour to fall asleep She does have a few awakenings Final wake up time about 2:30 AM About 30 pound weight gain  Admits to dryness of the mouth in the morning Admits to morning headaches  Background history of hypertension.  No family history of obstructive sleep apnea known to her    Outpatient Encounter Medications as of 03/31/2020  Medication Sig  . amLODipine (NORVASC) 5 MG tablet Take 5 mg by mouth daily before breakfast.   . aspirin EC 81 MG tablet Take 81 mg by mouth daily.  . carvedilol (COREG) 3.125 MG tablet Take 1 tablet (3.125 mg total) by mouth 2 (two) times daily with a meal.  . diphenhydrAMINE (BENADRYL) 50 MG tablet Take 1 tablet every 4-6 hours as needed for swelling.  . levothyroxine (SYNTHROID, LEVOTHROID) 50 MCG tablet Take 50 mcg by mouth daily before breakfast.  . losartan (COZAAR) 100 MG tablet Take 100 mg by mouth daily before breakfast.   . Multiple Vitamins-Minerals (MULTIVITAMIN WITH MINERALS) tablet Take 1 tablet by mouth daily.  Marland Kitchen omeprazole (PRILOSEC) 10 MG capsule Take 10 mg by mouth daily.  . Pancrelipase, Lip-Prot-Amyl, (CREON) 24000 UNITS CPEP Take 1-3 capsules by mouth See admin instructions. Takes 2-3 capsules with meals and 1 capsule with snacks.  . ranitidine (ZANTAC) 150 MG tablet Take 1 tablet (150 mg total) by mouth 2 (two) times daily.   No facility-administered encounter medications on file as of 03/31/2020.    Allergies as of 03/31/2020 - Review Complete 12/09/2019  Allergen Reaction Noted  . Tylenol [acetaminophen] Other (See  Comments) 03/16/2012    Past Medical History:  Diagnosis Date  . GERD (gastroesophageal reflux disease)   . Heart disorder    Heart Muscle Spasms  . Hypertension   . Pancreatic insufficiency     Past Surgical History:  Procedure Laterality Date  . CESAREAN SECTION    . ESOPHAGOGASTRODUODENOSCOPY (EGD) WITH PROPOFOL N/A 01/01/2013   Procedure: ESOPHAGOGASTRODUODENOSCOPY (EGD) WITH PROPOFOL;  Surgeon: Willis Modena, MD;  Location: [facility];  Service: Endoscopy;  Laterality: N/A;  . EUS N/A 01/01/2013   Procedure: ESOPHAGEAL ENDOSCOPIC ULTRASOUND (EUS) RADIAL;  Surgeon: Willis Modena, MD;  Location: [facility];  Service: Endoscopy;  Laterality: N/A;  . LEFT HEART CATH AND CORONARY ANGIOGRAPHY  2010  . NASAL SINUS SURGERY    . TUBAL LIGATION  2001    Family History  Problem Relation Age of Onset  . Diabetes Father   . Heart disease Father   . Hyperlipidemia Father     Social History   Socioeconomic History  . Marital status: Single    Spouse name: Not on file  . Number of children: Not on file  . Years of education: Not on file  . Highest education level: Not on file  Occupational History  . Not on file  Tobacco Use  . Smoking status: Never Smoker  . Smokeless tobacco: Never Used  Substance and Sexual Activity  . Alcohol use: Yes    Comment: occassionally  . Drug use: No  .  Sexual activity: Not on file  Other Topics Concern  . Not on file  Social History Narrative  . Not on file   Social Determinants of Health   Financial Resource Strain:   . Difficulty of Paying Living Expenses:   Food Insecurity:   . Worried About Programme researcher, broadcasting/film/video in the Last Year:   . Barista in the Last Year:   Transportation Needs:   . Freight forwarder (Medical):   Marland Kitchen Lack of Transportation (Non-Medical):   Physical Activity:   . Days of Exercise per Week:   . Minutes of Exercise per Session:   Stress:   . Feeling of Stress :   Social Connections:   .  Frequency of Communication with Friends and Family:   . Frequency of Social Gatherings with Friends and Family:   . Attends Religious Services:   . Active Member of Clubs or Organizations:   . Attends Banker Meetings:   Marland Kitchen Marital Status:   Intimate Partner Violence:   . Fear of Current or Ex-Partner:   . Emotionally Abused:   Marland Kitchen Physically Abused:   . Sexually Abused:     Review of Systems  Constitutional: Positive for fatigue.  Cardiovascular: Negative.   Gastrointestinal: Negative.   Psychiatric/Behavioral: Positive for sleep disturbance.    Vitals:   03/31/20 1443  BP: 120/76  Pulse: 76  Temp: 98 F (36.7 C)  SpO2: 96%     Physical Exam Constitutional:      Appearance: She is obese.  HENT:     Head: Normocephalic.     Nose: Nose normal.     Mouth/Throat:     Mouth: Mucous membranes are moist.     Comments: Mallampati 2, crowded oropharynx Eyes:     Pupils: Pupils are equal, round, and reactive to light.  Cardiovascular:     Rate and Rhythm: Normal rate.     Pulses: Normal pulses.     Heart sounds: Normal heart sounds. No murmur heard.  No friction rub.  Pulmonary:     Effort: Pulmonary effort is normal. No respiratory distress.     Breath sounds: Normal breath sounds. No stridor. No wheezing or rhonchi.  Musculoskeletal:     Cervical back: No rigidity or tenderness.  Skin:    General: Skin is warm.  Neurological:     General: No focal deficit present.     Mental Status: She is alert.   CT chest from February 2021 shows no infiltrative process  Results of the Epworth flowsheet 03/31/2020  Sitting and reading 0  Watching TV 2  Sitting, inactive in a public place (e.g. a theatre or a meeting) 1  As a passenger in a car for an hour without a break 0  Lying down to rest in the afternoon when circumstances permit 2  Sitting and talking to someone 0  Sitting quietly after a lunch without alcohol 0  In a car, while stopped for a few minutes in  traffic 0  Total score 5   Assessment:  Moderate quality of significant obstructive sleep apnea  Nonrestorative sleep  Obesity  Pathophysiology of sleep disordered breathing discussed with the patient Treatment options for sleep disordered breathing discussed with the patient  Patient feels she will not be able to perform an adequate home sleep study  Plan/Recommendations: We will schedule the patient for a split-night study  Risks of not treating sleep disordered breathing discussed  Importance of weight management and weight  loss discussed  Follow-up in 3 months  Encouraged to call with any significant concerns   Virl Diamond MD [facility] 03/31/2020, 8:01 PM  CC: Daisy Floro, MD

## 2020-04-01 ENCOUNTER — Other Ambulatory Visit: Payer: Self-pay | Admitting: Pulmonary Disease

## 2020-04-01 DIAGNOSIS — G4733 Obstructive sleep apnea (adult) (pediatric): Secondary | ICD-10-CM

## 2020-04-08 ENCOUNTER — Encounter (HOSPITAL_BASED_OUTPATIENT_CLINIC_OR_DEPARTMENT_OTHER): Payer: Managed Care, Other (non HMO) | Admitting: Pulmonary Disease

## 2020-04-09 ENCOUNTER — Telehealth: Payer: Self-pay | Admitting: Pulmonary Disease

## 2020-04-12 NOTE — Telephone Encounter (Signed)
HST was ordered waiting on auth then we will set up I have called pt to let her know

## 2020-05-08 ENCOUNTER — Emergency Department (HOSPITAL_COMMUNITY): Payer: Managed Care, Other (non HMO)

## 2020-05-08 ENCOUNTER — Emergency Department (HOSPITAL_COMMUNITY)
Admission: EM | Admit: 2020-05-08 | Discharge: 2020-05-08 | Disposition: A | Discharge status: Home / Self Care | Payer: Managed Care, Other (non HMO) | Attending: Emergency Medicine | Admitting: Emergency Medicine

## 2020-05-08 ENCOUNTER — Encounter (HOSPITAL_COMMUNITY): Payer: Self-pay | Admitting: Emergency Medicine

## 2020-05-08 ENCOUNTER — Other Ambulatory Visit: Payer: Self-pay

## 2020-05-08 DIAGNOSIS — I10 Essential (primary) hypertension: Secondary | ICD-10-CM | POA: Diagnosis not present

## 2020-05-08 DIAGNOSIS — R05 Cough: Secondary | ICD-10-CM | POA: Insufficient documentation

## 2020-05-08 DIAGNOSIS — J069 Acute upper respiratory infection, unspecified: Secondary | ICD-10-CM | POA: Diagnosis not present

## 2020-05-08 DIAGNOSIS — M791 Myalgia, unspecified site: Secondary | ICD-10-CM | POA: Insufficient documentation

## 2020-05-08 DIAGNOSIS — Z7982 Long term (current) use of aspirin: Secondary | ICD-10-CM | POA: Diagnosis not present

## 2020-05-08 DIAGNOSIS — R079 Chest pain, unspecified: Secondary | ICD-10-CM | POA: Diagnosis present

## 2020-05-08 LAB — TROPONIN I (HIGH SENSITIVITY)
Troponin I (High Sensitivity): 2 ng/L (ref <18)
Troponin I (High Sensitivity): <2 ng/L (ref <18)

## 2020-05-08 LAB — CBC
HCT: 39.4 % (ref 36.0–46.0)
Hemoglobin: 12.7 g/dL (ref 12.0–15.0)
MCH: 26.1 pg (ref 26.0–34.0)
MCHC: 32.2 g/dL (ref 30.0–36.0)
MCV: 81.1 fL (ref 80.0–100.0)
Platelets: 227 10*3/uL (ref 150–400)
RBC: 4.86 MIL/uL (ref 3.87–5.11)
RDW: 14.6 % (ref 11.5–15.5)
WBC: 9.7 10*3/uL (ref 4.0–10.5)
nRBC: 0 % (ref 0.0–0.2)

## 2020-05-08 LAB — BASIC METABOLIC PANEL
Anion gap: 10 (ref 5–15)
BUN: 13 mg/dL (ref 8–23)
CO2: 27 mmol/L (ref 22–32)
Calcium: 9.1 mg/dL (ref 8.9–10.3)
Chloride: 103 mmol/L (ref 98–111)
Creatinine, Ser: 0.62 mg/dL (ref 0.44–1.00)
GFR calc Af Amer: >60 mL/min (ref >60)
GFR calc non Af Amer: >60 mL/min (ref >60)
Glucose, Bld: 113 mg/dL — ABNORMAL HIGH (ref 70–99)
Potassium: 3.6 mmol/L (ref 3.5–5.1)
Sodium: 140 mmol/L (ref 135–145)

## 2020-05-08 MED ORDER — KETOROLAC TROMETHAMINE 30 MG/ML IJ SOLN
15.0000 mg | Freq: Once | INTRAMUSCULAR | Status: AC
  Administered 2020-05-08: 15 mg via INTRAVENOUS

## 2020-05-08 MED ORDER — BENZONATATE 100 MG PO CAPS: 100.0000 mg | ORAL_CAPSULE | Freq: Three times a day (TID) | ORAL | 0 refills | Status: DC

## 2020-05-08 MED ORDER — KETOROLAC TROMETHAMINE 30 MG/ML IJ SOLN
15.0000 mg | Freq: Once | INTRAMUSCULAR | Status: DC
  Filled 2020-05-08: qty 1

## 2020-05-08 MED ORDER — BENZONATATE 100 MG PO CAPS
200.0000 mg | ORAL_CAPSULE | Freq: Once | ORAL | Status: AC
  Administered 2020-05-08: 200 mg via ORAL
  Filled 2020-05-08: qty 2

## 2020-05-08 NOTE — ED Provider Notes (Signed)
[facility]-EMERGENCY DEPT Provider Note   CSN: [id number] Arrival date & time: 05/08/20  6378     History Chief Complaint  Patient presents with  . Chest Pain    [name] is a 64 y.o. female with a past medical history of GERD, HTN, presenting to the ED with a chief complaint of chest pain. Received her second COVID vaccine on 05/05/2020. She states she woke up the next day with L arm and leg pain, as well as cough productive with mucus. Reports pain worse with movements. She has been taking aspirin with some improvement in her symptoms. She denies any sick contacts with similar symptoms. Denies any chest pain, shortness of breath, fever, other bodyaches, abdominal pain, vomiting, diarrhea.   HPI     Past Medical History:  Diagnosis Date  . GERD (gastroesophageal reflux disease)   . Heart disorder    Heart Muscle Spasms  . Hypertension   . Pancreatic insufficiency     Patient Active Problem List   Diagnosis Date Noted  . Chest pain, precordial 09/07/2017    Class: Acute  . Shortness of breath 09/07/2017  . Acute coronary syndrome (HCC) 09/07/2017    Past Surgical History:  Procedure Laterality Date  . CESAREAN SECTION    . ESOPHAGOGASTRODUODENOSCOPY (EGD) WITH PROPOFOL N/A 01/01/2013   Procedure: ESOPHAGOGASTRODUODENOSCOPY (EGD) WITH PROPOFOL;  Surgeon: Willis Modena, MD;  Location: [facility];  Service: Endoscopy;  Laterality: N/A;  . EUS N/A 01/01/2013   Procedure: ESOPHAGEAL ENDOSCOPIC ULTRASOUND (EUS) RADIAL;  Surgeon: Willis Modena, MD;  Location: [facility];  Service: Endoscopy;  Laterality: N/A;  . LEFT HEART CATH AND CORONARY ANGIOGRAPHY  2010  . NASAL SINUS SURGERY    . TUBAL LIGATION  2001     OB History   No obstetric history on file.     Family History  Problem Relation Age of Onset  . Diabetes Father   . Heart disease Father   . Hyperlipidemia Father     Social History   Tobacco Use  . Smoking status:  Never Smoker  . Smokeless tobacco: Never Used  Substance Use Topics  . Alcohol use: Yes    Comment: occassionally  . Drug use: No    Home Medications Prior to Admission medications   Medication Sig Start Date End Date Taking? Authorizing Provider  amLODipine (NORVASC) 5 MG tablet Take 5 mg by mouth daily before breakfast.     [provider]  aspirin EC 81 MG tablet Take 81 mg by mouth daily.    [provider]  benzonatate (TESSALON) 100 MG capsule Take 1 capsule (100 mg total) by mouth every 8 (eight) hours. 05/08/20   Ankith Edmonston, PA-C  carvedilol (COREG) 3.125 MG tablet Take 1 tablet (3.125 mg total) by mouth 2 (two) times daily with a meal. 09/08/17   Orpah Cobb, MD  diphenhydrAMINE (BENADRYL) 50 MG tablet Take 1 tablet every 4-6 hours as needed for swelling. 09/23/16   Benjiman Core D, PA-C  levothyroxine (SYNTHROID, LEVOTHROID) 50 MCG tablet Take 50 mcg by mouth daily before breakfast.    [provider]  losartan (COZAAR) 100 MG tablet Take 100 mg by mouth daily before breakfast.     [provider]  Multiple Vitamins-Minerals (MULTIVITAMIN WITH MINERALS) tablet Take 1 tablet by mouth daily.    [provider]  omeprazole (PRILOSEC) 10 MG capsule Take 10 mg by mouth daily.    [provider]  Pancrelipase, Lip-Prot-Amyl, (CREON) 24000  UNITS CPEP Take 1-3 capsules by mouth See admin instructions. Takes 2-3 capsules with meals and 1 capsule with snacks.    [provider]  ranitidine (ZANTAC) 150 MG tablet Take 1 tablet (150 mg total) by mouth 2 (two) times daily. 09/23/16   Benjiman Core D, PA-C    Allergies    Tylenol [acetaminophen]  Review of Systems   Review of Systems  Constitutional: Negative for appetite change, chills and fever.  HENT: Negative for ear pain, rhinorrhea, sneezing and sore throat.   Eyes: Negative for photophobia and visual disturbance.  Respiratory: Positive for cough. Negative for  chest tightness, shortness of breath and wheezing.   Cardiovascular: Negative for chest pain and palpitations.  Gastrointestinal: Negative for abdominal pain, blood in stool, constipation, diarrhea, nausea and vomiting.  Genitourinary: Negative for dysuria, hematuria and urgency.  Musculoskeletal: Positive for myalgias.  Skin: Negative for rash.  Neurological: Negative for dizziness, weakness and light-headedness.    Physical Exam Updated Vital Signs BP (!) 138/97   Pulse 84   Temp (!) 97.5 F (36.4 C) (Oral)   Resp 19   Ht 5\' 1"  (1.549 m)   Wt 90.7 kg   SpO2 97%   BMI 37.79 kg/m   Physical Exam Vitals and nursing note reviewed.  Constitutional:      General: She is not in acute distress.    Appearance: She is well-developed.     Comments: Speaking in complete sentences without difficulty.  No signs of respiratory distress.  HENT:     Head: Normocephalic and atraumatic.     Nose: Nose normal.  Eyes:     General: No scleral icterus.       Left eye: No discharge.     Conjunctiva/sclera: Conjunctivae normal.  Cardiovascular:     Rate and Rhythm: Normal rate and regular rhythm.     Heart sounds: Normal heart sounds. No murmur heard.  No friction rub. No gallop.   Pulmonary:     Effort: Pulmonary effort is normal. No respiratory distress.     Breath sounds: Normal breath sounds.  Abdominal:     General: Bowel sounds are normal. There is no distension.     Palpations: Abdomen is soft.     Tenderness: There is no abdominal tenderness. There is no guarding.  Musculoskeletal:        General: Normal range of motion.     Cervical back: Normal range of motion and neck supple.     Comments: Strength 5/5 in bilateral upper and lower extremities.  No objective signs of numbness present.  Normal sensation to light touch.  Compartments are soft.  2+ DP pulses noted bilaterally, 2+ radial pulses noted bilaterally.  Skin:    General: Skin is warm and dry.     Findings: No rash.    Neurological:     Mental Status: She is alert.     Motor: No abnormal muscle tone.     Coordination: Coordination normal.     ED Results / Procedures / Treatments   Labs (all labs ordered are listed, but only abnormal results are displayed) Labs Reviewed  BASIC METABOLIC PANEL - Abnormal; Notable for the following components:      Result Value   Glucose, Bld 113 (*)    All other components within normal limits  CBC  TROPONIN I (HIGH SENSITIVITY)  TROPONIN I (HIGH SENSITIVITY)    EKG EKG Interpretation  Date/Time:  Saturday May 08 2020 06:50:40 EDT Ventricular Rate:  77  PR Interval:    QRS Duration: 91 QT Interval:  355 QTC Calculation: 402 R Axis:   55 Text Interpretation: Sinus rhythm Minimal ST depression, diffuse leads No significant change since 12/09/2019 Confirmed by Geoffery LyonseLo, Douglas ([id number]) on 05/08/2020 8:25:01 AM   Radiology DG Chest 2 View  Result Date: 05/08/2020 CLINICAL DATA:  Chest pain EXAM: CHEST - 2 VIEW COMPARISON:  12/09/2019 FINDINGS: Normal heart size and mediastinal contours. No acute infiltrate or edema. No effusion or pneumothorax. No acute osseous findings. IMPRESSION: Negative chest. Electronically Signed   By: Marnee SpringJonathon  Watts M.D.   On: 05/08/2020 07:20    Procedures Procedures (including critical care time)  Medications Ordered in ED Medications  benzonatate (TESSALON) capsule 200 mg (200 mg Oral Given 05/08/20 0839)  ketorolac (TORADOL) 30 MG/ML injection 15 mg (15 mg Intravenous Given 05/08/20 0844)    ED Course  I have reviewed the triage vital signs and the nursing notes.  Pertinent labs & imaging results that were available during my care of the patient were reviewed by me and considered in my medical decision making (see chart for details).    MDM Rules/Calculators/A&P                          63yo female with a past medical history of GERD, hypertension presenting to the ED with a chief complaint of left-sided body pain for  the past 2 days.  Had her second Covid vaccine on 05/05/2020 and woke up the next day with the symptoms as well as cough productive with mucus.  Pain worse with movement.  Denies shortness of breath, fever or specific chest pain.  No abdominal pain or vomiting.  Here she is afebrile without recent use of antipyretics.  Oxygen saturations maintained above 96% on room air without signs of respiratory distress.  Lungs are clear to auscultation bilaterally.  No weakness or numbness of bilateral upper and lower extremities noted.  Compartments are soft.  EKG shows sinus rhythm, no changes from prior tracings.  Chest x-ray is unremarkable.  CBC, BMP unremarkable.  Troponin negative x2.  Doubt her symptoms are due to ACS, PE, myocarditis or other emergent cause of symptoms.  Suspect viral cause versus side effects from recent vaccination.  She reports improvement of symptoms with medications given here.  Will discharge home with antitussives and have her follow-up with PCP.  Patient is agreeable to the plan.  Strict return precautions given.  All imaging, if done today, including plain films, CT scans, and ultrasounds, independently reviewed by me, and interpretations confirmed via formal radiology reads.  Patient is hemodynamically stable, in NAD, and able to ambulate in the ED. Evaluation does not show pathology that would require ongoing emergent intervention or inpatient treatment. I explained the diagnosis to the patient. Pain has been managed and has no complaints prior to discharge. Patient is comfortable with above plan and is stable for discharge at this time. All questions were answered prior to disposition. Strict return precautions for returning to the ED were discussed. Encouraged follow up with PCP.   An After Visit Summary was printed and given to the patient.   Portions of this note were generated with Scientist, clinical (histocompatibility and immunogenetics)Dragon dictation software. Dictation errors may occur despite best attempts at  proofreading.  Final Clinical Impression(s) / ED Diagnoses Final diagnoses:  Myalgia  Viral URI with cough    Rx / DC Orders ED Discharge Orders  Ordered    benzonatate (TESSALON) 100 MG capsule  Every 8 hours,   Status:  Discontinued        05/08/20 0922    benzonatate (TESSALON) 100 MG capsule  Every 8 hours        05/08/20 0928           Dietrich Pates, PA-C 05/08/20 0175    Geoffery Lyons, MD 05/08/20 1424

## 2020-05-08 NOTE — ED Notes (Signed)
Pt ambulated to x-ray.

## 2020-05-08 NOTE — ED Triage Notes (Signed)
Patient is complaining of chest pain, left arm pain, left leg pain, and having a cough with mucus. Patient states she got her second dose of the covid vaccine. Patient states every since she got it she has felt bad.

## 2020-05-08 NOTE — Discharge Instructions (Signed)
Take medications as needed to help with your symptoms. Follow-up with your primary care provider. Return to the ER if you start to experience worsening chest pain, shortness of breath, leg swelling.

## 2020-06-30 ENCOUNTER — Other Ambulatory Visit: Payer: Self-pay | Admitting: Gastroenterology

## 2020-06-30 DIAGNOSIS — R1012 Left upper quadrant pain: Secondary | ICD-10-CM

## 2020-07-21 ENCOUNTER — Other Ambulatory Visit: Payer: Self-pay

## 2020-07-21 ENCOUNTER — Ambulatory Visit
Admission: RE | Admit: 2020-07-21 | Discharge: 2020-07-21 | Disposition: A | Discharge status: Home / Self Care | Payer: Managed Care, Other (non HMO) | Source: Ambulatory Visit | Attending: Gastroenterology | Admitting: Gastroenterology

## 2020-07-21 DIAGNOSIS — R1012 Left upper quadrant pain: Secondary | ICD-10-CM

## 2020-07-21 MED ORDER — IOPAMIDOL (ISOVUE-300) INJECTION 61%
100.0000 mL | Freq: Once | INTRAVENOUS | Status: AC | PRN
  Administered 2020-07-21: 100 mL via INTRAVENOUS

## 2020-09-03 ENCOUNTER — Other Ambulatory Visit: Payer: Self-pay

## 2020-09-03 ENCOUNTER — Ambulatory Visit (HOSPITAL_COMMUNITY)
Admission: RE | Admit: 2020-09-03 | Discharge: 2020-09-03 | Disposition: A | Discharge status: Home / Self Care | Payer: Managed Care, Other (non HMO) | Source: Ambulatory Visit | Attending: Family Medicine | Admitting: Family Medicine

## 2020-09-03 ENCOUNTER — Other Ambulatory Visit (HOSPITAL_COMMUNITY): Payer: Self-pay | Admitting: Family Medicine

## 2020-09-03 DIAGNOSIS — M79605 Pain in left leg: Secondary | ICD-10-CM | POA: Insufficient documentation

## 2020-09-03 DIAGNOSIS — M7989 Other specified soft tissue disorders: Secondary | ICD-10-CM

## 2020-09-03 NOTE — Progress Notes (Signed)
Left lower extremity venous duplex has been completed. Preliminary results can be found in CV Proc through chart review.  Results were given to [facility] at Dr. Nash Dimmer office.  09/03/20 4:44 PM Olen Cordial RVT

## 2021-05-30 ENCOUNTER — Telehealth: Payer: Self-pay | Admitting: Pulmonary Disease

## 2021-05-30 NOTE — Telephone Encounter (Signed)
Okay to order home sleep test 

## 2021-05-30 NOTE — Telephone Encounter (Signed)
Called and spoke with pt letting her know that AO was okay with [place] ordering HST to be done. Stated to pt that we were at least 8 weeks out in getting pts in here for the HST appt and after saying that to pt, pt stated to hold off on placing the order. Nothing further needed.

## 2021-05-30 NOTE — Telephone Encounter (Signed)
AO please advise on the order for the HST.     Pt was last seen on 03/31/20 and the ONO was denied by her insurance back in 03/2020.    Will we need to schedule another follow up first or ok to order the HST?  Thanks

## 2021-08-22 DIAGNOSIS — J309 Allergic rhinitis, unspecified: Secondary | ICD-10-CM | POA: Diagnosis not present

## 2021-09-01 ENCOUNTER — Telehealth: Payer: Self-pay | Admitting: Pulmonary Disease

## 2021-09-05 DIAGNOSIS — R42 Dizziness and giddiness: Secondary | ICD-10-CM | POA: Diagnosis not present

## 2021-09-05 DIAGNOSIS — R1110 Vomiting, unspecified: Secondary | ICD-10-CM | POA: Diagnosis not present

## 2021-09-20 DIAGNOSIS — G43809 Other migraine, not intractable, without status migrainosus: Secondary | ICD-10-CM | POA: Diagnosis not present

## 2021-09-20 DIAGNOSIS — R0683 Snoring: Secondary | ICD-10-CM | POA: Diagnosis not present

## 2021-09-20 DIAGNOSIS — J301 Allergic rhinitis due to pollen: Secondary | ICD-10-CM | POA: Diagnosis not present

## 2021-10-04 ENCOUNTER — Encounter (HOSPITAL_COMMUNITY): Payer: Self-pay

## 2021-10-04 ENCOUNTER — Emergency Department (HOSPITAL_COMMUNITY): Payer: BC Managed Care – PPO

## 2021-10-04 ENCOUNTER — Emergency Department (HOSPITAL_COMMUNITY)
Admission: EM | Admit: 2021-10-04 | Discharge: 2021-10-04 | Disposition: A | Discharge status: Home / Self Care | Payer: BC Managed Care – PPO | Attending: Student | Admitting: Student

## 2021-10-04 DIAGNOSIS — R0602 Shortness of breath: Secondary | ICD-10-CM | POA: Diagnosis not present

## 2021-10-04 DIAGNOSIS — Z955 Presence of coronary angioplasty implant and graft: Secondary | ICD-10-CM | POA: Insufficient documentation

## 2021-10-04 DIAGNOSIS — X501XXA Overexertion from prolonged static or awkward postures, initial encounter: Secondary | ICD-10-CM | POA: Insufficient documentation

## 2021-10-04 DIAGNOSIS — Z7982 Long term (current) use of aspirin: Secondary | ICD-10-CM | POA: Insufficient documentation

## 2021-10-04 DIAGNOSIS — I6521 Occlusion and stenosis of right carotid artery: Secondary | ICD-10-CM | POA: Diagnosis not present

## 2021-10-04 DIAGNOSIS — Z79899 Other long term (current) drug therapy: Secondary | ICD-10-CM | POA: Insufficient documentation

## 2021-10-04 DIAGNOSIS — S199XXA Unspecified injury of neck, initial encounter: Secondary | ICD-10-CM | POA: Diagnosis not present

## 2021-10-04 DIAGNOSIS — R6884 Jaw pain: Secondary | ICD-10-CM | POA: Diagnosis not present

## 2021-10-04 DIAGNOSIS — M542 Cervicalgia: Secondary | ICD-10-CM | POA: Insufficient documentation

## 2021-10-04 DIAGNOSIS — I10 Essential (primary) hypertension: Secondary | ICD-10-CM | POA: Insufficient documentation

## 2021-10-04 DIAGNOSIS — M47812 Spondylosis without myelopathy or radiculopathy, cervical region: Secondary | ICD-10-CM | POA: Diagnosis not present

## 2021-10-04 DIAGNOSIS — R0789 Other chest pain: Secondary | ICD-10-CM | POA: Diagnosis not present

## 2021-10-04 LAB — BASIC METABOLIC PANEL
Anion gap: 7 (ref 5–15)
BUN: 19 mg/dL (ref 8–23)
CO2: 27 mmol/L (ref 22–32)
Calcium: 9.1 mg/dL (ref 8.9–10.3)
Chloride: 106 mmol/L (ref 98–111)
Creatinine, Ser: 0.74 mg/dL (ref 0.44–1.00)
GFR, Estimated: >60 mL/min (ref >60)
Glucose, Bld: 145 mg/dL — ABNORMAL HIGH (ref 70–99)
Potassium: 3.6 mmol/L (ref 3.5–5.1)
Sodium: 140 mmol/L (ref 135–145)

## 2021-10-04 LAB — CBC WITH DIFFERENTIAL/PLATELET
Abs Immature Granulocytes: 0.03 10*3/uL (ref 0.00–0.07)
Basophils Absolute: 0.1 10*3/uL (ref 0.0–0.1)
Basophils Relative: 1 %
Eosinophils Absolute: 0.1 10*3/uL (ref 0.0–0.5)
Eosinophils Relative: 2 %
HCT: 41.4 % (ref 36.0–46.0)
Hemoglobin: 13 g/dL (ref 12.0–15.0)
Immature Granulocytes: 0 %
Lymphocytes Relative: 24 %
Lymphs Abs: 1.8 10*3/uL (ref 0.7–4.0)
MCH: 25.6 pg — ABNORMAL LOW (ref 26.0–34.0)
MCHC: 31.4 g/dL (ref 30.0–36.0)
MCV: 81.7 fL (ref 80.0–100.0)
Monocytes Absolute: 0.5 10*3/uL (ref 0.1–1.0)
Monocytes Relative: 7 %
Neutro Abs: 4.8 10*3/uL (ref 1.7–7.7)
Neutrophils Relative %: 66 %
Platelets: 258 10*3/uL (ref 150–400)
RBC: 5.07 MIL/uL (ref 3.87–5.11)
RDW: 14.6 % (ref 11.5–15.5)
WBC: 7.3 10*3/uL (ref 4.0–10.5)
nRBC: 0 % (ref 0.0–0.2)

## 2021-10-04 LAB — TROPONIN I (HIGH SENSITIVITY)
Troponin I (High Sensitivity): 2 ng/L (ref <18)
Troponin I (High Sensitivity): 2 ng/L (ref <18)

## 2021-10-04 MED ORDER — IOHEXOL 350 MG/ML SOLN
75.0000 mL | Freq: Once | INTRAVENOUS | Status: AC | PRN
  Administered 2021-10-04: 75 mL via INTRAVENOUS

## 2021-10-04 NOTE — ED Triage Notes (Signed)
Pt arrived via POV, c/o sudden onset, SOB and jaw pain. States she felt she was "gasping" for air. Denies any chest pain.

## 2021-10-04 NOTE — ED Provider Triage Note (Addendum)
Emergency Medicine Provider Triage Evaluation Note  [name] , a 66 y.o. female  was evaluated in triage.  Pt complains of right-sided "jaw" pain and shortness of breath.  States she was driving and felt sudden onset of symptoms.  Located to right posterior aspect jaw over right carotids.  She denies any known trauma or injury.  She denies any sudden onset headache, numbness, weakness.  She felt like she was "gasping for air."  She states symptoms have improved her she still feels mildly short of breath and has moderate tenderness over right carotid.  No cough, chest pain.  She denies any prior history of PE or DVT.  Review of Systems  Positive: Jaw pain, SOB Negative: Neck pain, cp, numbness, weakness  Physical Exam  There were no vitals taken for this visit. Gen:   Awake, no distress   Resp:  Normal effort  Face:  No drooling, trismus, dysphagia Neck:  Full ROM without difficulty. Moderate tenderness over right carotid MSK:   Moves extremities without difficulty  Other:    Medical Decision Making  Medically screening exam initiated at 4:31 PM.  Appropriate orders placed.  [place] Kepreades was informed that the remainder of the evaluation will be completed by another provider, this initial triage assessment does not replace that evaluation, and the importance of remaining in the ED until their evaluation is complete.  Here for evaluation of right-sided "jaw" pain and some onset shortness of breath however on exam patient points over her carotids where she has moderate tenderness. Will plan on labs and imagig    Salimata Christenson A, PA-C 10/04/21 1635

## 2021-10-04 NOTE — ED Provider Notes (Signed)
[facility] Provider Note  CSN: [id number] Arrival date & time: 10/04/21 1612  Chief Complaint(s) Shortness of Breath and Jaw Pain  HPI [name] is a 66 y.o. female with PMH GERD, HTN who presents emergency department for evaluation of jaw pain and shortness of breath.  Patient states that she was eating a chicken salad sandwich with bacon today and had acute onset right-sided jaw and neck pain where she felt a pop and had associated shortness of breath.  The symptoms have since resolved but she came to the emergency department for further evaluation.  She denies chest pain, abdominal pain, nausea, vomiting, diaphoresis, headache, fever or any other systemic symptoms.  Denies vision loss, numbness, tingling, weakness or other neurologic complaints.   Shortness of Breath  Past Medical History Past Medical History:  Diagnosis Date   GERD (gastroesophageal reflux disease)    Heart disorder    Heart Muscle Spasms   Hypertension    Pancreatic insufficiency    Patient Active Problem List   Diagnosis Date Noted   Chest pain, precordial 09/07/2017    Class: Acute   Shortness of breath 09/07/2017   Acute coronary syndrome (HCC) 09/07/2017   Home Medication(s) Prior to Admission medications   Medication Sig Start Date End Date Taking? Authorizing Provider  amLODipine (NORVASC) 5 MG tablet Take 5 mg by mouth daily before breakfast.     [provider]  aspirin EC 81 MG tablet Take 81 mg by mouth daily.    [provider]  benzonatate (TESSALON) 100 MG capsule Take 1 capsule (100 mg total) by mouth every 8 (eight) hours. 05/08/20   Khatri, Hina, PA-C  carvedilol (COREG) 3.125 MG tablet Take 1 tablet (3.125 mg total) by mouth 2 (two) times daily with a meal. 09/08/17   Orpah CobbKadakia, Ajay, MD  diphenhydrAMINE (BENADRYL) 50 MG tablet Take 1 tablet every 4-6 hours as needed for swelling. 09/23/16   Benjiman CoreWiseman, Brittany D, PA-C  levothyroxine  (SYNTHROID, LEVOTHROID) 50 MCG tablet Take 50 mcg by mouth daily before breakfast.    [provider]  losartan (COZAAR) 100 MG tablet Take 100 mg by mouth daily before breakfast.     [provider]  Multiple Vitamins-Minerals (MULTIVITAMIN WITH MINERALS) tablet Take 1 tablet by mouth daily.    [provider]  omeprazole (PRILOSEC) 10 MG capsule Take 10 mg by mouth daily.    [provider]  Pancrelipase, Lip-Prot-Amyl, (CREON) 24000 UNITS CPEP Take 1-3 capsules by mouth See admin instructions. Takes 2-3 capsules with meals and 1 capsule with snacks.    [provider]  ranitidine (ZANTAC) 150 MG tablet Take 1 tablet (150 mg total) by mouth 2 (two) times daily. 09/23/16   Magdalene RiverWiseman, Brittany D, PA-C  Past Surgical History Past Surgical History:  Procedure Laterality Date   CESAREAN SECTION     ESOPHAGOGASTRODUODENOSCOPY (EGD) WITH PROPOFOL N/A 01/01/2013   Procedure: ESOPHAGOGASTRODUODENOSCOPY (EGD) WITH PROPOFOL;  Surgeon: Willis Modena, MD;  Location: [facility];  Service: Endoscopy;  Laterality: N/A;   EUS N/A 01/01/2013   Procedure: ESOPHAGEAL ENDOSCOPIC ULTRASOUND (EUS) RADIAL;  Surgeon: Willis Modena, MD;  Location: [facility];  Service: Endoscopy;  Laterality: N/A;   LEFT HEART CATH AND CORONARY ANGIOGRAPHY  2010   NASAL SINUS SURGERY     TUBAL LIGATION  2001   Family History Family History  Problem Relation Age of Onset   Diabetes Father    Heart disease Father    Hyperlipidemia Father     Social History Social History   Tobacco Use   Smoking status: Never   Smokeless tobacco: Never  Substance Use Topics   Alcohol use: Yes    Comment: occassionally   Drug use: No   Allergies Tylenol [acetaminophen]  Review of Systems Review of Systems  Respiratory:  Positive for shortness of breath.     Physical Exam Vital Signs  I have reviewed the triage vital signs BP (!) 159/91    Pulse 72    Temp 98.2 F (36.8 C) (Oral)    Resp 16    SpO2 99%   Physical Exam Vitals and nursing note reviewed.  Constitutional:      General: She is not in acute distress.    Appearance: She is well-developed.  HENT:     Head: Normocephalic and atraumatic.  Eyes:     Conjunctiva/sclera: Conjunctivae normal.  Cardiovascular:     Rate and Rhythm: Normal rate and regular rhythm.     Heart sounds: No murmur heard. Pulmonary:     Effort: Pulmonary effort is normal. No respiratory distress.     Breath sounds: Normal breath sounds.  Abdominal:     Palpations: Abdomen is soft.     Tenderness: There is no abdominal tenderness.  Musculoskeletal:        General: No swelling.     Cervical back: Neck supple.  Skin:    General: Skin is warm and dry.     Capillary Refill: Capillary refill takes less than 2 seconds.  Neurological:     Mental Status: She is alert.  Psychiatric:        Mood and Affect: Mood normal.    ED Results and Treatments Labs (all labs ordered are listed, but only abnormal results are displayed) Labs Reviewed  CBC WITH DIFFERENTIAL/PLATELET - Abnormal; Notable for the following components:      Result Value   MCH 25.6 (*)    All other components within normal limits  BASIC METABOLIC PANEL - Abnormal; Notable for the following components:   Glucose, Bld 145 (*)    All other components within normal limits  TROPONIN I (HIGH SENSITIVITY)  TROPONIN I (HIGH SENSITIVITY)  Radiology DG Chest 2 View  Result Date: 10/04/2021 CLINICAL DATA:  Shortness of breath. EXAM: CHEST - 2 VIEW COMPARISON:  05/08/2020 FINDINGS: The cardiomediastinal contours are normal. Atherosclerosis of the aortic arch. The lungs are clear. Pulmonary vasculature is normal. No  consolidation, pleural effusion, or pneumothorax. No acute osseous abnormalities are seen. IMPRESSION: No acute chest findings or explanation for shortness of breath. Electronically Signed   By: Narda Rutherford M.D.   On: 10/04/2021 18:03   CT Angio Neck W and/or Wo Contrast  Result Date: 10/04/2021 CLINICAL DATA:  Neck trauma, arterial injury suspected right EXAM: CT ANGIOGRAPHY NECK TECHNIQUE: Multidetector CT imaging of the neck was performed using the standard protocol during bolus administration of intravenous contrast. Multiplanar CT image reconstructions and MIPs were obtained to evaluate the vascular anatomy. Carotid stenosis measurements (when applicable) are obtained utilizing NASCET criteria, using the distal internal carotid diameter as the denominator. RADIATION DOSE REDUCTION: This exam was performed according to the departmental dose-optimization program which includes automated exposure control, adjustment of the mA and/or kV according to patient size and/or use of iterative reconstruction technique. CONTRAST:  23mL OMNIPAQUE IOHEXOL 350 MG/ML SOLN COMPARISON:  None. FINDINGS: Aortic arch: Great vessel origins are patent. Right carotid system: Patent. Trace calcified plaque at the bifurcation. No stenosis or evidence of dissection. Left carotid system: Patent.  No stenosis or evidence of dissection. Vertebral arteries: Patent. Left vertebral is mildly dominant. No stenosis or evidence of dissection. Skeleton: Advanced degenerative changes of the cervical spine. Temporomandibular joints are unremarkable. Other neck: Unremarkable. Upper chest: Included upper lungs are clear. IMPRESSION: No acute or significant vascular abnormality. Electronically Signed   By: Guadlupe Spanish M.D.   On: 10/04/2021 19:11    Pertinent labs & imaging results that were available during my care of the patient were reviewed by me and considered in my medical decision making (see MDM for details).  Medications Ordered  in ED Medications  iohexol (OMNIPAQUE) 350 MG/ML injection 75 mL (75 mLs Intravenous Contrast Given 10/04/21 1851)                                                                                                                                     Procedures Procedures  (including critical care time)  Medical Decision Making / ED Course   This patient presents to the ED for concern of jaw pain, shortness of breath, this involves an extensive number of treatment options, and is a complaint that carries with it a high risk of complications and morbidity.  The differential diagnosis includes ACS, carotid dissection, TMJ, anxiety, temporal arteritis  MDM: Patient seen emergency room for evaluation of jaw pain and shortness of breath.  Physical exam is unremarkable.  Laboratory evaluation including high-sensitivity troponin and delta troponin unremarkable.  ECG nonischemic, chest x-ray negative.  CT angio of the neck was performed to rule out carotid dissection which was reassuringly negative.  On  reevaluation, patient symptoms remain completely resolved.  Patient states that this is happened before and she had a negative work-up at that time as well.  She has an outpatient cardiologist and states that she will follow-up outpatient.  Patient's heart score is 3.   Additional history obtained: -Additional history obtained from husband -External records from outside source obtained and reviewed including: Chart review including previous notes, labs, imaging, consultation notes   Lab Tests: -I ordered, reviewed, and interpreted labs.   The pertinent results include:   Labs Reviewed  CBC WITH DIFFERENTIAL/PLATELET - Abnormal; Notable for the following components:      Result Value   MCH 25.6 (*)    All other components within normal limits  BASIC METABOLIC PANEL - Abnormal; Notable for the following components:   Glucose, Bld 145 (*)    All other components within normal limits  TROPONIN I  (HIGH SENSITIVITY)  TROPONIN I (HIGH SENSITIVITY)      EKG   EKG Interpretation  Date/Time:  Tuesday October 04 2021 16:30:08 EST Ventricular Rate:  77 PR Interval:  162 QRS Duration: 92 QT Interval:  367 QTC Calculation: 416 R Axis:   60 Text Interpretation: Sinus rhythm Borderline repolarization abnormality Confirmed by Surah Pelley ([id number]) on 10/04/2021 8:44:56 PM         Imaging Studies ordered: I ordered imaging studies including CXR, CT neck I independently visualized and interpreted imaging. I agree with the radiologist interpretation   Medicines ordered and prescription drug management: Meds ordered this encounter  Medications   iohexol (OMNIPAQUE) 350 MG/ML injection 75 mL    -I have reviewed the patients home medicines and have made adjustments as needed  Critical interventions none   Cardiac Monitoring: The patient was maintained on a cardiac monitor.  I personally viewed and interpreted the cardiac monitored which showed an underlying rhythm of: NSR  Social Determinants of Health:  Factors impacting patients care include: none   Reevaluation: After the interventions noted above, I reevaluated the patient and found that they have :improved  Co morbidities that complicate the patient evaluation  Past Medical History:  Diagnosis Date   GERD (gastroesophageal reflux disease)    Heart disorder    Heart Muscle Spasms   Hypertension    Pancreatic insufficiency       Dispostion: I considered admission for this patient, but with a heart score of 3, no active chest pain or dyspnea, negative laboratory and imaging work-up, patient safe for outpatient follow-up.     Final Clinical Impression(s) / ED Diagnoses Final diagnoses:  None     @PCDICTATION @    Glendora Score, MD 10/04/21 2210

## 2021-10-06 NOTE — Telephone Encounter (Signed)
Nothing noted in message. Will close encounter.  

## 2021-10-08 DIAGNOSIS — R42 Dizziness and giddiness: Secondary | ICD-10-CM | POA: Diagnosis not present

## 2021-10-10 DIAGNOSIS — M1711 Unilateral primary osteoarthritis, right knee: Secondary | ICD-10-CM | POA: Diagnosis not present

## 2021-10-24 ENCOUNTER — Ambulatory Visit: Payer: BC Managed Care – PPO | Admitting: Pulmonary Disease

## 2021-10-24 ENCOUNTER — Other Ambulatory Visit: Payer: Self-pay

## 2021-10-24 ENCOUNTER — Encounter: Payer: Self-pay | Admitting: Pulmonary Disease

## 2021-10-24 VITALS — BP 122/78 | HR 90 | Temp 98.4°F | Ht 61.0 in | Wt 218.0 lb

## 2021-10-24 DIAGNOSIS — G4733 Obstructive sleep apnea (adult) (pediatric): Secondary | ICD-10-CM

## 2021-10-24 DIAGNOSIS — R0602 Shortness of breath: Secondary | ICD-10-CM | POA: Diagnosis not present

## 2021-10-24 NOTE — Patient Instructions (Signed)
Moderate probability of significant obstructive sleep apnea ? ?Schedule you for home sleep study ? ?Tentative follow-up in 3 months ? ?Call with significant concerns ? ?Continue weight loss efforts ? ? ?Sleep Apnea ?Sleep apnea affects breathing during sleep. It causes breathing to stop for 10 seconds or more, or to become shallow. People with sleep apnea usually snore loudly. ?It can also increase the risk of: ?Heart attack. ?Stroke. ?Being very overweight (obese). ?Diabetes. ?Heart failure. ?Irregular heartbeat. ?High blood pressure. ?The goal of treatment is to help you breathe normally again. ?What are the causes? ?The most common cause of this condition is a collapsed or blocked airway. ?There are three kinds of sleep apnea: ?Obstructive sleep apnea. This is caused by a blocked or collapsed airway. ?Central sleep apnea. This happens when the brain does not send the right signals to the muscles that control breathing. ?Mixed sleep apnea. This is a combination of obstructive and central sleep apnea. ?What increases the risk? ?Being overweight. ?Smoking. ?Having a small airway. ?Being older. ?Being female. ?Drinking alcohol. ?Taking medicines to calm yourself (sedatives or tranquilizers). ?Having family members with the condition. ?Having a tongue or tonsils that are larger than normal. ?What are the signs or symptoms? ?Trouble staying asleep. ?Loud snoring. ?Headaches in the morning. ?Waking up gasping. ?Dry mouth or sore throat in the morning. ?Being sleepy or tired during the day. ?If you are sleepy or tired during the day, you may also: ?Not be able to focus your mind (concentrate). ?Forget things. ?Get angry a lot and have mood swings. ?Feel sad (depressed). ?Have changes in your personality. ?Have less interest in sex, if you are female. ?Be unable to have an erection, if you are female. ?How is this treated? ? ?Sleeping on your side. ?Using a medicine to get rid of mucus in your nose (decongestant). ?Avoiding  the use of alcohol, medicines to help you relax, or certain pain medicines (narcotics). ?Losing weight, if needed. ?Changing your diet. ?Quitting smoking. ?Using a machine to open your airway while you sleep, such as: ?An oral appliance. This is a mouthpiece that shifts your lower jaw forward. ?A CPAP device. This device blows air through a mask when you breathe out (exhale). ?An EPAP device. This has valves that you put in each nostril. ?A BIPAP device. This device blows air through a mask when you breathe in (inhale) and breathe out. ?Having surgery if other treatments do not work. ?Follow these instructions at home: ?Lifestyle ?Make changes that your doctor recommends. ?Eat a healthy diet. ?Lose weight if needed. ?Avoid alcohol, medicines to help you relax, and some pain medicines. ?Do not smoke or use any products that contain nicotine or tobacco. If you need help quitting, ask your doctor. ?General instructions ?Take over-the-counter and prescription medicines only as told by your doctor. ?If you were given a machine to use while you sleep, use it only as told by your doctor. ?If you are having surgery, make sure to tell your doctor you have sleep apnea. You may need to bring your device with you. ?Keep all follow-up visits. ?Contact a doctor if: ?The machine that you were given to use during sleep bothers you or does not seem to be working. ?You do not get better. ?You get worse. ?Get help right away if: ?Your chest hurts. ?You have trouble breathing in enough air. ?You have an uncomfortable feeling in your back, arms, or stomach. ?You have trouble talking. ?One side of your body feels weak. ?A part of  your face is hanging down. ?These symptoms may be an emergency. Get help right away. Call your local emergency services (911 in the [place] ?Do not wait to see if the symptoms will go away. ?Do not drive yourself to the hospital. ?Summary ?This condition affects breathing during sleep. ?The most common cause is a  collapsed or blocked airway. ?The goal of treatment is to help you breathe normally while you sleep. ?This information is not intended to replace advice given to you by your health care provider. Make sure you discuss any questions you have with your health care provider. ?Document Revised: 03/09/2021 Document Reviewed: 07/09/2020 ?Elsevier Patient Education ? 2022 [employer] ? ?

## 2021-10-24 NOTE — Progress Notes (Signed)
? ?      ?[place]    [id number]    08-29-1955 ? ?Primary Care Physician:Wong, Maryla Morrow, MD ? ?Referring Physician: Daisy Floro, MD ?[address] ?[place],  [place] [address] ? ?Chief complaint:   ?Patient with a history of snoring ?Continuing concern for obstructive sleep apnea ? ?HPI: ? ?History of snoring ?Was seen about 2 years ago, was not able to complete a sleep study at the time ? ?Feels she is still not sleeping very well ?Usually tries to go to bed about 10, wake up time about 7:30 in the morning ?About 1-2 awakenings ?Sleep is nonrestorative and feels like ? ?Drinks caffeinated beverage in the morning ? ?Weight has been relatively stable ? ?Dad did snore significantly ? ?She does have dryness of her mouth in the morning ? ?Background history of hypertension. ? ?Never smoker ? ?Recently retired ?She does have a bad knee that may need replacement at some point ? ?Outpatient Encounter Medications as of 10/24/2021  ?Medication Sig  ? amLODipine (NORVASC) 5 MG tablet Take 5 mg by mouth daily before breakfast.   ? aspirin EC 81 MG tablet Take 81 mg by mouth daily.  ? Azelastine HCl 137 MCG/SPRAY SOLN Place 2 sprays into both nostrils 2 (two) times daily.  ? carvedilol (COREG) 3.125 MG tablet Take 1 tablet (3.125 mg total) by mouth 2 (two) times daily with a meal.  ? diphenhydrAMINE (BENADRYL) 50 MG tablet Take 1 tablet every 4-6 hours as needed for swelling.  ? fluticasone (FLONASE) 50 MCG/ACT nasal spray Place into both nostrils.  ? ipratropium (ATROVENT) 0.03 % nasal spray Place into both nostrils.  ? levothyroxine (SYNTHROID, LEVOTHROID) 50 MCG tablet Take 50 mcg by mouth daily before breakfast.  ? losartan (COZAAR) 100 MG tablet Take 100 mg by mouth daily before breakfast.   ? meclizine (ANTIVERT) 25 MG tablet Take 25 mg by mouth 2 (two) times daily as needed.  ? Multiple Vitamins-Minerals (MULTIVITAMIN WITH MINERALS) tablet Take 1 tablet by mouth daily.  ? omeprazole (PRILOSEC)  10 MG capsule Take 10 mg by mouth daily.  ? ondansetron (ZOFRAN-ODT) 4 MG disintegrating tablet Take 4 mg by mouth 2 (two) times daily as needed.  ? Pancrelipase, Lip-Prot-Amyl, 24000-76000 units CPEP Take 1-3 capsules by mouth See admin instructions. Takes 2-3 capsules with meals and 1 capsule with snacks.  ? ranitidine (ZANTAC) 150 MG tablet Take 1 tablet (150 mg total) by mouth 2 (two) times daily.  ? [DISCONTINUED] benzonatate (TESSALON) 100 MG capsule Take 1 capsule (100 mg total) by mouth every 8 (eight) hours. (Patient not taking: Reported on 10/24/2021)  ? ?No facility-administered encounter medications on file as of 10/24/2021.  ? ? ?Allergies as of 10/24/2021 - Review Complete 10/24/2021  ?Allergen Reaction Noted  ? Tylenol [acetaminophen] Other (See Comments) 03/16/2012  ? ? ?Past Medical History:  ?Diagnosis Date  ? GERD (gastroesophageal reflux disease)   ? Heart disorder   ? Heart Muscle Spasms  ? Hypertension   ? Pancreatic insufficiency   ? ? ?Past Surgical History:  ?Procedure Laterality Date  ? CESAREAN SECTION    ? ESOPHAGOGASTRODUODENOSCOPY (EGD) WITH PROPOFOL N/A 01/01/2013  ? Procedure: ESOPHAGOGASTRODUODENOSCOPY (EGD) WITH PROPOFOL;  Surgeon: Willis Modena, MD;  Location: [facility];  Service: Endoscopy;  Laterality: N/A;  ? EUS N/A 01/01/2013  ? Procedure: ESOPHAGEAL ENDOSCOPIC ULTRASOUND (EUS) RADIAL;  Surgeon: Willis Modena, MD;  Location: [facility];  Service: Endoscopy;  Laterality: N/A;  ? LEFT HEART  CATH AND CORONARY ANGIOGRAPHY  2010  ? NASAL SINUS SURGERY    ? TUBAL LIGATION  2001  ? ? ?Family History  ?Problem Relation Age of Onset  ? Diabetes Father   ? Heart disease Father   ? Hyperlipidemia Father   ? ? ?Social History  ? ?Socioeconomic History  ? Marital status: Single  ?  Spouse name: Not on file  ? Number of children: Not on file  ? Years of education: Not on file  ? Highest education level: Not on file  ?Occupational History  ? Not on file  ?Tobacco Use  ? Smoking status:  Never  ? Smokeless tobacco: Never  ?Substance and Sexual Activity  ? Alcohol use: Yes  ?  Comment: occassionally  ? Drug use: No  ? Sexual activity: Not on file  ?Other Topics Concern  ? Not on file  ?Social History Narrative  ? Not on file  ? ?Social Determinants of Health  ? ?Financial Resource Strain: Not on file  ?Food Insecurity: Not on file  ?Transportation Needs: Not on file  ?Physical Activity: Not on file  ?Stress: Not on file  ?Social Connections: Not on file  ?Intimate Partner Violence: Not on file  ? ? ?Review of Systems  ?Constitutional:  Positive for fatigue.  ?Cardiovascular: Negative.   ?Gastrointestinal: Negative.   ?Psychiatric/Behavioral:  Positive for sleep disturbance.   ? ?Vitals:  ? 10/24/21 1150  ?BP: 122/78  ?Pulse: 90  ?Temp: 98.4 ?F (36.9 ?C)  ?SpO2: 96%  ? ? ? ?Physical Exam ?Constitutional:   ?   Appearance: She is obese.  ?HENT:  ?   Head: Normocephalic.  ?   Nose: Nose normal.  ?   Mouth/Throat:  ?   Mouth: Mucous membranes are moist.  ?   Comments: Mallampati 2, crowded oropharynx ?Eyes:  ?   Pupils: Pupils are equal, round, and reactive to light.  ?Cardiovascular:  ?   Rate and Rhythm: Normal rate.  ?   Pulses: Normal pulses.  ?   Heart sounds: Normal heart sounds. No murmur heard. ?  No friction rub.  ?Pulmonary:  ?   Effort: Pulmonary effort is normal. No respiratory distress.  ?   Breath sounds: Normal breath sounds. No stridor. No wheezing or rhonchi.  ?Musculoskeletal:  ?   Cervical back: No rigidity or tenderness.  ?Skin: ?   General: Skin is warm.  ?Neurological:  ?   General: No focal deficit present.  ?   Mental Status: She is alert.  ? ?CT chest from February 2021 shows no infiltrative process ? ?Results of the Epworth flowsheet 03/31/2020  ?Sitting and reading 0  ?Watching TV 2  ?Sitting, inactive in a public place (e.g. a theatre or a meeting) 1  ?As a passenger in a car for an hour without a break 0  ?Lying down to rest in the afternoon when circumstances permit 2   ?Sitting and talking to someone 0  ?Sitting quietly after a lunch without alcohol 0  ?In a car, while stopped for a few minutes in traffic 0  ?Total score 5  ? ?Assessment:  ?Moderate probability of significant obstructive sleep apnea ? ?Nonrestorative sleep ? ?Poor sleep quality ? ?Obesity ? ?Pathophysiology of sleep disordered breathing discussed with the patient ?Treatment options for sleep disordered breathing discussed with the patient ? ?An in lab study was denied previously ?Failure rate for home sleep study was discussed ? ?Importance of treating significant sleep disordered breathing discussed ? ?Plan/Recommendations: ? ?  Schedule patient for home sleep study ? ?Risk of not treating sleep disordered breathing discussed with patient ? ?Encouraged weight loss efforts ? ?Daytime symptoms likely related to poor sleep quality and untreated sleep disordered breathing ? ?Follow-up in 3 months ? ?Virl Diamond MD ?[facility] ?10/24/2021, 12:44 PM ? ?CC: Daisy Floro, MD ? ? ?

## 2021-10-26 DIAGNOSIS — Z01419 Encounter for gynecological examination (general) (routine) without abnormal findings: Secondary | ICD-10-CM | POA: Diagnosis not present

## 2021-10-26 DIAGNOSIS — Z6841 Body Mass Index (BMI) 40.0 and over, adult: Secondary | ICD-10-CM | POA: Diagnosis not present

## 2021-10-26 DIAGNOSIS — R350 Frequency of micturition: Secondary | ICD-10-CM | POA: Diagnosis not present

## 2021-10-26 DIAGNOSIS — Z1151 Encounter for screening for human papillomavirus (HPV): Secondary | ICD-10-CM | POA: Diagnosis not present

## 2021-10-26 DIAGNOSIS — Z124 Encounter for screening for malignant neoplasm of cervix: Secondary | ICD-10-CM | POA: Diagnosis not present

## 2021-11-04 ENCOUNTER — Telehealth: Payer: Self-pay | Admitting: Pulmonary Disease

## 2021-11-04 NOTE — Telephone Encounter (Signed)
Called and spoke with patient. She confirmed that she was calling in regards to her HST. Per patient, he was told that the sleep study would be in as an urgent request. I reviewed her chart and did not see any mentioning of an urgent sleep study. She does not want to wait the 10-12 weeks. I told her the only way we would order a HST would be if it was needed for major surgery, which she is not having.  ? ?AO, do you remember talking to her about an urgent sleep study?  ?

## 2021-11-04 NOTE — Telephone Encounter (Signed)
Pt called to inquire about HST and when it would be scheduled as she was seen on 3/14. I informed her that the PCCs would be reaching out to her and that HST are about 10-12 weeks out from time of order. Pt became argumentative, told me I was wrong and that the nurse told her it would be urgent and she would be scheduled in 2-3 weeks. Routing to triage and PCCs to keep everyone in the loop. Pt asked for nurse to call her back and discuss.  ?

## 2021-11-07 NOTE — Telephone Encounter (Signed)
Sleep study can be done as soon as possible ? ?Systemic problems beyond my control. ?

## 2021-11-08 DIAGNOSIS — K8681 Exocrine pancreatic insufficiency: Secondary | ICD-10-CM | POA: Diagnosis not present

## 2021-11-08 DIAGNOSIS — K5730 Diverticulosis of large intestine without perforation or abscess without bleeding: Secondary | ICD-10-CM | POA: Diagnosis not present

## 2021-11-08 DIAGNOSIS — K219 Gastro-esophageal reflux disease without esophagitis: Secondary | ICD-10-CM | POA: Diagnosis not present

## 2021-11-08 NOTE — Telephone Encounter (Signed)
Patient is aware of below message and voiced her understanding.  Nothing further needed.   

## 2021-12-06 DIAGNOSIS — K8681 Exocrine pancreatic insufficiency: Secondary | ICD-10-CM | POA: Diagnosis not present

## 2021-12-22 DIAGNOSIS — U071 COVID-19: Secondary | ICD-10-CM | POA: Diagnosis not present

## 2021-12-22 DIAGNOSIS — Z20822 Contact with and (suspected) exposure to covid-19: Secondary | ICD-10-CM | POA: Diagnosis not present

## 2021-12-29 DIAGNOSIS — K5730 Diverticulosis of large intestine without perforation or abscess without bleeding: Secondary | ICD-10-CM | POA: Diagnosis not present

## 2021-12-29 DIAGNOSIS — K8681 Exocrine pancreatic insufficiency: Secondary | ICD-10-CM | POA: Diagnosis not present

## 2021-12-29 DIAGNOSIS — K219 Gastro-esophageal reflux disease without esophagitis: Secondary | ICD-10-CM | POA: Diagnosis not present

## 2022-01-16 DIAGNOSIS — R7303 Prediabetes: Secondary | ICD-10-CM | POA: Diagnosis not present

## 2022-01-16 DIAGNOSIS — E7800 Pure hypercholesterolemia, unspecified: Secondary | ICD-10-CM | POA: Diagnosis not present

## 2022-01-16 DIAGNOSIS — Z13220 Encounter for screening for lipoid disorders: Secondary | ICD-10-CM | POA: Diagnosis not present

## 2022-01-16 DIAGNOSIS — R0981 Nasal congestion: Secondary | ICD-10-CM | POA: Diagnosis not present

## 2022-01-16 DIAGNOSIS — I10 Essential (primary) hypertension: Secondary | ICD-10-CM | POA: Diagnosis not present

## 2022-01-16 DIAGNOSIS — E039 Hypothyroidism, unspecified: Secondary | ICD-10-CM | POA: Diagnosis not present

## 2022-01-16 DIAGNOSIS — Z0000 Encounter for general adult medical examination without abnormal findings: Secondary | ICD-10-CM | POA: Diagnosis not present

## 2022-01-17 ENCOUNTER — Encounter: Payer: Self-pay | Admitting: Pulmonary Disease

## 2022-01-20 DIAGNOSIS — B353 Tinea pedis: Secondary | ICD-10-CM | POA: Diagnosis not present

## 2022-01-23 DIAGNOSIS — R21 Rash and other nonspecific skin eruption: Secondary | ICD-10-CM | POA: Diagnosis not present

## 2022-02-06 DIAGNOSIS — J301 Allergic rhinitis due to pollen: Secondary | ICD-10-CM | POA: Diagnosis not present

## 2022-02-06 DIAGNOSIS — R0683 Snoring: Secondary | ICD-10-CM | POA: Diagnosis not present

## 2022-02-06 DIAGNOSIS — G479 Sleep disorder, unspecified: Secondary | ICD-10-CM | POA: Diagnosis not present

## 2022-02-07 DIAGNOSIS — R7303 Prediabetes: Secondary | ICD-10-CM | POA: Diagnosis not present

## 2022-02-07 DIAGNOSIS — G479 Sleep disorder, unspecified: Secondary | ICD-10-CM | POA: Diagnosis not present

## 2022-02-07 DIAGNOSIS — E039 Hypothyroidism, unspecified: Secondary | ICD-10-CM | POA: Diagnosis not present

## 2022-02-07 DIAGNOSIS — I10 Essential (primary) hypertension: Secondary | ICD-10-CM | POA: Diagnosis not present

## 2022-02-13 DIAGNOSIS — Z13820 Encounter for screening for osteoporosis: Secondary | ICD-10-CM | POA: Diagnosis not present

## 2022-02-13 DIAGNOSIS — Z1231 Encounter for screening mammogram for malignant neoplasm of breast: Secondary | ICD-10-CM | POA: Diagnosis not present

## 2022-02-15 ENCOUNTER — Ambulatory Visit: Payer: BC Managed Care – PPO

## 2022-02-15 DIAGNOSIS — G4733 Obstructive sleep apnea (adult) (pediatric): Secondary | ICD-10-CM

## 2022-02-20 DIAGNOSIS — L309 Dermatitis, unspecified: Secondary | ICD-10-CM | POA: Diagnosis not present

## 2022-03-02 ENCOUNTER — Telehealth: Payer: Self-pay | Admitting: Pulmonary Disease

## 2022-03-02 DIAGNOSIS — G4733 Obstructive sleep apnea (adult) (pediatric): Secondary | ICD-10-CM | POA: Diagnosis not present

## 2022-03-02 NOTE — Telephone Encounter (Signed)
Call patient  Sleep study result  Date of study: 02/15/2022  Impression: Severe obstructive sleep apnea Mild oxygen desaturations  Recommendation: DME referral  Recommend CPAP therapy for severe obstructive sleep apnea  Auto titrating CPAP with pressure settings of 5-20 will be appropriate  Encourage weight loss measures  Follow-up in the office 4 to 6 weeks following initiation of treatment

## 2022-03-02 NOTE — Telephone Encounter (Signed)
I called the patient and gave her the results and she requested an OV to go over the information in more detail. She is aware of the follow up time and when to come in the office. Nothing further needed.

## 2022-03-08 ENCOUNTER — Encounter: Payer: Self-pay | Admitting: Nurse Practitioner

## 2022-03-08 ENCOUNTER — Ambulatory Visit (INDEPENDENT_AMBULATORY_CARE_PROVIDER_SITE_OTHER): Payer: BC Managed Care – PPO | Admitting: Nurse Practitioner

## 2022-03-08 VITALS — BP 120/70 | HR 85 | Temp 98.2°F | Ht 61.0 in | Wt 219.6 lb

## 2022-03-08 DIAGNOSIS — Z6841 Body Mass Index (BMI) 40.0 and over, adult: Secondary | ICD-10-CM

## 2022-03-08 DIAGNOSIS — G4733 Obstructive sleep apnea (adult) (pediatric): Secondary | ICD-10-CM

## 2022-03-08 NOTE — Progress Notes (Signed)
@Patient  ID: , female    DOB: [birth year], 66 y.o.   MRN: [id number]  Chief Complaint  Patient presents with   Follow-up    She is here to go over CPAP, she is not sleeping well.     Referring provider: [id number], MD  HPI: 66 year old female, never smoker followed for OSA. She is a patient of Dr. 77 and last seen in office on 10/24/2021. Past medical history significant for HTN, GERD, allergic rhinitis, pancreatic insufficiency.   TEST/EVENTS:  02/15/2022 HST: AHI 45.9, SpO2 low 80% Severe OSA  10/24/2021: OV with Dr. 10/26/2021. Seen 2 years ago but unable to complete sleep study at the time. Has issues with sleep and snoring. Sleep nonrestorative. Weight stable. Concerned for OSA - HST ordered.   03/08/2022: Today - follow up Patient presents today with husband to discuss sleep study results which showed severe obstructive sleep apnea. She continues to have daytime fatigue symptoms and feels like her sleep is not restful. Wakes in the morning feeling groggy and with a dry mouth. She denies any sleep parasomnias, morning headaches, or drowsy driving. She is hoping to get started on CPAP therapy.   Allergies  Allergen Reactions   Tylenol [Acetaminophen] Other (See Comments)    Makes her feel jumpy.    Immunization History  Administered Date(s) Administered   PFIZER(Purple Top)SARS-COV-2 Vaccination 11/25/2019, 05/05/2020   Tdap 06/28/2016, 08/14/2016   Zoster, Live 06/22/2011    Past Medical History:  Diagnosis Date   GERD (gastroesophageal reflux disease)    Heart disorder    Heart Muscle Spasms   Hypertension    Pancreatic insufficiency     Tobacco History: Social History   Tobacco Use  Smoking Status Never  Smokeless Tobacco Never   Counseling given: Not Answered   Outpatient Medications Prior to Visit  Medication Sig Dispense Refill   amLODipine (NORVASC) 5 MG tablet Take 5 mg by mouth daily before breakfast.      aspirin EC 81 MG  tablet Take 81 mg by mouth daily.     Azelastine HCl 137 MCG/SPRAY SOLN Place 2 sprays into both nostrils 2 (two) times daily.     carvedilol (COREG) 3.125 MG tablet Take 1 tablet (3.125 mg total) by mouth 2 (two) times daily with a meal. 60 tablet 3   diphenhydrAMINE (BENADRYL) 50 MG tablet Take 1 tablet every 4-6 hours as needed for swelling. 30 tablet 0   famotidine (PEPCID) 10 MG tablet Take 10 mg by mouth 2 (two) times daily.     levothyroxine (SYNTHROID, LEVOTHROID) 50 MCG tablet Take 50 mcg by mouth daily before breakfast.     losartan (COZAAR) 100 MG tablet Take 100 mg by mouth daily before breakfast.      meclizine (ANTIVERT) 25 MG tablet Take 25 mg by mouth 2 (two) times daily as needed.     Multiple Vitamins-Minerals (MULTIVITAMIN WITH MINERALS) tablet Take 1 tablet by mouth daily.     ondansetron (ZOFRAN-ODT) 4 MG disintegrating tablet Take 4 mg by mouth 2 (two) times daily as needed.     Pancrelipase, Lip-Prot-Amyl, 24000-76000 units CPEP Take 1-3 capsules by mouth See admin instructions. Takes 2-3 capsules with meals and 1 capsule with snacks.     azelastine (ASTELIN) 0.1 % nasal spray Place into the nose.     omeprazole (PRILOSEC) 10 MG capsule Take 10 mg by mouth daily.     fluticasone (FLONASE) 50 MCG/ACT nasal spray Place into both nostrils. (Patient  not taking: Reported on 03/08/2022)     ipratropium (ATROVENT) 0.03 % nasal spray Place into both nostrils. (Patient not taking: Reported on 03/08/2022)     ranitidine (ZANTAC) 150 MG tablet Take 1 tablet (150 mg total) by mouth 2 (two) times daily. (Patient not taking: Reported on 03/08/2022) 30 tablet 0   No facility-administered medications prior to visit.     Review of Systems:   Constitutional: No weight loss or gain, night sweats, fevers, chills, or lassitude. +excessive daytime fatigue HEENT: No headaches, difficulty swallowing, tooth/dental problems, or sore throat. No sneezing, itching, ear ache, nasal congestion, or post  nasal drip. +AM mouth dryness CV:  No chest pain, orthopnea, PND, swelling in lower extremities, anasarca, dizziness, palpitations, syncope Resp: +snoring. No shortness of breath with exertion or at rest. No excess mucus or change in color of mucus. No productive or non-productive. No hemoptysis. No wheezing.  No chest wall deformity Skin: No rash, lesions, ulcerations MSK:  No joint pain or swelling.  No decreased range of motion.  No back pain. Neuro: No dizziness or lightheadedness.  Psych: No depression or anxiety. Mood stable.     Physical Exam:  BP 120/70 (BP Location: Left Arm, Patient Position: Sitting, Cuff Size: Normal)   Pulse 85   Temp 98.2 F (36.8 C) (Oral)   Ht 5\' 1"  (1.549 m)   Wt 219 lb 9.6 oz (99.6 kg)   SpO2 95%   BMI 41.49 kg/m   GEN: Pleasant, interactive, well-appearing; obese; in no acute distress. HEENT:  Normocephalic and atraumatic. PERRLA. Sclera white. Nasal turbinates pink, moist and patent bilaterally. No rhinorrhea present. Oropharynx pink and moist, without exudate or edema. No lesions, ulcerations, or postnasal drip.  NECK:  Supple w/ fair ROM. No JVD present. Normal carotid impulses w/o bruits. Thyroid symmetrical with no goiter or nodules palpated. No lymphadenopathy.   CV: RRR, no m/r/g, no peripheral edema. Pulses intact, +2 bilaterally. No cyanosis, pallor or clubbing. PULMONARY:  Unlabored, regular breathing. Clear bilaterally A&P w/o wheezes/rales/rhonchi. No accessory muscle use. No dullness to percussion. GI: BS present and normoactive. Soft, non-tender to palpation. No organomegaly or masses detected. No CVA tenderness. MSK: No erythema, warmth or tenderness. Cap refil <2 sec all extrem. No deformities or joint swelling noted.  Neuro: A/Ox3. No focal deficits noted.   Skin: Warm, no lesions or rashe Psych: Normal affect and behavior. Judgement and thought content appropriate.     Lab Results:  CBC    Component Value Date/Time   WBC  7.3 10/04/2021 1636   RBC 5.07 10/04/2021 1636   HGB 13.0 10/04/2021 1636   HCT 41.4 10/04/2021 1636   PLT 258 10/04/2021 1636   MCV 81.7 10/04/2021 1636   MCH 25.6 (L) 10/04/2021 1636   MCHC 31.4 10/04/2021 1636   RDW 14.6 10/04/2021 1636   LYMPHSABS 1.8 10/04/2021 1636   MONOABS 0.5 10/04/2021 1636   EOSABS 0.1 10/04/2021 1636   BASOSABS 0.1 10/04/2021 1636    BMET    Component Value Date/Time   NA 140 10/04/2021 1636   K 3.6 10/04/2021 1636   CL 106 10/04/2021 1636   CO2 27 10/04/2021 1636   GLUCOSE 145 (H) 10/04/2021 1636   BUN 19 10/04/2021 1636   CREATININE 0.74 10/04/2021 1636   CREATININE 0.73 10/21/2011 1238   CALCIUM 9.1 10/04/2021 1636   GFRNONAA >60 10/04/2021 1636   GFRAA >60 05/08/2020 0625    BNP    Component Value Date/Time   BNP 26.0  12/09/2019 1322     Imaging:  No results found.        No data to display          No results found for: "NITRICOXIDE"      Assessment & Plan:   Severe obstructive sleep apnea Severe OSA with AHI 45.9. We discussed how untreated sleep apnea puts an individual at risk for cardiac arrhthymias, pulm HTN, DM, stroke and increases their risk for daytime accidents. We also briefly reviewed treatment options including weight loss, side sleeping position, oral appliance, CPAP therapy or referral to ENT for possible surgical options. She would like to move forward with CPAP therapy. Start auto CPAP 5-20, mask of choice and heated humidification - new orders sent today.   Patient Instructions  Start to use CPAP auto 5-20 cmH2O every night, minimum of 4-6 hours a night.  Change equipment every 30 days or as directed by DME. Wash your tubing with warm soap and water daily, hang to dry. Wash humidifier portion weekly.  Be aware of reduced alertness and do not drive or operate heavy machinery if experiencing this or drowsiness.  Exercise encouraged, as tolerated. Avoid or decrease alcohol consumption and medications  that make you more sleepy, if possible. Notify if persistent daytime sleepiness occurs even with consistent use of CPAP.  Follow up in 31-90 days with Dr. Wynona Neat or Philis Nettle. If symptoms do not improve or worsen, please contact office for sooner follow up or seek emergency care.     Morbid obesity with BMI of 40.0-44.9, adult (HCC) Healthy weight management advised. Exercise 150 min/week recommended.   I spent 32 minutes of dedicated to the care of this patient on the date of this encounter to include pre-visit review of records, face-to-face time with the patient discussing conditions above, post visit ordering of testing, clinical documentation with the electronic health record, making appropriate referrals as documented, and communicating necessary findings to members of the patients care team.  Noemi Chapel, NP 03/08/2022  Pt aware and understands NP's role.

## 2022-03-08 NOTE — Assessment & Plan Note (Addendum)
Healthy weight management advised. Exercise 150 min/week recommended.

## 2022-03-08 NOTE — Assessment & Plan Note (Signed)
Severe OSA with AHI 45.9. We discussed how untreated sleep apnea puts an individual at risk for cardiac arrhthymias, pulm HTN, DM, stroke and increases their risk for daytime accidents. We also briefly reviewed treatment options including weight loss, side sleeping position, oral appliance, CPAP therapy or referral to ENT for possible surgical options. She would like to move forward with CPAP therapy. Start auto CPAP 5-20, mask of choice and heated humidification - new orders sent today.   Patient Instructions  Start to use CPAP auto 5-20 cmH2O every night, minimum of 4-6 hours a night.  Change equipment every 30 days or as directed by DME. Wash your tubing with warm soap and water daily, hang to dry. Wash humidifier portion weekly.  Be aware of reduced alertness and do not drive or operate heavy machinery if experiencing this or drowsiness.  Exercise encouraged, as tolerated. Avoid or decrease alcohol consumption and medications that make you more sleepy, if possible. Notify if persistent daytime sleepiness occurs even with consistent use of CPAP.  Follow up in 31-90 days with Dr. Wynona Neat or Philis Nettle. If symptoms do not improve or worsen, please contact office for sooner follow up or seek emergency care.

## 2022-03-08 NOTE — Patient Instructions (Addendum)
Start to use CPAP auto 5-20 cmH2O every night, minimum of 4-6 hours a night.  Change equipment every 30 days or as directed by DME. Wash your tubing with warm soap and water daily, hang to dry. Wash humidifier portion weekly.  Be aware of reduced alertness and do not drive or operate heavy machinery if experiencing this or drowsiness.  Exercise encouraged, as tolerated. Avoid or decrease alcohol consumption and medications that make you more sleepy, if possible. Notify if persistent daytime sleepiness occurs even with consistent use of CPAP.  Follow up in 31-90 days with Dr. Wynona Neat or Philis Nettle. If symptoms do not improve or worsen, please contact office for sooner follow up or seek emergency care.

## 2022-03-14 DIAGNOSIS — Z23 Encounter for immunization: Secondary | ICD-10-CM | POA: Diagnosis not present

## 2022-03-22 ENCOUNTER — Telehealth: Payer: Self-pay | Admitting: Pulmonary Disease

## 2022-03-22 NOTE — Telephone Encounter (Signed)
I called the patient and left a message to call back as she may not need the follow up with Dr. Val Eagle on 04/04/22.

## 2022-03-23 DIAGNOSIS — G4733 Obstructive sleep apnea (adult) (pediatric): Secondary | ICD-10-CM | POA: Diagnosis not present

## 2022-03-29 ENCOUNTER — Ambulatory Visit (INDEPENDENT_AMBULATORY_CARE_PROVIDER_SITE_OTHER): Payer: BC Managed Care – PPO | Admitting: Nurse Practitioner

## 2022-03-29 ENCOUNTER — Encounter: Payer: Self-pay | Admitting: Nurse Practitioner

## 2022-03-29 VITALS — BP 120/68 | HR 106 | Temp 98.3°F | Ht 61.0 in | Wt 217.0 lb

## 2022-03-29 DIAGNOSIS — Z6841 Body Mass Index (BMI) 40.0 and over, adult: Secondary | ICD-10-CM | POA: Diagnosis not present

## 2022-03-29 DIAGNOSIS — F5102 Adjustment insomnia: Secondary | ICD-10-CM

## 2022-03-29 DIAGNOSIS — G4733 Obstructive sleep apnea (adult) (pediatric): Secondary | ICD-10-CM

## 2022-03-29 MED ORDER — TRAZODONE HCL 50 MG PO TABS: 25.0000 mg | ORAL_TABLET | Freq: Every evening | ORAL | 1 refills | Status: DC | PRN

## 2022-03-29 NOTE — Assessment & Plan Note (Signed)
Healthy weight management discussed.

## 2022-03-29 NOTE — Progress Notes (Signed)
@Patient  ID: , female    DOB: February 06, 1956, 66 y.o.   MRN: [id number]  Chief Complaint  Patient presents with   Follow-up    CPAP-too tight with mask? Pressure feels ok    Referring provider: [id number], MD  HPI: 66 year old female, never smoker followed for OSA. She is a patient of Dr. 77 and last seen in office on 03/08/2022 by [facility] NP. Past medical history significant for HTN, GERD, allergic rhinitis, pancreatic insufficiency.   TEST/EVENTS:  02/15/2022 HST: AHI 45.9, SpO2 low 80% Severe OSA  10/24/2021: OV with Dr. 10/26/2021. Seen 2 years ago but unable to complete sleep study at the time. Has issues with sleep and snoring. Sleep nonrestorative. Weight stable. Concerned for OSA - HST ordered.   03/08/2022: OV with Jarek Longton NP to discuss sleep study results which showed severe obstructive sleep apnea. She continues to have daytime fatigue symptoms and feels like her sleep is not restful. Wakes in the morning feeling groggy and with a dry mouth. She denies any sleep parasomnias, morning headaches, or drowsy driving. She is hoping to get started on CPAP therapy - order sent to DME for new start auto CPAP 5-20 cmH2O.   03/29/2022: Today - acute Patient presents today to discuss issues with her CPAP/sleep. She received her mask last week. She is having some trouble adjusting to wearing her CPAP. Feels like she gets distracted by the mask and her mind is all over the place as she's trying to fall asleep. She has started turning on the TV for a few minutes after she puts her mask on, which has helped some. She has only worn it three nights, one of which was for a little over 4 hours. She did notice a difference in how well she slept and how she felt more rested after that night versus others. She also is concerned that her mask may be too tight. She denies any morning headaches, sleep parasomnias, or drowsy driving. She doesn't feel like she's having any significant mask leaks.    02/26/2022-03/27/2022 [facility] Download CPAP 5-20 3/30 days; 0% >4 hr; av usage 1 hr 24 min Pressure median 3.6, 95th 6.5 Leaks 95th 0 AHI 0.5  Allergies  Allergen Reactions   Tylenol [Acetaminophen] Other (See Comments)    Makes her feel jumpy.    Immunization History  Administered Date(s) Administered   PFIZER(Purple Top)SARS-COV-2 Vaccination 11/25/2019, 05/05/2020   Tdap 06/28/2016, 08/14/2016   Zoster, Live 06/22/2011    Past Medical History:  Diagnosis Date   GERD (gastroesophageal reflux disease)    Heart disorder    Heart Muscle Spasms   Hypertension    Pancreatic insufficiency     Tobacco History: Social History   Tobacco Use  Smoking Status Never  Smokeless Tobacco Never   Counseling given: Not Answered   Outpatient Medications Prior to Visit  Medication Sig Dispense Refill   amLODipine (NORVASC) 5 MG tablet Take 5 mg by mouth daily before breakfast.      aspirin EC 81 MG tablet Take 81 mg by mouth daily.     Azelastine HCl 137 MCG/SPRAY SOLN Place 2 sprays into both nostrils 2 (two) times daily.     carvedilol (COREG) 3.125 MG tablet Take 1 tablet (3.125 mg total) by mouth 2 (two) times daily with a meal. 60 tablet 3   levothyroxine (SYNTHROID, LEVOTHROID) 50 MCG tablet Take 50 mcg by mouth daily before breakfast.     losartan (COZAAR) 100 MG tablet Take  100 mg by mouth daily before breakfast.      Multiple Vitamins-Minerals (MULTIVITAMIN WITH MINERALS) tablet Take 1 tablet by mouth daily.     Pancrelipase, Lip-Prot-Amyl, 24000-76000 units CPEP Take 1-3 capsules by mouth See admin instructions. Takes 2-3 capsules with meals and 1 capsule with snacks.     diphenhydrAMINE (BENADRYL) 50 MG tablet Take 1 tablet every 4-6 hours as needed for swelling. (Patient not taking: Reported on 03/29/2022) 30 tablet 0   famotidine (PEPCID) 10 MG tablet Take 10 mg by mouth 2 (two) times daily. (Patient not taking: Reported on 03/29/2022)     meclizine (ANTIVERT) 25 MG  tablet Take 25 mg by mouth 2 (two) times daily as needed. (Patient not taking: Reported on 03/29/2022)     ondansetron (ZOFRAN-ODT) 4 MG disintegrating tablet Take 4 mg by mouth 2 (two) times daily as needed. (Patient not taking: Reported on 03/29/2022)     No facility-administered medications prior to visit.     Review of Systems:   Constitutional: No weight loss or gain, night sweats, fevers, chills, or lassitude. +excessive daytime fatigue HEENT: No headaches, difficulty swallowing, tooth/dental problems, or sore throat. No sneezing, itching, ear ache, nasal congestion, or post nasal drip. +AM mouth dryness CV:  No chest pain, orthopnea, PND, swelling in lower extremities, anasarca, dizziness, palpitations, syncope Resp: +snoring. No shortness of breath with exertion or at rest. No excess mucus or change in color of mucus. No productive or non-productive. No hemoptysis. No wheezing.  No chest wall deformity Skin: No rash, lesions, ulcerations MSK:  No joint pain or swelling.  No decreased range of motion.  No back pain. Neuro: No dizziness or lightheadedness.  Psych: No depression or anxiety. Mood stable.     Physical Exam:  BP 120/68 (BP Location: Right Arm, Cuff Size: Large)   Pulse (!) 106   Temp 98.3 F (36.8 C) (Temporal)   Ht 5\' 1"  (1.549 m)   Wt 217 lb (98.4 kg)   SpO2 97%   BMI 41.00 kg/m   GEN: Pleasant, interactive, well-appearing; obese; in no acute distress. HEENT:  Normocephalic and atraumatic. PERRLA. Sclera white. Nasal turbinates pink, moist and patent bilaterally. No rhinorrhea present. Oropharynx pink and moist, without exudate or edema. No lesions, ulcerations, or postnasal drip.  NECK:  Supple w/ fair ROM. No JVD present. Normal carotid impulses w/o bruits. Thyroid symmetrical with no goiter or nodules palpated. No lymphadenopathy.   CV: RRR, no m/r/g, no peripheral edema. Pulses intact, +2 bilaterally. No cyanosis, pallor or clubbing. PULMONARY:  Unlabored,  regular breathing. Clear bilaterally A&P w/o wheezes/rales/rhonchi. No accessory muscle use. No dullness to percussion. GI: BS present and normoactive. Soft, non-tender to palpation. No organomegaly or masses detected. No CVA tenderness. MSK: No erythema, warmth or tenderness. Cap refil <2 sec all extrem. No deformities or joint swelling noted.  Neuro: A/Ox3. No focal deficits noted.   Skin: Warm, no lesions or rashe Psych: Normal affect and behavior. Judgement and thought content appropriate.     Lab Results:  CBC    Component Value Date/Time   WBC 7.3 10/04/2021 1636   RBC 5.07 10/04/2021 1636   HGB 13.0 10/04/2021 1636   HCT 41.4 10/04/2021 1636   PLT 258 10/04/2021 1636   MCV 81.7 10/04/2021 1636   MCH 25.6 (L) 10/04/2021 1636   MCHC 31.4 10/04/2021 1636   RDW 14.6 10/04/2021 1636   LYMPHSABS 1.8 10/04/2021 1636   MONOABS 0.5 10/04/2021 1636   EOSABS 0.1 10/04/2021 1636  BASOSABS 0.1 10/04/2021 1636    BMET    Component Value Date/Time   NA 140 10/04/2021 1636   K 3.6 10/04/2021 1636   CL 106 10/04/2021 1636   CO2 27 10/04/2021 1636   GLUCOSE 145 (H) 10/04/2021 1636   BUN 19 10/04/2021 1636   CREATININE 0.74 10/04/2021 1636   CREATININE 0.73 10/21/2011 1238   CALCIUM 9.1 10/04/2021 1636   GFRNONAA >60 10/04/2021 1636   GFRAA >60 05/08/2020 0625    BNP    Component Value Date/Time   BNP 26.0 12/09/2019 1322     Imaging:  No results found.        No data to display          No results found for: "NITRICOXIDE"      Assessment & Plan:   Severe obstructive sleep apnea Difficulties adjusting to CPAP. She has noticed good benefit when she has used it for longer duration. We discussed how untreated sleep apnea puts an individual at risk for cardiac arrhthymias, pulm HTN, DM, stroke and increases their risk for daytime accidents. Understands the importance of use. Encouraged increase usage to minimum of 4-6 hours a night. She is going to follow up  with [facility] to see if they can make any mask adjustments today. I am also going to prescribe her low dose trazodone for adjustment insomnia. Educated on the importance of applying her mask after she takes the medication to reduce risk of falling asleep without it and worsening OSA. We will reassess compliance in 6 weeks.   Patient Instructions  Increase use of CPAP auto 5-20 cmH2O every night, minimum of 4-6 hours a night.  Change equipment every 30 days or as directed by DME. Wash your tubing with warm soap and water daily, hang to dry. Wash humidifier portion weekly.  Be aware of reduced alertness and do not drive or operate heavy machinery if experiencing this or drowsiness.  Exercise encouraged, as tolerated. Avoid or decrease alcohol consumption and medications that make you more sleepy, if possible. Notify if persistent daytime sleepiness occurs even with consistent use of CPAP.  Contact your medical supply company to see about adjusting your mask  Trazodone 25-50 mg (1/2 tab to 1 tab) At bedtime as needed for sleep. Take 30 minutes prior to bedtime. Put your CPAP on within 15 minutes after taking to ensure you don't fall asleep without it on. Do not drive after taking.   Follow up in 6 weeks with Dr. Ander Slade or Alanson Aly. If symptoms do not improve or worsen, please contact office for sooner follow up or seek emergency care.     Morbid obesity with BMI of 40.0-44.9, adult ([facility]) Healthy weight management discussed.    I spent 32 minutes of dedicated to the care of this patient on the date of this encounter to include pre-visit review of records, face-to-face time with the patient discussing conditions above, post visit ordering of testing, clinical documentation with the electronic health record, making appropriate referrals as documented, and communicating necessary findings to members of the patients care team.  Clayton Bibles, NP 03/29/2022  Pt aware and understands NP's  role.

## 2022-03-29 NOTE — Patient Instructions (Addendum)
Increase use of CPAP auto 5-20 cmH2O every night, minimum of 4-6 hours a night.  Change equipment every 30 days or as directed by DME. Wash your tubing with warm soap and water daily, hang to dry. Wash humidifier portion weekly.  Be aware of reduced alertness and do not drive or operate heavy machinery if experiencing this or drowsiness.  Exercise encouraged, as tolerated. Avoid or decrease alcohol consumption and medications that make you more sleepy, if possible. Notify if persistent daytime sleepiness occurs even with consistent use of CPAP.  Contact your medical supply company to see about adjusting your mask  Trazodone 25-50 mg (1/2 tab to 1 tab) At bedtime as needed for sleep. Take 30 minutes prior to bedtime. Put your CPAP on within 15 minutes after taking to ensure you don't fall asleep without it on. Do not drive after taking.   Follow up in 6 weeks with Dr. Wynona Neat or Philis Nettle. If symptoms do not improve or worsen, please contact office for sooner follow up or seek emergency care.

## 2022-03-29 NOTE — Assessment & Plan Note (Addendum)
Difficulties adjusting to CPAP. She has noticed good benefit when she has used it for longer duration. We discussed how untreated sleep apnea puts an individual at risk for cardiac arrhthymias, pulm HTN, DM, stroke and increases their risk for daytime accidents. Understands the importance of use. Encouraged increase usage to minimum of 4-6 hours a night. She is going to follow up with [facility] to see if they can make any mask adjustments today. I am also going to prescribe her low dose trazodone for adjustment insomnia. Educated on the importance of applying her mask after she takes the medication to reduce risk of falling asleep without it and worsening OSA. We will reassess compliance in 6 weeks.   Patient Instructions  Increase use of CPAP auto 5-20 cmH2O every night, minimum of 4-6 hours a night.  Change equipment every 30 days or as directed by DME. Wash your tubing with warm soap and water daily, hang to dry. Wash humidifier portion weekly.  Be aware of reduced alertness and do not drive or operate heavy machinery if experiencing this or drowsiness.  Exercise encouraged, as tolerated. Avoid or decrease alcohol consumption and medications that make you more sleepy, if possible. Notify if persistent daytime sleepiness occurs even with consistent use of CPAP.  Contact your medical supply company to see about adjusting your mask  Trazodone 25-50 mg (1/2 tab to 1 tab) At bedtime as needed for sleep. Take 30 minutes prior to bedtime. Put your CPAP on within 15 minutes after taking to ensure you don't fall asleep without it on. Do not drive after taking.   Follow up in 6 weeks with Dr. Wynona Neat or Philis Nettle. If symptoms do not improve or worsen, please contact office for sooner follow up or seek emergency care.

## 2022-04-04 ENCOUNTER — Ambulatory Visit: Payer: BC Managed Care – PPO | Admitting: Pulmonary Disease

## 2022-04-18 ENCOUNTER — Ambulatory Visit: Payer: BC Managed Care – PPO | Admitting: Pulmonary Disease

## 2022-04-20 ENCOUNTER — Other Ambulatory Visit: Payer: Self-pay | Admitting: Nurse Practitioner

## 2022-04-20 DIAGNOSIS — F5102 Adjustment insomnia: Secondary | ICD-10-CM

## 2022-04-23 DIAGNOSIS — G4733 Obstructive sleep apnea (adult) (pediatric): Secondary | ICD-10-CM | POA: Diagnosis not present

## 2022-05-05 DIAGNOSIS — H04123 Dry eye syndrome of bilateral lacrimal glands: Secondary | ICD-10-CM | POA: Diagnosis not present

## 2022-05-05 DIAGNOSIS — H00022 Hordeolum internum right lower eyelid: Secondary | ICD-10-CM | POA: Diagnosis not present

## 2022-05-05 DIAGNOSIS — H43813 Vitreous degeneration, bilateral: Secondary | ICD-10-CM | POA: Diagnosis not present

## 2022-05-05 DIAGNOSIS — H26493 Other secondary cataract, bilateral: Secondary | ICD-10-CM | POA: Diagnosis not present

## 2022-05-10 ENCOUNTER — Ambulatory Visit: Payer: BC Managed Care – PPO | Admitting: Nurse Practitioner

## 2022-05-23 DIAGNOSIS — G4733 Obstructive sleep apnea (adult) (pediatric): Secondary | ICD-10-CM | POA: Diagnosis not present

## 2022-05-31 ENCOUNTER — Ambulatory Visit (INDEPENDENT_AMBULATORY_CARE_PROVIDER_SITE_OTHER): Payer: BC Managed Care – PPO | Admitting: Nurse Practitioner

## 2022-05-31 ENCOUNTER — Encounter: Payer: Self-pay | Admitting: Nurse Practitioner

## 2022-05-31 VITALS — BP 130/80 | HR 89 | Ht 61.0 in | Wt 222.0 lb

## 2022-05-31 DIAGNOSIS — R002 Palpitations: Secondary | ICD-10-CM

## 2022-05-31 DIAGNOSIS — G4733 Obstructive sleep apnea (adult) (pediatric): Secondary | ICD-10-CM | POA: Diagnosis not present

## 2022-05-31 DIAGNOSIS — R0602 Shortness of breath: Secondary | ICD-10-CM | POA: Diagnosis not present

## 2022-05-31 MED ORDER — ALBUTEROL SULFATE HFA 108 (90 BASE) MCG/ACT IN AERS: 2.0000 | INHALATION_SPRAY | Freq: Four times a day (QID) | RESPIRATORY_TRACT | 6 refills | Status: AC | PRN

## 2022-05-31 NOTE — Assessment & Plan Note (Signed)
Severe OSA. She has done much better with her CPAP recently. She has excellent compliance with good control. She's having some occasional moderate leaks. We will tighten her pressure settings to 5-15 to see if this helps. Cautioned to be aware of drowsy driving.   Patient Instructions  Continue CPAP auto 5-20 cmH2O every night, minimum of 4-6 hours a night.  Change equipment every 30 days or as directed by DME. Wash your tubing with warm soap and water daily, hang to dry. Wash humidifier portion weekly.  Be aware of reduced alertness and do not drive or operate heavy machinery if experiencing this or drowsiness.  Exercise encouraged, as tolerated. Avoid or decrease alcohol consumption and medications that make you more sleepy, if possible. Notify if persistent daytime sleepiness occurs even with consistent use of CPAP.  Adjust CPAP to 5-15 cmH2O    Continue Trazodone 25-50 mg (1/2 tab to 1 tab) At bedtime as needed for sleep. Take 30 minutes prior to bedtime. Put your CPAP on within 15 minutes after taking to ensure you don't fall asleep without it on. Do not drive after taking.  Trial Albuterol inhaler 2 puffs or 3 mL neb every 6 hours as needed for shortness of breath or wheezing. Notify if symptoms persist despite rescue inhaler/neb use.   Pulmonary function testing ordered today    Follow up after PFTs with Dr. Ander Slade or Alanson Aly. If symptoms do not improve or worsen, please contact office for sooner follow up or seek emergency care.

## 2022-05-31 NOTE — Progress Notes (Signed)
@Patient  ID: , female    DOB: 08/15/1955, 66 y.o.   MRN: [id number]  Chief Complaint  Patient presents with   Follow-up    CPAP    Referring provider: [id number], MD  HPI: 66 year old female, never smoker followed for OSA. She is a patient of Dr. 77 and last seen in office on 03/08/2022 by [facility] NP. Past medical history significant for HTN, GERD, allergic rhinitis, pancreatic insufficiency.   TEST/EVENTS:  02/15/2022 HST: AHI 45.9, SpO2 low 80% Severe OSA  10/24/2021: OV with Dr. 10/26/2021. Seen 2 years ago but unable to complete sleep study at the time. Has issues with sleep and snoring. Sleep nonrestorative. Weight stable. Concerned for OSA - HST ordered.   03/08/2022: OV with Valerya Maxton NP to discuss sleep study results which showed severe obstructive sleep apnea. She continues to have daytime fatigue symptoms and feels like her sleep is not restful. Wakes in the morning feeling groggy and with a dry mouth. She denies any sleep parasomnias, morning headaches, or drowsy driving. She is hoping to get started on CPAP therapy - order sent to DME for new start auto CPAP 5-20 cmH2O.   03/29/2022: OV with Layan Zalenski NP to discuss issues with her CPAP/sleep. She received her mask last week. She is having some trouble adjusting to wearing her CPAP. Feels like she gets distracted by the mask and her mind is all over the place as she's trying to fall asleep. She has started turning on the TV for a few minutes after she puts her mask on, which has helped some. She has only worn it three nights, one of which was for a little over 4 hours. She did notice a difference in how well she slept and how she felt more rested after that night versus others. She also is concerned that her mask may be too tight. She denies any morning headaches, sleep parasomnias, or drowsy driving. She doesn't feel like she's having any significant mask leaks.   05/31/2022: Today - follow up Patient presents today for  follow up. Since I saw her last, she has been doing significantly better with her CPAP. She is wearing it nightly. Feels like she sleeps much better now. She occasionally uses the trazodone to help her fall asleep. She's not having any issues with her machine; occasionally notices a little leak. Wakes feeling better rested. Denies morning headaches or drowsy driving.  She has been struggling with some shortness of breath recently. She says that it's been going on for months to years now. She associated it more so with her weight but now is starting to get concerned. Usually only occurs with exertion but she will occasionally feel like she can't get a big deep breath in at rest. Rarely has some wheezing. She denies any cough or chest congestion. She does occasionally have associated dizziness and palpitations. She denies orthopnea, PND or chest pain. She has seen a cardiologist years ago; would like to go back. She has not pulmonary history; denies any childhood asthma. No autoimmune diseases. No significant environmental or occupational exposures. Never tried inhalers.   04/29/2022-05/28/2022 29/30 days; 97% >4 hr; av use 9 hr 11 min Pressure median 9.7, 95th 12.1 Leaks median 3.8, 16.4 AHI 0.7   Allergies  Allergen Reactions   Tylenol [Acetaminophen] Other (See Comments)    Makes her feel jumpy.    Immunization History  Administered Date(s) Administered   PFIZER(Purple Top)SARS-COV-2 Vaccination 11/25/2019, 05/05/2020   Tdap 06/28/2016, 08/14/2016  Zoster, Live 06/22/2011    Past Medical History:  Diagnosis Date   GERD (gastroesophageal reflux disease)    Heart disorder    Heart Muscle Spasms   Hypertension    Pancreatic insufficiency     Tobacco History: Social History   Tobacco Use  Smoking Status Never  Smokeless Tobacco Never   Counseling given: Not Answered   Outpatient Medications Prior to Visit  Medication Sig Dispense Refill   Azelastine HCl 137 MCG/SPRAY SOLN  Place 2 sprays into both nostrils 2 (two) times daily.     famotidine (PEPCID) 10 MG tablet Take 10 mg by mouth 2 (two) times daily.     amLODipine (NORVASC) 5 MG tablet Take 5 mg by mouth daily before breakfast.      aspirin EC 81 MG tablet Take 81 mg by mouth daily.     carvedilol (COREG) 3.125 MG tablet Take 1 tablet (3.125 mg total) by mouth 2 (two) times daily with a meal. 60 tablet 3   diphenhydrAMINE (BENADRYL) 50 MG tablet Take 1 tablet every 4-6 hours as needed for swelling. (Patient not taking: Reported on 03/29/2022) 30 tablet 0   levothyroxine (SYNTHROID, LEVOTHROID) 50 MCG tablet Take 50 mcg by mouth daily before breakfast.     losartan (COZAAR) 100 MG tablet Take 100 mg by mouth daily before breakfast.      meclizine (ANTIVERT) 25 MG tablet Take 25 mg by mouth 2 (two) times daily as needed. (Patient not taking: Reported on 03/29/2022)     Multiple Vitamins-Minerals (MULTIVITAMIN WITH MINERALS) tablet Take 1 tablet by mouth daily.     ondansetron (ZOFRAN-ODT) 4 MG disintegrating tablet Take 4 mg by mouth 2 (two) times daily as needed. (Patient not taking: Reported on 03/29/2022)     Pancrelipase, Lip-Prot-Amyl, 24000-76000 units CPEP Take 1-3 capsules by mouth See admin instructions. Takes 2-3 capsules with meals and 1 capsule with snacks.     traZODone (DESYREL) 50 MG tablet TAKE 1/2 TABLET THE FIRST NIGHT. CAN INCREASE TO 1 TAB ON SUBSEQUENT NIGHTS IF NEEDED FOR SLEEP. 90 tablet 1   No facility-administered medications prior to visit.     Review of Systems:   Constitutional: No weight loss or gain, night sweats, fevers, chills, or lassitude. +fatigue (improving) HEENT: No headaches, difficulty swallowing, tooth/dental problems, or sore throat. No sneezing, itching, ear ache, nasal congestion, or post nasal drip.  CV:  +dizziness, palpitations, mild BLE swelling. No chest pain, orthopnea, PND, anasarca, syncope Resp: +shortness of breath with exertion; wheezing. No excess mucus or  change in color of mucus. No productive or non-productive. No hemoptysis. No chest wall deformity Skin: No rash, lesions, ulcerations MSK:  No joint pain or swelling.  No decreased range of motion.  No back pain. Neuro: No gait abnormalities, weakness, numbness/tingling  Psych: No depression or anxiety. Mood stable.     Physical Exam:  BP 130/80 (BP Location: Right Arm)   Pulse 89   Ht 5\' 1"  (1.549 m)   Wt 222 lb (100.7 kg)   SpO2 95%   BMI 41.95 kg/m   GEN: Pleasant, interactive, well-appearing; obese; in no acute distress. HEENT:  Normocephalic and atraumatic. PERRLA. Sclera white. Nasal turbinates pink, moist and patent bilaterally. No rhinorrhea present. Oropharynx pink and moist, without exudate or edema. No lesions, ulcerations, or postnasal drip.  NECK:  Supple w/ fair ROM. No JVD present. Normal carotid impulses w/o bruits. Thyroid symmetrical with no goiter or nodules palpated. No lymphadenopathy.   CV: RRR, no  m/r/g, no peripheral edema. Pulses intact, +2 bilaterally. No cyanosis, pallor or clubbing. PULMONARY:  Unlabored, regular breathing. Clear bilaterally A&P w/o wheezes/rales/rhonchi. No accessory muscle use. No dullness to percussion. GI: BS present and normoactive. Soft, non-tender to palpation. No organomegaly or masses detected.  MSK: No erythema, warmth or tenderness. No deformities or joint swelling noted.  Neuro: A/Ox3. No focal deficits noted.   Skin: Warm, no lesions or rashe Psych: Normal affect and behavior. Judgement and thought content appropriate.     Lab Results:  CBC    Component Value Date/Time   WBC 7.3 10/04/2021 1636   RBC 5.07 10/04/2021 1636   HGB 13.0 10/04/2021 1636   HCT 41.4 10/04/2021 1636   PLT 258 10/04/2021 1636   MCV 81.7 10/04/2021 1636   MCH 25.6 (L) 10/04/2021 1636   MCHC 31.4 10/04/2021 1636   RDW 14.6 10/04/2021 1636   LYMPHSABS 1.8 10/04/2021 1636   MONOABS 0.5 10/04/2021 1636   EOSABS 0.1 10/04/2021 1636   BASOSABS  0.1 10/04/2021 1636    BMET    Component Value Date/Time   NA 140 10/04/2021 1636   K 3.6 10/04/2021 1636   CL 106 10/04/2021 1636   CO2 27 10/04/2021 1636   GLUCOSE 145 (H) 10/04/2021 1636   BUN 19 10/04/2021 1636   CREATININE 0.74 10/04/2021 1636   CREATININE 0.73 10/21/2011 1238   CALCIUM 9.1 10/04/2021 1636   GFRNONAA >60 10/04/2021 1636   GFRAA >60 05/08/2020 0625    BNP    Component Value Date/Time   BNP 26.0 12/09/2019 1322     Imaging:  No results found.        No data to display          No results found for: "NITRICOXIDE"      Assessment & Plan:   Severe obstructive sleep apnea Severe OSA. She has done much better with her CPAP recently. She has excellent compliance with good control. She's having some occasional moderate leaks. We will tighten her pressure settings to 5-15 to see if this helps. Cautioned to be aware of drowsy driving.   Patient Instructions  Continue CPAP auto 5-20 cmH2O every night, minimum of 4-6 hours a night.  Change equipment every 30 days or as directed by DME. Wash your tubing with warm soap and water daily, hang to dry. Wash humidifier portion weekly.  Be aware of reduced alertness and do not drive or operate heavy machinery if experiencing this or drowsiness.  Exercise encouraged, as tolerated. Avoid or decrease alcohol consumption and medications that make you more sleepy, if possible. Notify if persistent daytime sleepiness occurs even with consistent use of CPAP.  Adjust CPAP to 5-15 cmH2O    Continue Trazodone 25-50 mg (1/2 tab to 1 tab) At bedtime as needed for sleep. Take 30 minutes prior to bedtime. Put your CPAP on within 15 minutes after taking to ensure you don't fall asleep without it on. Do not drive after taking.  Trial Albuterol inhaler 2 puffs or 3 mL neb every 6 hours as needed for shortness of breath or wheezing. Notify if symptoms persist despite rescue inhaler/neb use.   Pulmonary function testing  ordered today    Follow up after PFTs with Dr. Ander Slade or Alanson Aly. If symptoms do not improve or worsen, please contact office for sooner follow up or seek emergency care.     Shortness of breath Worsening dyspnea with associated palpitations and dizziness. She does have some occasional wheezing. Unclear etiology  at this point; discussed vast options at this could be ENT, cardiac, pulmonary, MSK, deconditioning. We will obtain PFTs for further evaluation. Trial SABA in interim to see if she receives any benefit. Healthy weight loss measures encouraged. She did have untreated severe OSA for a long period; possible there is a component of PAH? No significant volume overload on exam though. I am concerned for possible underlying cardiac component given her constellation of symptoms. Recommend referral to cardiology for further evaluation. We also discussed that this could be     I spent 38 minutes of dedicated to the care of this patient on the date of this encounter to include pre-visit review of records, face-to-face time with the patient discussing conditions above, post visit ordering of testing, clinical documentation with the electronic health record, making appropriate referrals as documented, and communicating necessary findings to members of the patients care team.  Noemi Chapel, NP 05/31/2022  Pt aware and understands NP's role.

## 2022-05-31 NOTE — Patient Instructions (Addendum)
Continue CPAP auto 5-20 cmH2O every night, minimum of 4-6 hours a night.  Change equipment every 30 days or as directed by DME. Wash your tubing with warm soap and water daily, hang to dry. Wash humidifier portion weekly.  Be aware of reduced alertness and do not drive or operate heavy machinery if experiencing this or drowsiness.  Exercise encouraged, as tolerated. Avoid or decrease alcohol consumption and medications that make you more sleepy, if possible. Notify if persistent daytime sleepiness occurs even with consistent use of CPAP.  Adjust CPAP to 5-15 cmH2O    Continue Trazodone 25-50 mg (1/2 tab to 1 tab) At bedtime as needed for sleep. Take 30 minutes prior to bedtime. Put your CPAP on within 15 minutes after taking to ensure you don't fall asleep without it on. Do not drive after taking.  Trial Albuterol inhaler 2 puffs or 3 mL neb every 6 hours as needed for shortness of breath or wheezing. Notify if symptoms persist despite rescue inhaler/neb use.   Pulmonary function testing ordered today    Follow up after PFTs with Dr. Ander Slade or Alanson Aly. If symptoms do not improve or worsen, please contact office for sooner follow up or seek emergency care.

## 2022-05-31 NOTE — Assessment & Plan Note (Signed)
Worsening dyspnea with associated palpitations and dizziness. She does have some occasional wheezing. Unclear etiology at this point; discussed vast options at this could be ENT, cardiac, pulmonary, MSK, deconditioning. We will obtain PFTs for further evaluation. Trial SABA in interim to see if she receives any benefit. Healthy weight loss measures encouraged. She did have untreated severe OSA for a long period; possible there is a component of PAH? No significant volume overload on exam though. I am concerned for possible underlying cardiac component given her constellation of symptoms. Recommend referral to cardiology for further evaluation. We also discussed that this could be

## 2022-06-01 ENCOUNTER — Ambulatory Visit (INDEPENDENT_AMBULATORY_CARE_PROVIDER_SITE_OTHER): Payer: BC Managed Care – PPO | Admitting: Pulmonary Disease

## 2022-06-01 DIAGNOSIS — R0602 Shortness of breath: Secondary | ICD-10-CM | POA: Diagnosis not present

## 2022-06-01 LAB — PULMONARY FUNCTION TEST
DL/VA % pred: 103 %
DL/VA: 4.42 ml/min/mmHg/L
DLCO cor % pred: 92 %
DLCO cor: 16.56 ml/min/mmHg
DLCO unc % pred: 92 %
DLCO unc: 16.56 ml/min/mmHg
FEF 25-75 Post: 1.82 L/sec
FEF 25-75 Pre: 1.92 L/sec
FEF2575-%Change-Post: -5 %
FEF2575-%Pred-Post: 90 %
FEF2575-%Pred-Pre: 95 %
FEV1-%Change-Post: -1 %
FEV1-%Pred-Post: 82 %
FEV1-%Pred-Pre: 83 %
FEV1-Post: 1.79 L
FEV1-Pre: 1.81 L
FEV1FVC-%Change-Post: 5 %
FEV1FVC-%Pred-Pre: 105 %
FEV6-%Change-Post: -6 %
FEV6-%Pred-Post: 76 %
FEV6-%Pred-Pre: 81 %
FEV6-Post: 2.09 L
FEV6-Pre: 2.22 L
FEV6FVC-%Pred-Post: 104 %
FEV6FVC-%Pred-Pre: 104 %
FVC-%Change-Post: -5 %
FVC-%Pred-Post: 73 %
FVC-%Pred-Pre: 78 %
FVC-Post: 2.09 L
FVC-Pre: 2.22 L
Post FEV1/FVC ratio: 85 %
Post FEV6/FVC ratio: 100 %
Pre FEV1/FVC ratio: 81 %
Pre FEV6/FVC Ratio: 100 %
RV % pred: 108 %
RV: 2.08 L
TLC % pred: 94 %
TLC: 4.37 L

## 2022-06-01 NOTE — Progress Notes (Signed)
PFT done today. 

## 2022-06-05 DIAGNOSIS — H00012 Hordeolum externum right lower eyelid: Secondary | ICD-10-CM | POA: Diagnosis not present

## 2022-06-08 ENCOUNTER — Encounter: Payer: Self-pay | Admitting: Nurse Practitioner

## 2022-06-08 ENCOUNTER — Ambulatory Visit (INDEPENDENT_AMBULATORY_CARE_PROVIDER_SITE_OTHER): Payer: BC Managed Care – PPO | Admitting: Nurse Practitioner

## 2022-06-08 VITALS — BP 106/74 | HR 79 | Ht 61.0 in | Wt 220.4 lb

## 2022-06-08 DIAGNOSIS — G4733 Obstructive sleep apnea (adult) (pediatric): Secondary | ICD-10-CM

## 2022-06-08 DIAGNOSIS — Z6841 Body Mass Index (BMI) 40.0 and over, adult: Secondary | ICD-10-CM

## 2022-06-08 DIAGNOSIS — R0602 Shortness of breath: Secondary | ICD-10-CM

## 2022-06-08 NOTE — Assessment & Plan Note (Signed)
Severe OSA. She has done much better with her CPAP recently. She has excellent compliance with good control on auto 5-20 cmH2O. Some moderate leaks; previously requested her settings be tightened to 5-15 as her average pressure is 11.5cmH2O. Will resend order today.

## 2022-06-08 NOTE — Progress Notes (Signed)
@Patient  ID: , female    DOB: [birth year], 66 y.o.   MRN: [id number]  Chief Complaint  Patient presents with   Follow-up    Pt f/u to discuss pft results. Breathing is better w/ less SOB. She reports not having to use the albuterol as frequently.    Referring provider: [id number], MD  HPI: 66 year old female, never smoker followed for OSA. She is a patient of Dr. 77 and last seen in office on 05/31/2022 by [facility] NP. Past medical history significant for HTN, GERD, allergic rhinitis, pancreatic insufficiency.   TEST/EVENTS:  10/12/2019 CTA chest: no PE. No LAD. Lungs are clear.  02/15/2022 HST: AHI 45.9, SpO2 low 80% Severe OSA 06/01/2022 PFT: FVC 78, FEV1 83, ratio 85, TLC 94, DLCO 92.  No BD  10/24/2021: OV with Dr. 10/26/2021. Seen 2 years ago but unable to complete sleep study at the time. Has issues with sleep and snoring. Sleep nonrestorative. Weight stable. Concerned for OSA - HST ordered.   03/08/2022: OV with Iisha Soyars NP to discuss sleep study results which showed severe obstructive sleep apnea. She continues to have daytime fatigue symptoms and feels like her sleep is not restful. Wakes in the morning feeling groggy and with a dry mouth. She denies any sleep parasomnias, morning headaches, or drowsy driving. She is hoping to get started on CPAP therapy - order sent to DME for new start auto CPAP 5-20 cmH2O.   03/29/2022: OV with Maddyx Wieck NP to discuss issues with her CPAP/sleep. She received her mask last week. She is having some trouble adjusting to wearing her CPAP. Feels like she gets distracted by the mask and her mind is all over the place as she's trying to fall asleep. She has started turning on the TV for a few minutes after she puts her mask on, which has helped some. She has only worn it three nights, one of which was for a little over 4 hours. She did notice a difference in how well she slept and how she felt more rested after that night versus others. She also  is concerned that her mask may be too tight. She denies any morning headaches, sleep parasomnias, or drowsy driving. She doesn't feel like she's having any significant mask leaks.   05/31/2022: OV with Normagene Harvie NP for follow up. Since I saw her last, she has been doing significantly better with her CPAP. She is wearing it nightly. Feels like she sleeps much better now. She occasionally uses the trazodone to help her fall asleep. She's not having any issues with her machine; occasionally notices a little leak. Wakes feeling better rested. Denies morning headaches or drowsy driving.  Excellent compliance.  Residual AHI 0.7 She has been struggling with some shortness of breath recently. She says that it's been going on for months to years now. She associated it more so with her weight but now is starting to get concerned. Usually only occurs with exertion but she will occasionally feel like she can't get a big deep breath in at rest. Rarely has some wheezing. She denies any cough or chest congestion. She does occasionally have associated dizziness and palpitations. She denies orthopnea, PND or chest pain. She has seen a cardiologist years ago; would like to go back. She has not pulmonary history; denies any childhood asthma. No autoimmune diseases. No significant environmental or occupational exposures. Never tried inhalers.  Referred to cardiology.  PFTs ordered for further evaluation.  Provided with trial of albuterol  as needed.  06/08/2022: Today-follow-up Patient presents today for follow-up to discuss pulmonary function testing.  PFTs were overall unremarkable.  She did have a very mild restriction with FVC 78%; however, TLC remain normal.  She tells me today that she has been feeling pretty well since I saw her last.  She does feel like her shortness of breath is somewhat better and still only occurs with exertion.  She has used her albuterol a few times, feels like it provides some relief. She's only used it a  handful of times. No significant cough, chest congestion, or wheezing. Denies orthopnea, PND, syncope. She still has some occasional palpitations, usually with exertion. Going to see cardiology next week. She does want to work on weight loss measures; thinks this is the cause of her getting winded.  Wearing CPAP nightly. Feels much better using it. Fatigue has significantly improved. Denies morning headaches or drowsy driving.   1/61/0960-45/40/9811 CPAP 5-20 cmH2O 29/30 days; 100% greater than 4 hours; average usage 9 hours 13 minutes Pressure median 9.3, 95th 11.5 Leaks 95th 18.7 AHI 0.7  Allergies  Allergen Reactions   Tylenol [Acetaminophen] Other (See Comments)    Makes her feel jumpy.    Immunization History  Administered Date(s) Administered   PFIZER(Purple Top)SARS-COV-2 Vaccination 11/25/2019, 05/05/2020   Tdap 06/28/2016, 08/14/2016   Zoster, Live 06/22/2011    Past Medical History:  Diagnosis Date   GERD (gastroesophageal reflux disease)    Heart disorder    Heart Muscle Spasms   Hypertension    Pancreatic insufficiency     Tobacco History: Social History   Tobacco Use  Smoking Status Never  Smokeless Tobacco Never   Counseling given: Not Answered   Outpatient Medications Prior to Visit  Medication Sig Dispense Refill   albuterol (VENTOLIN HFA) 108 (90 Base) MCG/ACT inhaler Inhale 2 puffs into the lungs every 6 (six) hours as needed for wheezing or shortness of breath. 8 g 6   amLODipine (NORVASC) 5 MG tablet Take 5 mg by mouth daily before breakfast.      aspirin EC 81 MG tablet Take 81 mg by mouth daily.     Azelastine HCl 137 MCG/SPRAY SOLN Place 2 sprays into both nostrils 2 (two) times daily.     carvedilol (COREG) 3.125 MG tablet Take 1 tablet (3.125 mg total) by mouth 2 (two) times daily with a meal. 60 tablet 3   diphenhydrAMINE (BENADRYL) 50 MG tablet Take 1 tablet every 4-6 hours as needed for swelling. 30 tablet 0   famotidine (PEPCID) 10 MG  tablet Take 10 mg by mouth 2 (two) times daily.     levothyroxine (SYNTHROID, LEVOTHROID) 50 MCG tablet Take 50 mcg by mouth daily before breakfast.     losartan (COZAAR) 100 MG tablet Take 100 mg by mouth daily before breakfast.      meclizine (ANTIVERT) 25 MG tablet Take 25 mg by mouth 2 (two) times daily as needed.     Multiple Vitamins-Minerals (MULTIVITAMIN WITH MINERALS) tablet Take 1 tablet by mouth daily.     ondansetron (ZOFRAN-ODT) 4 MG disintegrating tablet Take 4 mg by mouth 2 (two) times daily as needed.     Pancrelipase, Lip-Prot-Amyl, 24000-76000 units CPEP Take 1-3 capsules by mouth See admin instructions. Takes 2-3 capsules with meals and 1 capsule with snacks.     traZODone (DESYREL) 50 MG tablet TAKE 1/2 TABLET THE FIRST NIGHT. CAN INCREASE TO 1 TAB ON SUBSEQUENT NIGHTS IF NEEDED FOR SLEEP. 90 tablet 1  No facility-administered medications prior to visit.     Review of Systems:   Constitutional: No weight loss or gain, night sweats, fevers, chills, or lassitude. +fatigue (improving) HEENT: No headaches, difficulty swallowing, tooth/dental problems, or sore throat. No sneezing, itching, ear ache, nasal congestion, or post nasal drip.  CV:  +dizziness, palpitations, mild BLE swelling. No chest pain, orthopnea, PND, anasarca, syncope Resp: +shortness of breath with exertion. No wheezing. No excess mucus or change in color of mucus. No productive or non-productive. No hemoptysis. No chest wall deformity Skin: No rash, lesions, ulcerations MSK:  No joint pain or swelling.  No decreased range of motion.  No back pain. Neuro: No gait abnormalities, weakness, numbness/tingling  Psych: No depression or anxiety. Mood stable.     Physical Exam:  BP 106/74   Pulse 79   Ht 5\' 1"  (1.549 m)   Wt 220 lb 6.4 oz (100 kg)   SpO2 98%   BMI 41.64 kg/m   GEN: Pleasant, interactive, well-appearing; obese; in no acute distress. HEENT:  Normocephalic and atraumatic. PERRLA. Sclera  white. Nasal turbinates pink, moist and patent bilaterally. No rhinorrhea present. Oropharynx pink and moist, without exudate or edema. No lesions, ulcerations, or postnasal drip.  NECK:  Supple w/ fair ROM. No JVD present. Normal carotid impulses w/o bruits. Thyroid symmetrical with no goiter or nodules palpated. No lymphadenopathy.   CV: RRR, no m/r/g, no peripheral edema. Pulses intact, +2 bilaterally. No cyanosis, pallor or clubbing. PULMONARY:  Unlabored, regular breathing. Clear bilaterally A&P w/o wheezes/rales/rhonchi. No accessory muscle use. No dullness to percussion. GI: BS present and normoactive. Soft, non-tender to palpation. No organomegaly or masses detected.  MSK: No erythema, warmth or tenderness. No deformities or joint swelling noted.  Neuro: A/Ox3. No focal deficits noted.   Skin: Warm, no lesions or rashe Psych: Normal affect and behavior. Judgement and thought content appropriate.     Lab Results:  CBC    Component Value Date/Time   WBC 7.3 10/04/2021 1636   RBC 5.07 10/04/2021 1636   HGB 13.0 10/04/2021 1636   HCT 41.4 10/04/2021 1636   PLT 258 10/04/2021 1636   MCV 81.7 10/04/2021 1636   MCH 25.6 (L) 10/04/2021 1636   MCHC 31.4 10/04/2021 1636   RDW 14.6 10/04/2021 1636   LYMPHSABS 1.8 10/04/2021 1636   MONOABS 0.5 10/04/2021 1636   EOSABS 0.1 10/04/2021 1636   BASOSABS 0.1 10/04/2021 1636    BMET    Component Value Date/Time   NA 140 10/04/2021 1636   K 3.6 10/04/2021 1636   CL 106 10/04/2021 1636   CO2 27 10/04/2021 1636   GLUCOSE 145 (H) 10/04/2021 1636   BUN 19 10/04/2021 1636   CREATININE 0.74 10/04/2021 1636   CREATININE 0.73 10/21/2011 1238   CALCIUM 9.1 10/04/2021 1636   GFRNONAA >60 10/04/2021 1636   GFRAA >60 05/08/2020 0625    BNP    Component Value Date/Time   BNP 26.0 12/09/2019 1322     Imaging:  No results found.       Latest Ref Rng & Units 06/01/2022    3:49 PM  PFT Results  FVC-Pre L 2.22   FVC-Predicted Pre  % 78   FVC-Post L 2.09   FVC-Predicted Post % 73   Pre FEV1/FVC % % 81   Post FEV1/FCV % % 85   FEV1-Pre L 1.81   FEV1-Predicted Pre % 83   FEV1-Post L 1.79   DLCO uncorrected ml/min/mmHg 16.56   DLCO UNC% %  92   DLCO corrected ml/min/mmHg 16.56   DLCO COR %Predicted % 92   DLVA Predicted % 103   TLC L 4.37   TLC % Predicted % 94   RV % Predicted % 108     No results found for: "NITRICOXIDE"      Assessment & Plan:   Morbid obesity with BMI of 40.0-44.9, adult (HCC) Dyspnea and mild restrictive defect likely related to obesity as DLCO is normal. She would like to work on healthy weight loss measures. Referral to medical weight management placed today. Encouraged her to increase activity level; she struggles with some right knee pain but will start going back to water aerobics.   Patient Instructions  Continue CPAP auto 5-20 cmH2O every night, minimum of 4-6 hours a night.  Be aware of reduced alertness and do not drive or operate heavy machinery if experiencing this or drowsiness.   Continue Trazodone 25-50 mg (1/2 tab to 1 tab) At bedtime as needed for sleep. Take 30 minutes prior to bedtime. Put your CPAP on within 15 minutes after taking to ensure you don't fall asleep without it on. Do not drive after taking.   Continue Albuterol inhaler 2 puffs every 6 hours as needed for shortness of breath or wheezing. Notify if symptoms persist despite rescue inhaler/neb use.    Exercise 150 min/week  Referral to medical weight management    Follow up in 6 months with Dr. Wynona Neat or Rhunette Croft NP. If symptoms worsen, please contact office for sooner follow up or seek emergency care.     Severe obstructive sleep apnea Severe OSA. She has done much better with her CPAP recently. She has excellent compliance with good control on auto 5-20 cmH2O. Some moderate leaks; previously requested her settings be tightened to 5-15 as her average pressure is 11.5cmH2O. Will resend order today.      Shortness of breath Possibly multifactorial; suspect large component related to deconditioning and obesity. Did have some mild response to albuterol; no significant BD on PFTs. Low suspicion for asthma component. She does have some associated palpitations and occasional dizziness. Likely needs cardiac monitor and echo. She will see cardiology next week so will defer to them.      I spent 38 minutes of dedicated to the care of this patient on the date of this encounter to include pre-visit review of records, face-to-face time with the patient discussing conditions above, post visit ordering of testing, clinical documentation with the electronic health record, making appropriate referrals as documented, and communicating necessary findings to members of the patients care team.  Noemi Chapel, NP 06/08/2022  Pt aware and understands NP's role.

## 2022-06-08 NOTE — Assessment & Plan Note (Signed)
Possibly multifactorial; suspect large component related to deconditioning and obesity. Did have some mild response to albuterol; no significant BD on PFTs. Low suspicion for asthma component. She does have some associated palpitations and occasional dizziness. Likely needs cardiac monitor and echo. She will see cardiology next week so will defer to them.

## 2022-06-08 NOTE — Assessment & Plan Note (Signed)
Dyspnea and mild restrictive defect likely related to obesity as DLCO is normal. She would like to work on healthy weight loss measures. Referral to medical weight management placed today. Encouraged her to increase activity level; she struggles with some right knee pain but will start going back to water aerobics.   Patient Instructions  Continue CPAP auto 5-20 cmH2O every night, minimum of 4-6 hours a night.  Be aware of reduced alertness and do not drive or operate heavy machinery if experiencing this or drowsiness.   Continue Trazodone 25-50 mg (1/2 tab to 1 tab) At bedtime as needed for sleep. Take 30 minutes prior to bedtime. Put your CPAP on within 15 minutes after taking to ensure you don't fall asleep without it on. Do not drive after taking.   Continue Albuterol inhaler 2 puffs every 6 hours as needed for shortness of breath or wheezing. Notify if symptoms persist despite rescue inhaler/neb use.    Exercise 150 min/week  Referral to medical weight management    Follow up in 6 months with Dr. Ander Slade or Roxan Diesel NP. If symptoms worsen, please contact office for sooner follow up or seek emergency care.

## 2022-06-08 NOTE — Patient Instructions (Addendum)
Continue CPAP auto 5-20 cmH2O every night, minimum of 4-6 hours a night.  Be aware of reduced alertness and do not drive or operate heavy machinery if experiencing this or drowsiness.   Continue Trazodone 25-50 mg (1/2 tab to 1 tab) At bedtime as needed for sleep. Take 30 minutes prior to bedtime. Put your CPAP on within 15 minutes after taking to ensure you don't fall asleep without it on. Do not drive after taking.   Continue Albuterol inhaler 2 puffs every 6 hours as needed for shortness of breath or wheezing. Notify if symptoms persist despite rescue inhaler/neb use.    Exercise 150 min/week  Referral to medical weight management    Follow up in 6 months with Dr. Ander Slade or Roxan Diesel NP. If symptoms worsen, please contact office for sooner follow up or seek emergency care.

## 2022-06-09 DIAGNOSIS — B353 Tinea pedis: Secondary | ICD-10-CM | POA: Diagnosis not present

## 2022-06-15 NOTE — Progress Notes (Signed)
Cardiology Office Note:    Date:  06/16/2022   ID:  [name], [name] 12/25/1955, MRN [id number]  PCP:  Donald Prose, MD  Cardiologist:  None  Electrophysiologist:  None   Referring MD: Clayton Bibles, NP   Chief Complaint  Patient presents with   Shortness of Breath    History of Present Illness:    [name] is a 66 y.o. female with a hx of hypertension, GERD, OSA who is referred by Marland Kitchen, NP for evaluation of shortness of breath.  Underwent Lexiscan Myoview 09/08/2017 which showed no reversible perfusion defect, possible basal septal fixed defect thought to represent attenuation artifact, EF 52%.  She reports she has been having shortness of breath, especially with walking uphill.  She denies any chest pain.  Reports she swims 3 times per week, has had shortness of breath while exerting herself.  Tries to do 30 minutes in the pool but has been limited recently by dyspnea.  Reports some lightheadedness but denies any syncope, attributes to vertigo.  Reports some lower extremity edema.  Reports rare palpitations.  No smoking history.  No history of heart disease in her immediate family.  LDL 68 on 01/16/2022.  Past Medical History:  Diagnosis Date   GERD (gastroesophageal reflux disease)    Heart disorder    Heart Muscle Spasms   Hypertension    Pancreatic insufficiency     Past Surgical History:  Procedure Laterality Date   CESAREAN SECTION     ESOPHAGOGASTRODUODENOSCOPY (EGD) WITH PROPOFOL N/A 01/01/2013   Procedure: ESOPHAGOGASTRODUODENOSCOPY (EGD) WITH PROPOFOL;  Surgeon: Arta Silence, MD;  Location: [facility];  Service: Endoscopy;  Laterality: N/A;   EUS N/A 01/01/2013   Procedure: ESOPHAGEAL ENDOSCOPIC ULTRASOUND (EUS) RADIAL;  Surgeon: Arta Silence, MD;  Location: [facility];  Service: Endoscopy;  Laterality: N/A;   LEFT HEART CATH AND CORONARY ANGIOGRAPHY  2010   NASAL SINUS SURGERY     TUBAL LIGATION  2001    Current  Medications: Current Meds  Medication Sig   albuterol (VENTOLIN HFA) 108 (90 Base) MCG/ACT inhaler Inhale 2 puffs into the lungs every 6 (six) hours as needed for wheezing or shortness of breath.   amLODipine (NORVASC) 5 MG tablet Take 5 mg by mouth daily before breakfast.    aspirin EC 81 MG tablet Take 81 mg by mouth daily.   Azelastine HCl 137 MCG/SPRAY SOLN Place 2 sprays into both nostrils 2 (two) times daily.   carvedilol (COREG) 3.125 MG tablet Take 1 tablet (3.125 mg total) by mouth 2 (two) times daily with a meal.   diphenhydrAMINE (BENADRYL) 50 MG tablet Take 1 tablet every 4-6 hours as needed for swelling.   famotidine (PEPCID) 10 MG tablet Take 10 mg by mouth 2 (two) times daily.   levothyroxine (SYNTHROID, LEVOTHROID) 50 MCG tablet Take 50 mcg by mouth daily before breakfast.   losartan (COZAAR) 100 MG tablet Take 100 mg by mouth daily before breakfast.    meclizine (ANTIVERT) 25 MG tablet Take 25 mg by mouth 2 (two) times daily as needed.   metoprolol tartrate (LOPRESSOR) 100 MG tablet Take 1 tablet (100mg ) TWO hours prior to CT scan   Multiple Vitamins-Minerals (MULTIVITAMIN WITH MINERALS) tablet Take 1 tablet by mouth daily.   ondansetron (ZOFRAN-ODT) 4 MG disintegrating tablet Take 4 mg by mouth 2 (two) times daily as needed.   Pancrelipase, Lip-Prot-Amyl, 24000-76000 units CPEP Take 1-3 capsules by mouth See admin instructions. Takes 2-3 capsules with meals and  1 capsule with snacks.   traZODone (DESYREL) 50 MG tablet TAKE 1/2 TABLET THE FIRST NIGHT. CAN INCREASE TO 1 TAB ON SUBSEQUENT NIGHTS IF NEEDED FOR SLEEP.     Allergies:   Tylenol [acetaminophen]   Social History   Socioeconomic History   Marital status: Single    Spouse name: Not on file   Number of children: Not on file   Years of education: Not on file   Highest education level: Not on file  Occupational History   Not on file  Tobacco Use   Smoking status: Never   Smokeless tobacco: Never  Substance and  Sexual Activity   Alcohol use: Yes    Comment: occassionally   Drug use: No   Sexual activity: Not on file  Other Topics Concern   Not on file  Social History Narrative   Not on file   Social Determinants of Health   Financial Resource Strain: Not on file  Food Insecurity: Not on file  Transportation Needs: Not on file  Physical Activity: Not on file  Stress: Not on file  Social Connections: Not on file     Family History: The patient's family history includes Diabetes in her father; Heart disease in her father; Hyperlipidemia in her father.  ROS:   Please see the history of present illness.     All other systems reviewed and are negative.  EKGs/Labs/Other Studies Reviewed:    The following studies were reviewed today:   EKG:   06/16/2022: Normal sinus rhythm, rate 71, Q wave in aVL, poor R wave progression  Recent Labs: 10/04/2021: BUN 19; Creatinine, Ser 0.74; Hemoglobin 13.0; Platelets 258; Potassium 3.6; Sodium 140  Recent Lipid Panel    Component Value Date/Time   CHOL 152 09/08/2017 0524   TRIG 37 09/08/2017 0524   HDL 57 09/08/2017 0524   CHOLHDL 2.7 09/08/2017 0524   VLDL 7 09/08/2017 0524   LDLCALC 88 09/08/2017 0524    Physical Exam:    VS:  BP 102/68   Pulse 81   Ht 5\' 1"  (1.549 m)   Wt 221 lb 6.4 oz (100.4 kg)   SpO2 98%   BMI 41.83 kg/m     Wt Readings from Last 3 Encounters:  06/16/22 221 lb 6.4 oz (100.4 kg)  06/08/22 220 lb 6.4 oz (100 kg)  05/31/22 222 lb (100.7 kg)     GEN:  Well nourished, well developed in no acute distress HEENT: Normal NECK: No JVD; No carotid bruits LYMPHATICS: No lymphadenopathy CARDIAC: RRR, no murmurs, rubs, gallops RESPIRATORY:  Clear to auscultation without rales, wheezing or rhonchi  ABDOMEN: Soft, non-tender, non-distended MUSCULOSKELETAL:  No edema; No deformity  SKIN: Warm and dry NEUROLOGIC:  Alert and oriented x 3 PSYCHIATRIC:  Normal affect   ASSESSMENT:    1. Shortness of breath   2.  Pre-procedure lab exam   3. Essential hypertension   4. Morbid obesity ([place])    PLAN:    Shortness of breath: Reports dyspnea with exertion, has been limiting her activities.  Concern for anginal equivalent, does have CAD risk factors (age, hypertension).  Recommend coronary CTA to rule out obstructive CAD.  Will give Lopressor 100 mg prior to study.  Recommend echocardiogram to rule out structural heart disease  Hypertension: On losartan 100 mg daily and carvedilol 3.25 mg twice daily and amlodipine 5 mg daily.  Appears controlled  Prediabetes: A1c 6.1%  OSA: on CPAP, reports compliance  Morbid obesity: Body mass index is 41.83  kg/m.  Diet/exercise encouraged   RTC in 4 months KDC   Medication Adjustments/Labs and Tests Ordered: Current medicines are reviewed at length with the patient today.  Concerns regarding medicines are outlined above.  Orders Placed This Encounter  Procedures   CT CORONARY MORPH W/CTA COR W/SCORE W/CA W/CM &/OR WO/CM   Basic metabolic panel   EKG 77-OEUM   ECHOCARDIOGRAM COMPLETE   Meds ordered this encounter  Medications   metoprolol tartrate (LOPRESSOR) 100 MG tablet    Sig: Take 1 tablet (100mg ) TWO hours prior to CT scan    Dispense:  1 tablet    Refill:  0    Patient Instructions  Medication Instructions:  Your physician recommends that you continue on your current medications as directed. Please refer to the Current Medication list given to you today.  *If you need a refill on your cardiac medications before your next appointment, please call your pharmacy*   Lab Work: BMET today  If you have labs (blood work) drawn today and your tests are completely normal, you will receive your results only by: [place] (if you have MyChart) OR A paper copy in the mail If you have any lab test that is abnormal or we need to change your treatment, we will call you to review the results.   Testing/Procedures: Coronary CTA-see instructions  below  Your physician has requested that you have an echocardiogram. Echocardiography is a painless test that uses sound waves to create images of your heart. It provides your doctor with information about the size and shape of your heart and how well your heart's chambers and valves are working. This procedure takes approximately one hour. There are no restrictions for this procedure. Please do NOT wear cologne, perfume, aftershave, or lotions (deodorant is allowed). Please arrive 15 minutes prior to your appointment time.  Follow-Up: At [facility], you and your health needs are our priority.  As part of our continuing mission to provide you with exceptional heart care, we have created designated Provider Care Teams.  These Care Teams include your primary Cardiologist (physician) and Advanced Practice Providers (APPs -  Physician Assistants and Nurse Practitioners) who all work together to provide you with the care you need, when you need it.  We recommend signing up for the patient portal called "MyChart".  Sign up information is provided on this After Visit Summary.  MyChart is used to connect with patients for Virtual Visits (Telemedicine).  Patients are able to view lab/test results, encounter notes, upcoming appointments, etc.  Non-urgent messages can be sent to your provider as well.   To learn more about what you can do with MyChart, go to [email or web address].    Your next appointment:   4 month(s)  The format for your next appointment:   In Person  Provider:   Dr. Gardiner Rhyme Other Instructions   Your cardiac CT will be scheduled at one of the below locations:   [facility] [address] [place],  [address] [phone]   If scheduled at [facility], please arrive at the [facility] and Children's Entrance (Entrance C2) of [facility] 30 minutes prior to test start time. You can use the FREE valet parking offered at entrance C  (encouraged to control the heart rate for the test)  Proceed to the [facility] Radiology Department (first floor) to check-in and test prep.  All radiology patients and guests should use entrance C2 at [facility], accessed from [place]  [facility], even though the hospital's physical address listed is [address]     Please follow these instructions carefully (unless otherwise directed):   On the Night Before the Test: Be sure to Drink plenty of water. Do not consume any caffeinated/decaffeinated beverages or chocolate 12 hours prior to your test. Do not take any antihistamines 12 hours prior to your test.  On the Day of the Test: Drink plenty of water until 1 hour prior to the test. Do not eat any food 1 hour prior to test. You may take your regular medications prior to the test.  HOLD carvedilol (COREG) AM of CT Take metoprolol (Lopressor) two hours prior to test. FEMALES- please wear underwire-free bra if available, avoid dresses & tight clothing      After the Test: Drink plenty of water. After receiving IV contrast, you may experience a mild flushed feeling. This is normal. On occasion, you may experience a mild rash up to 24 hours after the test. This is not dangerous. If this occurs, you can take Benadryl 25 mg and increase your fluid intake. If you experience trouble breathing, this can be serious. If it is severe call 911 IMMEDIATELY. If it is mild, please call our office. If you take any of these medications: Glipizide/Metformin, Avandament, Glucavance, please do not take 48 hours after completing test unless otherwise instructed.  We will call to schedule your test 2-4 weeks out understanding that some insurance companies will need an authorization prior to the service being performed.   For non-scheduling related questions, please contact the cardiac imaging nurse navigator should you have any questions/concerns: Marchia Bond, Cardiac Imaging  Nurse Navigator Gordy Clement, Cardiac Imaging Nurse Navigator [facility] Direct Office Dial: [phone]   For scheduling needs, including cancellations and rescheduling, please call [place], [phone].       Signed, Donato Heinz, MD  06/16/2022 9:32 AM    [facility]

## 2022-06-16 ENCOUNTER — Encounter: Payer: Self-pay | Admitting: Cardiology

## 2022-06-16 ENCOUNTER — Ambulatory Visit: Payer: BC Managed Care – PPO | Attending: Cardiology | Admitting: Cardiology

## 2022-06-16 VITALS — BP 102/68 | HR 81 | Ht 61.0 in | Wt 221.4 lb

## 2022-06-16 DIAGNOSIS — I10 Essential (primary) hypertension: Secondary | ICD-10-CM | POA: Diagnosis not present

## 2022-06-16 DIAGNOSIS — R0602 Shortness of breath: Secondary | ICD-10-CM | POA: Diagnosis not present

## 2022-06-16 DIAGNOSIS — Z01812 Encounter for preprocedural laboratory examination: Secondary | ICD-10-CM | POA: Diagnosis not present

## 2022-06-16 LAB — BASIC METABOLIC PANEL
BUN/Creatinine Ratio: 28 (ref 12–28)
BUN: 20 mg/dL (ref 8–27)
CO2: 28 mmol/L (ref 20–29)
Calcium: 9.4 mg/dL (ref 8.7–10.3)
Chloride: 103 mmol/L (ref 96–106)
Creatinine, Ser: 0.71 mg/dL (ref 0.57–1.00)
Glucose: 96 mg/dL (ref 70–99)
Potassium: 4.7 mmol/L (ref 3.5–5.2)
Sodium: 140 mmol/L (ref 134–144)
eGFR: 95 mL/min/{1.73_m2} (ref >59)

## 2022-06-16 MED ORDER — METOPROLOL TARTRATE 100 MG PO TABS: ORAL_TABLET | ORAL | 0 refills | Status: DC

## 2022-06-16 NOTE — Patient Instructions (Signed)
Medication Instructions:  Your physician recommends that you continue on your current medications as directed. Please refer to the Current Medication list given to you today.  *If you need a refill on your cardiac medications before your next appointment, please call your pharmacy*   Lab Work: BMET today  If you have labs (blood work) drawn today and your tests are completely normal, you will receive your results only by: MyChart Message (if you have MyChart) OR A paper copy in the mail If you have any lab test that is abnormal or we need to change your treatment, we will call you to review the results.   Testing/Procedures: Coronary CTA-see instructions below  Your physician has requested that you have an echocardiogram. Echocardiography is a painless test that uses sound waves to create images of your heart. It provides your doctor with information about the size and shape of your heart and how well your heart's chambers and valves are working. This procedure takes approximately one hour. There are no restrictions for this procedure. Please do NOT wear cologne, perfume, aftershave, or lotions (deodorant is allowed). Please arrive 15 minutes prior to your appointment time.  Follow-Up: At [facility], you and your health needs are our priority.  As part of our continuing mission to provide you with exceptional heart care, we have created designated Provider Care Teams.  These Care Teams include your primary Cardiologist (physician) and Advanced Practice Providers (APPs -  Physician Assistants and Nurse Practitioners) who all work together to provide you with the care you need, when you need it.  We recommend signing up for the patient portal called "MyChart".  Sign up information is provided on this After Visit Summary.  MyChart is used to connect with patients for Virtual Visits (Telemedicine).  Patients are able to view lab/test results, encounter notes, upcoming appointments,  etc.  Non-urgent messages can be sent to your provider as well.   To learn more about what you can do with MyChart, go to [email or web address].    Your next appointment:   4 month(s)  The format for your next appointment:   In Person  Provider:   Dr. Bjorn Pippin Other Instructions   Your cardiac CT will be scheduled at one of the below locations:   [facility] [address] [place], [place] [address] [phone]   If scheduled at [facility], please arrive at the [facility] and Children's Entrance (Entrance C2) of [facility] 30 minutes prior to test start time. You can use the FREE valet parking offered at entrance C (encouraged to control the heart rate for the test)  Proceed to the [facility] Radiology Department (first floor) to check-in and test prep.  All radiology patients and guests should use entrance C2 at [facility], accessed from [facility], even though the hospital's physical address listed is [address].     Please follow these instructions carefully (unless otherwise directed):   On the Night Before the Test: Be sure to Drink plenty of water. Do not consume any caffeinated/decaffeinated beverages or chocolate 12 hours prior to your test. Do not take any antihistamines 12 hours prior to your test.  On the Day of the Test: Drink plenty of water until 1 hour prior to the test. Do not eat any food 1 hour prior to test. You may take your regular medications prior to the test.  HOLD carvedilol (COREG) AM of CT Take metoprolol (Lopressor) two hours prior  to test. FEMALES- please wear underwire-free bra if available, avoid dresses & tight clothing      After the Test: Drink plenty of water. After receiving IV contrast, you may experience a mild flushed feeling. This is normal. On occasion, you may experience a mild rash up to 24 hours after the test. This is not dangerous. If this occurs, you can  take Benadryl 25 mg and increase your fluid intake. If you experience trouble breathing, this can be serious. If it is severe call 911 IMMEDIATELY. If it is mild, please call our office. If you take any of these medications: Glipizide/Metformin, Avandament, Glucavance, please do not take 48 hours after completing test unless otherwise instructed.  We will call to schedule your test 2-4 weeks out understanding that some insurance companies will need an authorization prior to the service being performed.   For non-scheduling related questions, please contact the cardiac imaging nurse navigator should you have any questions/concerns: Marchia Bond, Cardiac Imaging Nurse Navigator Gordy Clement, Cardiac Imaging Nurse Navigator [place] [facility] Direct Office Dial: [phone]   For scheduling needs, including cancellations and rescheduling, please call [place], [phone].

## 2022-06-20 ENCOUNTER — Encounter: Payer: Self-pay | Admitting: *Deleted

## 2022-06-23 DIAGNOSIS — G4733 Obstructive sleep apnea (adult) (pediatric): Secondary | ICD-10-CM | POA: Diagnosis not present

## 2022-06-27 DIAGNOSIS — G4733 Obstructive sleep apnea (adult) (pediatric): Secondary | ICD-10-CM | POA: Diagnosis not present

## 2022-07-03 ENCOUNTER — Telehealth (HOSPITAL_COMMUNITY): Payer: Self-pay | Admitting: *Deleted

## 2022-07-03 ENCOUNTER — Other Ambulatory Visit (HOSPITAL_COMMUNITY): Payer: Self-pay | Admitting: *Deleted

## 2022-07-03 ENCOUNTER — Ambulatory Visit (HOSPITAL_BASED_OUTPATIENT_CLINIC_OR_DEPARTMENT_OTHER): Payer: BC Managed Care – PPO

## 2022-07-03 DIAGNOSIS — R0602 Shortness of breath: Secondary | ICD-10-CM | POA: Insufficient documentation

## 2022-07-03 LAB — ECHOCARDIOGRAM COMPLETE
AR max vel: 1.52 cm2
AV Area VTI: 1.78 cm2
AV Area mean vel: 1.51 cm2
AV Mean grad: 6 mmHg
AV Peak grad: 11.7 mmHg
Ao pk vel: 1.71 m/s
Area-P 1/2: 5.5 cm2
S' Lateral: 3.3 cm

## 2022-07-03 MED ORDER — METOPROLOL TARTRATE 100 MG PO TABS: ORAL_TABLET | ORAL | 0 refills | Status: DC

## 2022-07-03 NOTE — Telephone Encounter (Signed)
Reaching out to patient to offer assistance regarding upcoming cardiac imaging study; pt verbalizes understanding of appt date/time, parking situation and where to check in, medications ordered, and verified current allergies; name and call back number provided for further questions should they arise  Larey Brick RN Navigator Cardiac Imaging [facility] [phone] office [phone] cell  Patient to take 100mg  metoprolol tartrate two hours prior to her cardiac CT scan.  She is aware to arrive at 9am.

## 2022-07-04 ENCOUNTER — Ambulatory Visit (HOSPITAL_COMMUNITY)
Admission: RE | Admit: 2022-07-04 | Discharge: 2022-07-04 | Disposition: A | Discharge status: Home / Self Care | Payer: BC Managed Care – PPO | Source: Ambulatory Visit | Attending: Cardiology | Admitting: Cardiology

## 2022-07-04 DIAGNOSIS — R0602 Shortness of breath: Secondary | ICD-10-CM

## 2022-07-04 DIAGNOSIS — I2510 Atherosclerotic heart disease of native coronary artery without angina pectoris: Secondary | ICD-10-CM

## 2022-07-04 MED ORDER — NITROGLYCERIN 0.4 MG SL SUBL
SUBLINGUAL_TABLET | SUBLINGUAL | Status: AC
  Filled 2022-07-04: qty 2

## 2022-07-04 MED ORDER — IOHEXOL 350 MG/ML SOLN
100.0000 mL | Freq: Once | INTRAVENOUS | Status: AC | PRN
  Administered 2022-07-04: 100 mL via INTRAVENOUS

## 2022-07-04 MED ORDER — NITROGLYCERIN 0.4 MG SL SUBL
0.8000 mg | SUBLINGUAL_TABLET | Freq: Once | SUBLINGUAL | Status: AC
  Administered 2022-07-04: 0.8 mg via SUBLINGUAL

## 2022-07-10 DIAGNOSIS — N952 Postmenopausal atrophic vaginitis: Secondary | ICD-10-CM | POA: Diagnosis not present

## 2022-07-10 DIAGNOSIS — R350 Frequency of micturition: Secondary | ICD-10-CM | POA: Diagnosis not present

## 2022-07-11 ENCOUNTER — Telehealth: Payer: Self-pay | Admitting: Cardiology

## 2022-07-11 NOTE — Telephone Encounter (Signed)
See result note.  

## 2022-07-11 NOTE — Telephone Encounter (Signed)
Pt returning nurses call regarding results. Please advise 

## 2022-07-12 DIAGNOSIS — Z23 Encounter for immunization: Secondary | ICD-10-CM | POA: Diagnosis not present

## 2022-07-23 DIAGNOSIS — G4733 Obstructive sleep apnea (adult) (pediatric): Secondary | ICD-10-CM | POA: Diagnosis not present

## 2022-07-31 ENCOUNTER — Other Ambulatory Visit: Payer: Self-pay | Admitting: *Deleted

## 2022-07-31 MED ORDER — ATORVASTATIN CALCIUM 10 MG PO TABS: 10.0000 mg | ORAL_TABLET | Freq: Every day | ORAL | 3 refills | Status: DC

## 2022-10-04 ENCOUNTER — Telehealth: Payer: Self-pay | Admitting: *Deleted

## 2022-10-04 NOTE — Telephone Encounter (Signed)
Primary Cardiologist:Christopher Fritz Pickerel, MD   Preoperative team, please contact this patient and set up a phone call appointment or patient may choose to get clearance at her upcoming appointment with Dr. Gardiner Rhyme on 10/24/22 for further preoperative risk assessment. Please obtain consent and complete medication review. Thank you for your help.   From a cardiology perspective, she may hold aspirin for 5 to 7 days per her preference of requesting provider. Need to ensure that patient does not take aspirin for an additional non-cardiac indication.   Emmaline Life, NP-C  10/04/2022, 11:12 AM [address] Office [phone] Fax [phone]

## 2022-10-04 NOTE — Telephone Encounter (Signed)
   Pre-operative Risk Assessment    Patient Name: [name]  DOB: Sep 02, 1955 MRN: [id number]      Request for Surgical Clearance    Procedure:   Right Total Knee Arthroplasty  Date of Surgery:  Clearance TBD                                 Surgeon:  Dr. Rod Can Surgeon's Group or Practice Name:  Dr. Rod Can Phone number:  [phone] Fax number:  [phone]   Type of Clearance Requested:   - Medical  - Pharmacy:  Hold Aspirin TBD   Type of Anesthesia:  Spinal   Additional requests/questions:    Signed, Greer Ee   10/04/2022, 10:52 AM

## 2022-10-04 NOTE — Telephone Encounter (Signed)
I s/w the pt and she asked if we could move her appt up sooner with Dr. Gardiner Rhyme for pre op clearance. I was able to find and appt 10/06/22 @ 8:20 with Dr. Gardiner Rhyme. We agreed to not cancel the 10/2022 appt until the pt see's MD. I informed the pt that if Dr. Gardiner Rhyme feels she does not need to keep the March appt he will have his nurse cancel March appt. Pt said ok. I will update all parties involved.

## 2022-10-05 NOTE — Progress Notes (Addendum)
Cardiology Office Note:    Date:  10/06/2022   ID:  [name], [name] 04/22/1956, MRN [id number]  PCP:  Donald Prose, MD  Cardiologist:  Donato Heinz, MD  Electrophysiologist:  None   Referring MD: Donald Prose, MD   Chief Complaint  Patient presents with   Shortness of Breath    History of Present Illness:    [name] is a 67 y.o. female with a hx of hypertension, GERD, OSA who presents for follow-up.  She was referred by Marland Kitchen, NP for evaluation of shortness of breath, initially seen 06/16/22.  Underwent Lexiscan Myoview 09/08/2017 which showed no reversible perfusion defect, possible basal septal fixed defect thought to represent attenuation artifact, EF 52%.  She reports she has been having shortness of breath, especially with walking uphill.  She denies any chest pain.  Reports she swims 3 times per week, has had shortness of breath while exerting herself.  Tries to do 30 minutes in the pool but has been limited recently by dyspnea.  Reports some lightheadedness but denies any syncope, attributes to vertigo.  Reports some lower extremity edema.  Reports rare palpitations.  No smoking history.  No history of heart disease in her immediate family.  LDL 68 on 01/16/2022.  Echocardiogram 07/03/2022 showed EF 55 to 60%, normal RV function, mild to moderate mitral regurgitation, mild aortic regurgitation, mild dilatation of ascending aorta measuring 39 mm.  Coronary CTA on 07/04/2022 showed nonobstructive CAD with mild stenosis in proximal LAD, calcium score 87 (81st percentile).  Since last clinic visit, she reports she is doing okay.  States that shortness of breath has improved.  Reports some lightheadedness but denies any syncope.  Also with intermittent lower extremity edema.  She denies any chest pain.  She is planning to start going to the gym tomorrow to start swimming.  She is planning knee surgery.  Can walk up flight of stairs without stopping,  denies any exertional symptoms.  Past Medical History:  Diagnosis Date   GERD (gastroesophageal reflux disease)    Heart disorder    Heart Muscle Spasms   Hypertension    Pancreatic insufficiency     Past Surgical History:  Procedure Laterality Date   CESAREAN SECTION     ESOPHAGOGASTRODUODENOSCOPY (EGD) WITH PROPOFOL N/A 01/01/2013   Procedure: ESOPHAGOGASTRODUODENOSCOPY (EGD) WITH PROPOFOL;  Surgeon: Arta Silence, MD;  Location: [facility];  Service: Endoscopy;  Laterality: N/A;   EUS N/A 01/01/2013   Procedure: ESOPHAGEAL ENDOSCOPIC ULTRASOUND (EUS) RADIAL;  Surgeon: Arta Silence, MD;  Location: [facility];  Service: Endoscopy;  Laterality: N/A;   LEFT HEART CATH AND CORONARY ANGIOGRAPHY  2010   NASAL SINUS SURGERY     TUBAL LIGATION  2001    Current Medications: Current Meds  Medication Sig   albuterol (VENTOLIN HFA) 108 (90 Base) MCG/ACT inhaler Inhale 2 puffs into the lungs every 6 (six) hours as needed for wheezing or shortness of breath.   amLODipine (NORVASC) 5 MG tablet Take 5 mg by mouth daily before breakfast.    aspirin EC 81 MG tablet Take 81 mg by mouth daily.   Azelastine HCl 137 MCG/SPRAY SOLN Place 2 sprays into both nostrils 2 (two) times daily.   diphenhydrAMINE (BENADRYL) 50 MG tablet Take 1 tablet every 4-6 hours as needed for swelling.   famotidine (PEPCID) 10 MG tablet Take 10 mg by mouth 2 (two) times daily.   levothyroxine (SYNTHROID, LEVOTHROID) 50 MCG tablet Take 50 mcg by mouth daily before  breakfast.   losartan (COZAAR) 100 MG tablet Take 100 mg by mouth daily before breakfast.    Multiple Vitamins-Minerals (MULTIVITAMIN WITH MINERALS) tablet Take 1 tablet by mouth daily.   ondansetron (ZOFRAN-ODT) 4 MG disintegrating tablet Take 4 mg by mouth 2 (two) times daily as needed.   traZODone (DESYREL) 50 MG tablet TAKE 1/2 TABLET THE FIRST NIGHT. CAN INCREASE TO 1 TAB ON SUBSEQUENT NIGHTS IF NEEDED FOR SLEEP.   [DISCONTINUED] atorvastatin (LIPITOR)  10 MG tablet Take 1 tablet (10 mg total) by mouth daily.   [DISCONTINUED] carvedilol (COREG) 3.125 MG tablet Take 1 tablet (3.125 mg total) by mouth 2 (two) times daily with a meal.     Allergies:   Tylenol [acetaminophen]   Social History   Socioeconomic History   Marital status: Single    Spouse name: Not on file   Number of children: Not on file   Years of education: Not on file   Highest education level: Not on file  Occupational History   Not on file  Tobacco Use   Smoking status: Never   Smokeless tobacco: Never  Substance and Sexual Activity   Alcohol use: Yes    Comment: occassionally   Drug use: No   Sexual activity: Not on file  Other Topics Concern   Not on file  Social History Narrative   Not on file   Social Determinants of Health   Financial Resource Strain: Not on file  Food Insecurity: Not on file  Transportation Needs: Not on file  Physical Activity: Not on file  Stress: Not on file  Social Connections: Not on file     Family History: The patient's family history includes Diabetes in her father; Heart disease in her father; Hyperlipidemia in her father.  ROS:   Please see the history of present illness.     All other systems reviewed and are negative.  EKGs/Labs/Other Studies Reviewed:    The following studies were reviewed today:   EKG:   06/16/2022: Normal sinus rhythm, rate 71, Q wave in aVL, poor R wave progression  Recent Labs: 06/16/2022: BUN 20; Creatinine, Ser 0.71; Potassium 4.7; Sodium 140  Recent Lipid Panel    Component Value Date/Time   CHOL 152 09/08/2017 0524   TRIG 37 09/08/2017 0524   HDL 57 09/08/2017 0524   CHOLHDL 2.7 09/08/2017 0524   VLDL 7 09/08/2017 0524   LDLCALC 88 09/08/2017 0524    Physical Exam:    VS:  BP 127/72   Pulse 86   Ht '5\' 1"'$  (1.549 m)   Wt 224 lb 3.2 oz (101.7 kg)   SpO2 96%   BMI 42.36 kg/m     Wt Readings from Last 3 Encounters:  10/06/22 224 lb 3.2 oz (101.7 kg)  06/16/22 221 lb 6.4  oz (100.4 kg)  06/08/22 220 lb 6.4 oz (100 kg)     GEN:  Well nourished, well developed in no acute distress HEENT: Normal NECK: No JVD; No carotid bruits LYMPHATICS: No lymphadenopathy CARDIAC: RRR, no murmurs, rubs, gallops RESPIRATORY:  Clear to auscultation without rales, wheezing or rhonchi  ABDOMEN: Soft, non-tender, non-distended MUSCULOSKELETAL:  No edema; No deformity  SKIN: Warm and dry NEUROLOGIC:  Alert and oriented x 3 PSYCHIATRIC:  Normal affect   ASSESSMENT:    1. CAD in native artery   2. Pre-op evaluation   3. Essential hypertension   4. Hyperlipidemia, unspecified hyperlipidemia type   5. Morbid obesity ([place])     PLAN:  CAD: Reported dyspnea on exertion.  Echocardiogram 07/03/2022 showed EF 55 to 60%, normal RV function, mild to moderate mitral regurgitation, mild aortic regurgitation, mild dilatation of ascending aorta measuring 39 mm.  Coronary CTA on 07/04/2022 showed nonobstructive CAD with mild stenosis in proximal LAD, calcium score 87 (81st percentile). -Recommend starting atorvastatin but she has not started yet.  She is now agreeable to starting.  Will start atorvastatin 10 mg daily  Mild regurgitation: Mild to moderate on echocardiogram 06/2022.  Will monitor  Preop evaluation: Prior to knee surgery.  Coronary CTA with nonobstructive CAD as above.  Echocardiogram with normal biventricular function and no significant valvular disease as above.   -She is low risk for an intermediate risk surgery.  No further cardiac workup recommended prior to surgery -Ok to hold ASA 7 days prior to surgery and resume post op per the surgeon instructions   Hypertension: On losartan 100 mg daily and amlodipine 5 mg daily.  Reports she stopped taking carvedilol.  Appears controlled  Prediabetes: A1c 6.3% on 08/22/2022  OSA: on CPAP, reports compliance  Morbid obesity: Body mass index is 42.36 kg/m.  Diet/exercise encouraged  Hyperlipidemia: LDL 99 on 08/22/2022.   Goal LDL less than 70 given CAD as above.  Start atorvastatin 10 mg daily   RTC in 6 months   Medication Adjustments/Labs and Tests Ordered: Current medicines are reviewed at length with the patient today.  Concerns regarding medicines are outlined above.  Orders Placed This Encounter  Procedures   Lipid panel   Meds ordered this encounter  Medications   atorvastatin (LIPITOR) 10 MG tablet    Sig: Take 1 tablet (10 mg total) by mouth daily.    Dispense:  90 tablet    Refill:  3    Patient Instructions  Medication Instructions:  START atorvastatin (Lipitor) 10 mg daily  *If you need a refill on your cardiac medications before your next appointment, please call your pharmacy*   Lab Work: Please return for FASTING labs in 2 months (Lipid)  Our in office lab hours are Monday-Friday 8:00-4:00, closed for lunch 12:45-1:45 pm.  No appointment needed.  [facility] locations:   [place] ([place]) - [id number] [place] [address] - [address] [place] - [address] ([facility])  Follow-Up: At [facility], you and your health needs are our priority.  As part of our continuing mission to provide you with exceptional heart care, we have created designated Provider Care Teams.  These Care Teams include your primary Cardiologist (physician) and Advanced Practice Providers (APPs -  Physician Assistants and Nurse Practitioners) who all work together to provide you with the care you need, when you need it.  We recommend signing up for the patient portal called "MyChart".  Sign up information is provided on this After Visit Summary.  MyChart is used to connect with patients for Virtual Visits  (Telemedicine).  Patients are able to view lab/test results, encounter notes, upcoming appointments, etc.  Non-urgent messages can be sent to your provider as well.   To learn more about what you can do with MyChart, go to [email or web address].  Your next appointment:   6 month(s)  Provider:   Donato Heinz, MD        Signed, Donato Heinz, MD  10/06/2022 9:17 AM    [place]

## 2022-10-06 ENCOUNTER — Ambulatory Visit: Payer: Medicare Other | Attending: Cardiology | Admitting: Cardiology

## 2022-10-06 VITALS — BP 127/72 | HR 86 | Ht 61.0 in | Wt 224.2 lb

## 2022-10-06 DIAGNOSIS — I2510 Atherosclerotic heart disease of native coronary artery without angina pectoris: Secondary | ICD-10-CM | POA: Insufficient documentation

## 2022-10-06 DIAGNOSIS — I10 Essential (primary) hypertension: Secondary | ICD-10-CM | POA: Insufficient documentation

## 2022-10-06 DIAGNOSIS — E785 Hyperlipidemia, unspecified: Secondary | ICD-10-CM | POA: Diagnosis present

## 2022-10-06 DIAGNOSIS — Z01818 Encounter for other preprocedural examination: Secondary | ICD-10-CM | POA: Insufficient documentation

## 2022-10-06 MED ORDER — ATORVASTATIN CALCIUM 10 MG PO TABS: 10.0000 mg | ORAL_TABLET | Freq: Every day | ORAL | 3 refills | Status: DC

## 2022-10-06 NOTE — Patient Instructions (Signed)
Medication Instructions:  START atorvastatin (Lipitor) 10 mg daily  *If you need a refill on your cardiac medications before your next appointment, please call your pharmacy*   Lab Work: Please return for FASTING labs in 2 months (Lipid)  Our in office lab hours are Monday-Friday 8:00-4:00, closed for lunch 12:45-1:45 pm.  No appointment needed.  [facility] locations:   [place] ([place]) - [id number] [place] [address] [place] [address] - [address] [place] - [address] ([facility])  Follow-Up: At [facility], you and your health needs are our priority.  As part of our continuing mission to provide you with exceptional heart care, we have created designated Provider Care Teams.  These Care Teams include your primary Cardiologist (physician) and Advanced Practice Providers (APPs -  Physician Assistants and Nurse Practitioners) who all work together to provide you with the care you need, when you need it.  We recommend signing up for the patient portal called "MyChart".  Sign up information is provided on this After Visit Summary.  MyChart is used to connect with patients for Virtual Visits (Telemedicine).  Patients are able to view lab/test results, encounter notes, upcoming appointments, etc.  Non-urgent messages can be sent to your provider as well.   To learn more about what you can do with MyChart, go to [email or web address].    Your next appointment:   6 month(s)  Provider:   Donato Heinz, MD

## 2022-10-13 NOTE — Telephone Encounter (Signed)
Surgery scheduler [facility] called me this morning in regard she received the clearance notes from Dr. Gardiner Rhyme giving clearance. However, Dr. Gardiner Rhyme did not note in his notes about ASA hold. See notes in clearance from pre op APP ok to hold ASA 5-7 days prior and resume post op.   This needs to be noted in MD ov notes as well that he is in agreement with the recommendations from pre op APP.   I assured [name] I will forward to Dr. Gardiner Rhyme and (pre op as Juluis Rainier). Dr. Gardiner Rhyme will need to amend his ov note stating ok to hold ASA 5-7 days prior to surgery and resume post op per the surgeon instructions. Once notes have been amended we will fax over to the surgeon office.   Per [name], the surgeon and the anesthesiologist both require provider notes to reflect if clearance has been given as well as any medication recommendations that need to be held.

## 2022-10-16 NOTE — Telephone Encounter (Signed)
Noted refaxed to Dr. Lyla Glassing via Epic

## 2022-10-16 NOTE — Telephone Encounter (Signed)
Note addended

## 2022-10-24 ENCOUNTER — Ambulatory Visit: Payer: BC Managed Care – PPO | Admitting: Cardiology

## 2022-11-05 ENCOUNTER — Other Ambulatory Visit: Payer: Self-pay | Admitting: Nurse Practitioner

## 2022-11-05 DIAGNOSIS — F5102 Adjustment insomnia: Secondary | ICD-10-CM

## 2022-11-30 NOTE — Progress Notes (Signed)
Sent message, via epic in basket, requesting orders in epic from surgeon.  

## 2022-12-05 ENCOUNTER — Ambulatory Visit: Payer: Self-pay | Admitting: Student

## 2022-12-06 NOTE — Patient Instructions (Addendum)
SURGICAL WAITING ROOM VISITATION  Patients having surgery or a procedure may have no more than 2 support people in the waiting area - these visitors may rotate.    Children under the age of 43 must have an adult with them who is not the patient.  Due to an increase in RSV and influenza rates and associated hospitalizations, children ages 46 and under may not visit patients in [facility] hospitals.  If the patient needs to stay at the hospital during part of their recovery, the visitor guidelines for inpatient rooms apply. Pre-op nurse will coordinate an appropriate time for 1 support person to accompany patient in pre-op.  This support person may not rotate.    Please refer to the [facility] website for the visitor guidelines for Inpatients (after your surgery is over and you are in a regular room).       Your procedure is scheduled on:  12/20/2022    Report to [facility] Main Entrance    Report to admitting at   0600AM   Call this number if you have problems the morning of surgery [phone]   Do not eat food :After Midnight.   After Midnight you may have the following liquids until _  0530_____ AM  DAY OF SURGERY  Water Non-Citrus Juices (without pulp, NO RED-Apple, White grape, White cranberry) Black Coffee (NO MILK/CREAM OR CREAMERS, sugar ok)  Clear Tea (NO MILK/CREAM OR CREAMERS, sugar ok) regular and decaf                             Plain Jell-O (NO RED)                                           Fruit ices (not with fruit pulp, NO RED)                                     Popsicles (NO RED)                                                               Sports drinks like Gatorade (NO RED)                    The day of surgery:  Drink ONE (1) Pre-Surgery Clear Ensure or G2 at   0530AM  ( have completed by ) the morning of surgery. Drink in one sitting. Do not sip.  This drink was given to you during your hospital  pre-op appointment visit. Nothing else to  drink after completing the  Pre-Surgery Clear Ensure or G2.          If you have questions, please contact your surgeon's office.       Oral Hygiene is also important to reduce your risk of infection.                                    Remember - BRUSH YOUR TEETH THE MORNING OF  SURGERY WITH YOUR REGULAR TOOTHPASTE  DENTURES WILL BE REMOVED PRIOR TO SURGERY PLEASE DO NOT APPLY "Poly grip" OR ADHESIVES!!!   Do NOT smoke after Midnight   Take these medicines the morning of surgery with A SIP OF WATER:  inhalers as usual and bring, amlodipine, nasal spray, pepcid, synthroid   DO NOT TAKE ANY ORAL DIABETIC MEDICATIONS DAY OF YOUR SURGERY  Bring CPAP mask and tubing day of surgery.                              You may not have any metal on your body including hair pins, jewelry, and body piercing             Do not wear make-up, lotions, powders, perfumes/cologne, or deodorant  Do not wear nail polish including gel and S&S, artificial/acrylic nails, or any other type of covering on natural nails including finger and toenails. If you have artificial nails, gel coating, etc. that needs to be removed by a nail salon please have this removed prior to surgery or surgery may need to be canceled/ delayed if the surgeon/ anesthesia feels like they are unable to be safely monitored.   Do not shave  48 hours prior to surgery.               Men may shave face and neck.   Do not bring valuables to the hospital. [facility] IS NOT             RESPONSIBLE   FOR VALUABLES.   Contacts, glasses, dentures or bridgework may not be worn into surgery.   Bring small overnight bag day of surgery.   DO NOT BRING YOUR HOME MEDICATIONS TO THE HOSPITAL. PHARMACY WILL DISPENSE MEDICATIONS LISTED ON YOUR MEDICATION LIST TO YOU DURING YOUR ADMISSION IN THE HOSPITAL!    Patients discharged on the day of surgery will not be allowed to drive home.  Someone NEEDS to stay with you for the first 24 hours after  anesthesia.   Special Instructions: Bring a copy of your healthcare power of attorney and living will documents the day of surgery if you haven't scanned them before.              Please read over the following fact sheets you were given: IF YOU HAVE QUESTIONS ABOUT YOUR PRE-OP INSTRUCTIONS PLEASE CALL [phone]   If you received a COVID test during your pre-op visit  it is requested that you wear a mask when out in public, stay away from anyone that may not be feeling well and notify your surgeon if you develop symptoms. If you test positive for Covid or have been in contact with anyone that has tested positive in the last 10 days please notify you surgeon.

## 2022-12-06 NOTE — Progress Notes (Addendum)
Anesthesia Review:  [id number] Sun  Clearance 10/03/22-  Cardiologist : Epifanio Lesches- LOV 10/06/22 on chart  Pulmonology- Micheline Maze, NP LOV 05/31/22  Chest x-ray : EKG : 06/16/22  CT cors- 07/04/22  Echo : 07/03/22  Stress test: 2019  PFT- 06/01/22  Home sleep test- 02/15/22 Cardiac Cath :  Activity level:  Sleep Study/ CPAP : Fasting Blood Sugar :      / Checks Blood Sugar -- times a day:   Blood Thinner/ Instructions /Last Dose: ASA / Instructions/ Last Dose :

## 2022-12-07 ENCOUNTER — Encounter (HOSPITAL_COMMUNITY)
Admission: RE | Admit: 2022-12-07 | Discharge: 2022-12-07 | Disposition: A | Discharge status: Home / Self Care | Payer: Medicare Other | Source: Ambulatory Visit | Attending: Orthopedic Surgery | Admitting: Orthopedic Surgery

## 2022-12-07 ENCOUNTER — Encounter (HOSPITAL_COMMUNITY): Payer: Self-pay

## 2022-12-07 ENCOUNTER — Other Ambulatory Visit: Payer: Self-pay

## 2022-12-07 VITALS — BP 152/97 | HR 74 | Temp 98.2°F | Resp 16 | Ht 61.0 in | Wt 184.0 lb

## 2022-12-07 DIAGNOSIS — K219 Gastro-esophageal reflux disease without esophagitis: Secondary | ICD-10-CM | POA: Insufficient documentation

## 2022-12-07 DIAGNOSIS — E139 Other specified diabetes mellitus without complications: Secondary | ICD-10-CM | POA: Insufficient documentation

## 2022-12-07 DIAGNOSIS — I10 Essential (primary) hypertension: Secondary | ICD-10-CM | POA: Diagnosis not present

## 2022-12-07 DIAGNOSIS — G4730 Sleep apnea, unspecified: Secondary | ICD-10-CM | POA: Diagnosis not present

## 2022-12-07 DIAGNOSIS — I2510 Atherosclerotic heart disease of native coronary artery without angina pectoris: Secondary | ICD-10-CM | POA: Insufficient documentation

## 2022-12-07 DIAGNOSIS — M1711 Unilateral primary osteoarthritis, right knee: Secondary | ICD-10-CM | POA: Insufficient documentation

## 2022-12-07 DIAGNOSIS — Z01812 Encounter for preprocedural laboratory examination: Secondary | ICD-10-CM | POA: Insufficient documentation

## 2022-12-07 DIAGNOSIS — Z01818 Encounter for other preprocedural examination: Secondary | ICD-10-CM

## 2022-12-07 HISTORY — DX: Sleep apnea, unspecified: G47.30

## 2022-12-07 HISTORY — DX: Prediabetes: R73.03

## 2022-12-07 HISTORY — DX: Cardiac murmur, unspecified: R01.1

## 2022-12-07 HISTORY — DX: Family history of other specified conditions: Z84.89

## 2022-12-07 HISTORY — DX: Dyspnea, unspecified: R06.00

## 2022-12-07 HISTORY — DX: Hypothyroidism, unspecified: E03.9

## 2022-12-07 HISTORY — DX: Unspecified osteoarthritis, unspecified site: M19.90

## 2022-12-07 HISTORY — DX: Other specified postprocedural states: Z98.890

## 2022-12-07 LAB — CBC
HCT: 41.8 % (ref 36.0–46.0)
Hemoglobin: 13.1 g/dL (ref 12.0–15.0)
MCH: 25.2 pg — ABNORMAL LOW (ref 26.0–34.0)
MCHC: 31.3 g/dL (ref 30.0–36.0)
MCV: 80.4 fL (ref 80.0–100.0)
Platelets: 271 10*3/uL (ref 150–400)
RBC: 5.2 MIL/uL — ABNORMAL HIGH (ref 3.87–5.11)
RDW: 15 % (ref 11.5–15.5)
WBC: 7.1 10*3/uL (ref 4.0–10.5)
nRBC: 0 % (ref 0.0–0.2)

## 2022-12-07 LAB — HEMOGLOBIN A1C
Hgb A1c MFr Bld: 6.1 % — ABNORMAL HIGH (ref 4.8–5.6)
Mean Plasma Glucose: 128.37 mg/dL

## 2022-12-07 LAB — BASIC METABOLIC PANEL
Anion gap: 8 (ref 5–15)
BUN: 14 mg/dL (ref 8–23)
CO2: 26 mmol/L (ref 22–32)
Calcium: 9.1 mg/dL (ref 8.9–10.3)
Chloride: 105 mmol/L (ref 98–111)
Creatinine, Ser: 0.67 mg/dL (ref 0.44–1.00)
GFR, Estimated: >60 mL/min (ref >60)
Glucose, Bld: 110 mg/dL — ABNORMAL HIGH (ref 70–99)
Potassium: 3.9 mmol/L (ref 3.5–5.1)
Sodium: 139 mmol/L (ref 135–145)

## 2022-12-07 LAB — GLUCOSE, CAPILLARY: Glucose-Capillary: 98 mg/dL (ref 70–99)

## 2022-12-07 LAB — SURGICAL PCR SCREEN
MRSA, PCR: NEGATIVE
Staphylococcus aureus: NEGATIVE

## 2022-12-13 NOTE — Anesthesia Preprocedure Evaluation (Addendum)
Anesthesia Evaluation  Patient identified by MRN, date of birth, ID band Patient awake    Reviewed: Allergy & Precautions, NPO status , Patient's Chart, lab work & pertinent test results  History of Anesthesia Complications (+) PONV and history of anesthetic complications  Airway Mallampati: III  TM Distance: >3 FB Neck ROM: Full    Dental no notable dental hx. (+) Dental Advisory Given   Pulmonary sleep apnea and Continuous Positive Airway Pressure Ventilation    Pulmonary exam normal        Cardiovascular hypertension, Pt. on medications and Pt. on home beta blockers Normal cardiovascular exam  Echo 07/03/2022 1. Left ventricular ejection fraction, by estimation, is 55 to 60%. The  left ventricle has normal function. The left ventricle has no regional  wall motion abnormalities. Left ventricular diastolic parameters are  indeterminate. The average left  ventricular global longitudinal strain is -21.4 %. The global longitudinal  strain is normal.   2. Right ventricular systolic function is normal. The right ventricular  size is normal. There is normal pulmonary artery systolic pressure.   3. The mitral valve is normal in structure. Mild to moderate mitral valve  regurgitation. No evidence of mitral stenosis.   4. The aortic valve is normal in structure. Aortic valve regurgitation is  mild. Aortic valve sclerosis/calcification is present, without any  evidence of aortic stenosis.   5. There is mild dilatation of the ascending aorta, measuring 39 mm.   6. The inferior vena cava is normal in size with greater than 50%  respiratory variability, suggesting right atrial pressure of 3 mmHg.     Neuro/Psych negative neurological ROS     GI/Hepatic Neg liver ROS,GERD  Medicated,,  Endo/Other  Hypothyroidism    Renal/GU negative Renal ROS     Musculoskeletal negative musculoskeletal ROS (+)    Abdominal   Peds   Hematology negative hematology ROS (+)   Anesthesia Other Findings   Reproductive/Obstetrics                             Anesthesia Physical Anesthesia Plan  ASA: 3  Anesthesia Plan: MAC and Spinal   Post-op Pain Management: Tylenol PO (pre-op)*, Toradol IV (intra-op)* and Regional block*   Induction:   PONV Risk Score and Plan: 4 or greater and Ondansetron, Propofol infusion and Midazolam  Airway Management Planned: Natural Airway  Additional Equipment:   Intra-op Plan:   Post-operative Plan:   Informed Consent: I have reviewed the patients History and Physical, chart, labs and discussed the procedure including the risks, benefits and alternatives for the proposed anesthesia with the patient or authorized representative who has indicated his/her understanding and acceptance.     Dental advisory given  Plan Discussed with: Anesthesiologist and CRNA  Anesthesia Plan Comments: (See PAT note 12/07/2022)       Anesthesia Quick Evaluation

## 2022-12-13 NOTE — Progress Notes (Signed)
Anesthesia chart review   Case: [id number] Date/Time: 12/20/22 0815   Procedure: COMPUTER ASSISTED TOTAL KNEE ARTHROPLASTY (Right: Knee) - 160   Anesthesia type: Spinal   Pre-op diagnosis: Right knee osteoarthritis   Location: [facility] ROOM 07 / [facility]   Surgeons: Samson Frederic, MD       DISCUSSION: 67 year old never smoker with history of PONV, HTN, GERD, sleep apnea with CPAP, right knee OA scheduled for above procedure 12/20/2022 with Dr. Samson Frederic.  Patient last seen by cardiology 10/06/2022.  Per office visit note, "Preop evaluation: Prior to knee surgery.  Coronary CTA with nonobstructive CAD as above.  Echocardiogram with normal biventricular function and no significant valvular disease as above.   -She is low risk for an intermediate risk surgery.  No further cardiac workup recommended prior to surgery -Ok to hold ASA 7 days prior to surgery and resume post op per the surgeon instructions"  Anticipate pt can proceed with planned procedure barring acute status change.   VS: BP (!) 152/97   Pulse 74   Temp 36.8 C (Oral)   Resp 16   Ht 5\' 1"  (1.549 m)   Wt 83.5 kg   SpO2 98%   BMI 34.77 kg/m   PROVIDERS: Deatra James, MD is PCP  Cardiologist:  Little Ishikawa, MD  LABS: Labs reviewed: Acceptable for surgery. (all labs ordered are listed, but only abnormal results are displayed)  Labs Reviewed  CBC - Abnormal; Notable for the following components:      Result Value   RBC 5.20 (*)    MCH 25.2 (*)    All other components within normal limits  BASIC METABOLIC PANEL - Abnormal; Notable for the following components:   Glucose, Bld 110 (*)    All other components within normal limits  HEMOGLOBIN A1C - Abnormal; Notable for the following components:   Hgb A1c MFr Bld 6.1 (*)    All other components within normal limits  SURGICAL PCR SCREEN  GLUCOSE, CAPILLARY     IMAGES:   EKG:   CV: Echo 07/03/2022 1. Left ventricular ejection fraction, by estimation, is  55 to 60%. The  left ventricle has normal function. The left ventricle has no regional  wall motion abnormalities. Left ventricular diastolic parameters are  indeterminate. The average left  ventricular global longitudinal strain is -21.4 %. The global longitudinal  strain is normal.   2. Right ventricular systolic function is normal. The right ventricular  size is normal. There is normal pulmonary artery systolic pressure.   3. The mitral valve is normal in structure. Mild to moderate mitral valve  regurgitation. No evidence of mitral stenosis.   4. The aortic valve is normal in structure. Aortic valve regurgitation is  mild. Aortic valve sclerosis/calcification is present, without any  evidence of aortic stenosis.   5. There is mild dilatation of the ascending aorta, measuring 39 mm.   6. The inferior vena cava is normal in size with greater than 50%  respiratory variability, suggesting right atrial pressure of 3 mmHg.   Past Medical History:  Diagnosis Date   Arthritis    Dyspnea    with exertion   Family history of adverse reaction to anesthesia    mother- had problems with knee surgery but does not remember what   GERD (gastroesophageal reflux disease)    Heart disorder    Heart Muscle Spasms   Heart murmur    as a child   Hypertension    Hypothyroidism  Pancreatic insufficiency    PONV (postoperative nausea and vomiting)    Pre-diabetes    Sleep apnea    cpap    Past Surgical History:  Procedure Laterality Date   CESAREAN SECTION     ESOPHAGOGASTRODUODENOSCOPY (EGD) WITH PROPOFOL N/A 01/01/2013   Procedure: ESOPHAGOGASTRODUODENOSCOPY (EGD) WITH PROPOFOL;  Surgeon: Willis Modena, MD;  Location: [facility];  Service: Endoscopy;  Laterality: N/A;   EUS N/A 01/01/2013   Procedure: ESOPHAGEAL ENDOSCOPIC ULTRASOUND (EUS) RADIAL;  Surgeon: Willis Modena, MD;  Location: [facility];  Service: Endoscopy;  Laterality: N/A;   LEFT HEART CATH AND CORONARY ANGIOGRAPHY  2010    NASAL SINUS SURGERY     TUBAL LIGATION  2001    MEDICATIONS:  albuterol (VENTOLIN HFA) 108 (90 Base) MCG/ACT inhaler   amLODipine (NORVASC) 5 MG tablet   Ascorbic Acid (VITAMIN C) 1000 MG tablet   aspirin EC 81 MG tablet   atorvastatin (LIPITOR) 10 MG tablet   Azelastine HCl 137 MCG/SPRAY SOLN   Cholecalciferol (VITAMIN D3 MAXIMUM STRENGTH) 125 MCG (5000 UT) capsule   CREON 36000-114000 units CPEP capsule   famotidine (PEPCID) 10 MG tablet   fluticasone (FLONASE) 50 MCG/ACT nasal spray   levothyroxine (SYNTHROID, LEVOTHROID) 50 MCG tablet   losartan (COZAAR) 100 MG tablet   Multiple Vitamins-Minerals (MULTIVITAMIN WITH MINERALS) tablet   traZODone (DESYREL) 50 MG tablet   No current facility-administered medications for this encounter.   Jodell Cipro Ward, PA-C [facility] [phone]

## 2022-12-19 ENCOUNTER — Encounter (HOSPITAL_COMMUNITY): Payer: Self-pay | Admitting: Orthopedic Surgery

## 2022-12-19 ENCOUNTER — Ambulatory Visit: Payer: Self-pay | Admitting: Student

## 2022-12-19 NOTE — H&P (View-Only) (Signed)
TOTAL KNEE ADMISSION H&P  Patient is being admitted for right total knee arthroplasty.  Subjective:  Chief Complaint:right knee pain.  HPI: [name], 67 y.o. female, has a history of pain and functional disability in the right knee due to arthritis and has failed non-surgical conservative treatments for greater than 12 weeks to includeNSAID's and/or analgesics, corticosteriod injections, flexibility and strengthening excercises, use of assistive devices, and activity modification.  Onset of symptoms was gradual, starting 10 years ago with rapidlly worsening course since that time. The patient noted no past surgery on the right knee(s).  Patient currently rates pain in the right knee(s) at 10 out of 10 with activity. Patient has night pain, worsening of pain with activity and weight bearing, pain that interferes with activities of daily living, pain with passive range of motion, crepitus, and joint swelling.  Patient has evidence of subchondral cysts, subchondral sclerosis, periarticular osteophytes, and joint space narrowing by imaging studies. There is no active infection.  Patient Active Problem List   Diagnosis Date Noted   Severe obstructive sleep apnea 03/08/2022   Morbid obesity with BMI of 40.0-44.9, adult (HCC) 03/08/2022   Chest pain, precordial 09/07/2017   Shortness of breath 09/07/2017   Acute coronary syndrome (HCC) 09/07/2017   Past Medical History:  Diagnosis Date   Arthritis    Dyspnea    with exertion   Family history of adverse reaction to anesthesia    mother- had problems with knee surgery but does not remember what   GERD (gastroesophageal reflux disease)    Heart disorder    Heart Muscle Spasms   Heart murmur    as a child   Hypertension    Hypothyroidism    Pancreatic insufficiency    PONV (postoperative nausea and vomiting)    Pre-diabetes    Sleep apnea    cpap    Past Surgical History:  Procedure Laterality Date   CESAREAN SECTION      ESOPHAGOGASTRODUODENOSCOPY (EGD) WITH PROPOFOL N/A 01/01/2013   Procedure: ESOPHAGOGASTRODUODENOSCOPY (EGD) WITH PROPOFOL;  Surgeon: Willis Modena, MD;  [facility];  Service: Endoscopy;  Laterality: N/A;   EUS N/A 01/01/2013   Procedure: ESOPHAGEAL ENDOSCOPIC ULTRASOUND (EUS) RADIAL;  Surgeon: Willis Modena, MD;  [facility];  Service: Endoscopy;  Laterality: N/A;   LEFT HEART CATH AND CORONARY ANGIOGRAPHY  2010   NASAL SINUS SURGERY     TUBAL LIGATION  2001    Current Outpatient Medications  Medication Sig Dispense Refill Last Dose   albuterol (VENTOLIN HFA) 108 (90 Base) MCG/ACT inhaler Inhale 2 puffs into the lungs every 6 (six) hours as needed for wheezing or shortness of breath. 8 g 6    amLODipine (NORVASC) 5 MG tablet Take 5 mg by mouth daily before breakfast.       Ascorbic Acid (VITAMIN C) 1000 MG tablet Take 1,000 mg by mouth daily.      aspirin EC 81 MG tablet Take 81 mg by mouth daily.      atorvastatin (LIPITOR) 10 MG tablet Take 1 tablet (10 mg total) by mouth daily. (Patient taking differently: Take 10 mg by mouth every evening.) 90 tablet 3    Azelastine HCl 137 MCG/SPRAY SOLN Place 2 sprays into both nostrils every evening.      Cholecalciferol (VITAMIN D3 MAXIMUM STRENGTH) 125 MCG (5000 UT) capsule Take 5,000 Units by mouth daily.      CREON 36000-114000 units CPEP capsule Take 72,000 Units by mouth 3 (three) times daily with  meals.      famotidine (PEPCID) 10 MG tablet Take 10 mg by mouth daily as needed for heartburn.      fluticasone (FLONASE) 50 MCG/ACT nasal spray Place 2 sprays into both nostrils daily.      levothyroxine (SYNTHROID, LEVOTHROID) 50 MCG tablet Take 50 mcg by mouth daily before breakfast.      losartan (COZAAR) 100 MG tablet Take 100 mg by mouth daily before breakfast.       Multiple Vitamins-Minerals (MULTIVITAMIN WITH MINERALS) tablet Take 1 tablet by mouth daily.      traZODone (DESYREL) 50 MG tablet Take 1/2 to 1 whole tablet  by mouth at bedtime as needed for sleep. Do not drive after taking 90 tablet 2    No current facility-administered medications for this visit.   Allergies  Allergen Reactions   Tylenol [Acetaminophen] Other (See Comments)    Makes her feel jumpy.    Social History   Tobacco Use   Smoking status: Never   Smokeless tobacco: Never  Substance Use Topics   Alcohol use: Yes    Comment: occassionally    Family History  Problem Relation Age of Onset   Diabetes Father    Heart disease Father    Hyperlipidemia Father      Review of Systems  Musculoskeletal:  Positive for arthralgias and joint swelling.  All other systems reviewed and are negative.   Objective:  Physical Exam Constitutional:      Appearance: Normal appearance.  HENT:     Head: Normocephalic and atraumatic.     Nose: Nose normal.     Mouth/Throat:     Mouth: Mucous membranes are moist.     Pharynx: Oropharynx is clear.  Eyes:     Conjunctiva/sclera: Conjunctivae normal.  Cardiovascular:     Rate and Rhythm: Normal rate and regular rhythm.     Pulses: Normal pulses.     Heart sounds: Normal heart sounds.  Pulmonary:     Effort: Pulmonary effort is normal.     Breath sounds: Normal breath sounds.  Abdominal:     General: Abdomen is flat.     Palpations: Abdomen is soft.  Genitourinary:    Comments: deferred Musculoskeletal:     Cervical back: Normal range of motion and neck supple.     Comments: Examination of the right knee reveals no skin wounds or lesions. She has swelling, trace effusion. No warmth or erythema. Varus deformity. Tenderness to palpation medial joint line, lateral joint line, and peripatellar retinacular tissues with a positive grind sign. Range of motion is 8 to 105 degrees without any ligamentous instability. No extensor lag. Quad strength 4/5. Painless logrolling of the hip.  Distally, there is no focal motor or sensory deficit. She has palpable pedal pulses.  She has RLE edema at  baseline.   She ambulates with an antalgic gait.   Skin:    General: Skin is warm and dry.     Capillary Refill: Capillary refill takes less than 2 seconds.  Neurological:     General: No focal deficit present.     Mental Status: She is alert and oriented to person, place, and time.  Psychiatric:        Mood and Affect: Mood normal.        Behavior: Behavior normal.        Thought Content: Thought content normal.        Judgment: Judgment normal.     Vital signs in last 24 hours: @  VSRANGES@  Labs:   Estimated body mass index is 34.77 kg/m as calculated from the following:   Height as of 12/07/22: 5\' 1"  (1.549 m).   Weight as of 12/07/22: 83.5 kg.   Imaging Review Plain radiographs demonstrate severe degenerative joint disease of the right knee(s). The overall alignment issignificant varus. The bone quality appears to be adequate for age and reported activity level.      Assessment/Plan:  End stage arthritis, right knee   The patient history, physical examination, clinical judgment of the provider and imaging studies are consistent with end stage degenerative joint disease of the right knee(s) and total knee arthroplasty is deemed medically necessary. The treatment options including medical management, injection therapy arthroscopy and arthroplasty were discussed at length. The risks and benefits of total knee arthroplasty were presented and reviewed. The risks due to aseptic loosening, infection, stiffness, patella tracking problems, thromboembolic complications and other imponderables were discussed. The patient acknowledged the explanation, agreed to proceed with the plan and consent was signed. Patient is being admitted for inpatient treatment for surgery, pain control, PT, OT, prophylactic antibiotics, VTE prophylaxis, progressive ambulation and ADL's and discharge planning. The patient is planning to be discharged home with OPPT.   Therapy Plans: outpatient therapy.  Preop assessment 12/15/22 at [facility]. 1st PT at [facility] 12/25/22.  Disposition: Home with husband.  Planned DVT Prophylaxis: aspirin 81mg  BID.  DME needed: walker. Ice man today.  PCP: Cleared.  Cardiology: Cleared.  TXA: IV Allergies: NDKA.  Anesthesia Concerns: None.  BMI: 39.6 Last HgbA1c: 6.1 Other: - OSA, uses CPAP.  - Prediabetes - Aspirin 81 mg baseline.  - RLE edema at baseline. Recommend compressive devices prior to surgery.  - Oxycodone, zofran, methocarbamol, meloxicam.  - 12/07/22: Hgb 13.1, K+ 3.9, Cr. 0.67    Patient's anticipated LOS is less than 2 midnights, meeting these requirements: - Younger than 88 - Lives within 1 hour of care - Has a competent adult at home to recover with post-op recover - NO history of  - Chronic pain requiring opiods  - Diabetes  - Coronary Artery Disease  - Heart failure  - Heart attack  - Stroke  - DVT/VTE  - Cardiac arrhythmia  - Respiratory Failure/COPD  - Renal failure  - Anemia  - Advanced Liver disease

## 2022-12-19 NOTE — H&P (Signed)
TOTAL KNEE ADMISSION H&P  Patient is being admitted for right total knee arthroplasty.  Subjective:  Chief Complaint:right knee pain.  HPI: [name], 67 y.o. female, has a history of pain and functional disability in the right knee due to arthritis and has failed non-surgical conservative treatments for greater than 12 weeks to includeNSAID's and/or analgesics, corticosteriod injections, flexibility and strengthening excercises, use of assistive devices, and activity modification.  Onset of symptoms was gradual, starting 10 years ago with rapidlly worsening course since that time. The patient noted no past surgery on the right knee(s).  Patient currently rates pain in the right knee(s) at 10 out of 10 with activity. Patient has night pain, worsening of pain with activity and weight bearing, pain that interferes with activities of daily living, pain with passive range of motion, crepitus, and joint swelling.  Patient has evidence of subchondral cysts, subchondral sclerosis, periarticular osteophytes, and joint space narrowing by imaging studies. There is no active infection.  Patient Active Problem List   Diagnosis Date Noted   Severe obstructive sleep apnea 03/08/2022   Morbid obesity with BMI of 40.0-44.9, adult (HCC) 03/08/2022   Chest pain, precordial 09/07/2017   Shortness of breath 09/07/2017   Acute coronary syndrome (HCC) 09/07/2017   Past Medical History:  Diagnosis Date   Arthritis    Dyspnea    with exertion   Family history of adverse reaction to anesthesia    mother- had problems with knee surgery but does not remember what   GERD (gastroesophageal reflux disease)    Heart disorder    Heart Muscle Spasms   Heart murmur    as a child   Hypertension    Hypothyroidism    Pancreatic insufficiency    PONV (postoperative nausea and vomiting)    Pre-diabetes    Sleep apnea    cpap    Past Surgical History:  Procedure Laterality Date   CESAREAN SECTION      ESOPHAGOGASTRODUODENOSCOPY (EGD) WITH PROPOFOL N/A 01/01/2013   Procedure: ESOPHAGOGASTRODUODENOSCOPY (EGD) WITH PROPOFOL;  Surgeon: William Outlaw, MD;  [facility];  Service: Endoscopy;  Laterality: N/A;   EUS N/A 01/01/2013   Procedure: ESOPHAGEAL ENDOSCOPIC ULTRASOUND (EUS) RADIAL;  Surgeon: William Outlaw, MD;  [facility];  Service: Endoscopy;  Laterality: N/A;   LEFT HEART CATH AND CORONARY ANGIOGRAPHY  2010   NASAL SINUS SURGERY     TUBAL LIGATION  2001    Current Outpatient Medications  Medication Sig Dispense Refill Last Dose   albuterol (VENTOLIN HFA) 108 (90 Base) MCG/ACT inhaler Inhale 2 puffs into the lungs every 6 (six) hours as needed for wheezing or shortness of breath. 8 g 6    amLODipine (NORVASC) 5 MG tablet Take 5 mg by mouth daily before breakfast.       Ascorbic Acid (VITAMIN C) 1000 MG tablet Take 1,000 mg by mouth daily.      aspirin EC 81 MG tablet Take 81 mg by mouth daily.      atorvastatin (LIPITOR) 10 MG tablet Take 1 tablet (10 mg total) by mouth daily. (Patient taking differently: Take 10 mg by mouth every evening.) 90 tablet 3    Azelastine HCl 137 MCG/SPRAY SOLN Place 2 sprays into both nostrils every evening.      Cholecalciferol (VITAMIN D3 MAXIMUM STRENGTH) 125 MCG (5000 UT) capsule Take 5,000 Units by mouth daily.      CREON 36000-114000 units CPEP capsule Take 72,000 Units by mouth 3 (three) times daily with   meals.      famotidine (PEPCID) 10 MG tablet Take 10 mg by mouth daily as needed for heartburn.      fluticasone (FLONASE) 50 MCG/ACT nasal spray Place 2 sprays into both nostrils daily.      levothyroxine (SYNTHROID, LEVOTHROID) 50 MCG tablet Take 50 mcg by mouth daily before breakfast.      losartan (COZAAR) 100 MG tablet Take 100 mg by mouth daily before breakfast.       Multiple Vitamins-Minerals (MULTIVITAMIN WITH MINERALS) tablet Take 1 tablet by mouth daily.      traZODone (DESYREL) 50 MG tablet Take 1/2 to 1 whole tablet  by mouth at bedtime as needed for sleep. Do not drive after taking 90 tablet 2    No current facility-administered medications for this visit.   Allergies  Allergen Reactions   Tylenol [Acetaminophen] Other (See Comments)    Makes her feel jumpy.    Social History   Tobacco Use   Smoking status: Never   Smokeless tobacco: Never  Substance Use Topics   Alcohol use: Yes    Comment: occassionally    Family History  Problem Relation Age of Onset   Diabetes Father    Heart disease Father    Hyperlipidemia Father      Review of Systems  Musculoskeletal:  Positive for arthralgias and joint swelling.  All other systems reviewed and are negative.   Objective:  Physical Exam Constitutional:      Appearance: Normal appearance.  HENT:     Head: Normocephalic and atraumatic.     Nose: Nose normal.     Mouth/Throat:     Mouth: Mucous membranes are moist.     Pharynx: Oropharynx is clear.  Eyes:     Conjunctiva/sclera: Conjunctivae normal.  Cardiovascular:     Rate and Rhythm: Normal rate and regular rhythm.     Pulses: Normal pulses.     Heart sounds: Normal heart sounds.  Pulmonary:     Effort: Pulmonary effort is normal.     Breath sounds: Normal breath sounds.  Abdominal:     General: Abdomen is flat.     Palpations: Abdomen is soft.  Genitourinary:    Comments: deferred Musculoskeletal:     Cervical back: Normal range of motion and neck supple.     Comments: Examination of the right knee reveals no skin wounds or lesions. She has swelling, trace effusion. No warmth or erythema. Varus deformity. Tenderness to palpation medial joint line, lateral joint line, and peripatellar retinacular tissues with a positive grind sign. Range of motion is 8 to 105 degrees without any ligamentous instability. No extensor lag. Quad strength 4/5. Painless logrolling of the hip.  Distally, there is no focal motor or sensory deficit. She has palpable pedal pulses.  She has RLE edema at  baseline.   She ambulates with an antalgic gait.   Skin:    General: Skin is warm and dry.     Capillary Refill: Capillary refill takes less than 2 seconds.  Neurological:     General: No focal deficit present.     Mental Status: She is alert and oriented to person, place, and time.  Psychiatric:        Mood and Affect: Mood normal.        Behavior: Behavior normal.        Thought Content: Thought content normal.        Judgment: Judgment normal.     Vital signs in last 24 hours: @  VSRANGES@  Labs:   Estimated body mass index is 34.77 kg/m as calculated from the following:   Height as of 12/07/22: 5' 1" (1.549 m).   Weight as of 12/07/22: 83.5 kg.   Imaging Review Plain radiographs demonstrate severe degenerative joint disease of the right knee(s). The overall alignment issignificant varus. The bone quality appears to be adequate for age and reported activity level.      Assessment/Plan:  End stage arthritis, right knee   The patient history, physical examination, clinical judgment of the provider and imaging studies are consistent with end stage degenerative joint disease of the right knee(s) and total knee arthroplasty is deemed medically necessary. The treatment options including medical management, injection therapy arthroscopy and arthroplasty were discussed at length. The risks and benefits of total knee arthroplasty were presented and reviewed. The risks due to aseptic loosening, infection, stiffness, patella tracking problems, thromboembolic complications and other imponderables were discussed. The patient acknowledged the explanation, agreed to proceed with the plan and consent was signed. Patient is being admitted for inpatient treatment for surgery, pain control, PT, OT, prophylactic antibiotics, VTE prophylaxis, progressive ambulation and ADL's and discharge planning. The patient is planning to be discharged home with OPPT.   Therapy Plans: outpatient therapy.  Preop assessment 12/15/22 at [facility]. 1st PT at [facility] 12/25/22.  Disposition: Home with husband.  Planned DVT Prophylaxis: aspirin 81mg BID.  DME needed: walker. Ice man today.  PCP: Cleared.  Cardiology: Cleared.  TXA: IV Allergies: NDKA.  Anesthesia Concerns: None.  BMI: 39.6 Last HgbA1c: 6.1 Other: - OSA, uses CPAP.  - Prediabetes - Aspirin 81 mg baseline.  - RLE edema at baseline. Recommend compressive devices prior to surgery.  - Oxycodone, zofran, methocarbamol, meloxicam.  - 12/07/22: Hgb 13.1, K+ 3.9, Cr. 0.67    Patient's anticipated LOS is less than 2 midnights, meeting these requirements: - Younger than 65 - Lives within 1 hour of care - Has a competent adult at home to recover with post-op recover - NO history of  - Chronic pain requiring opiods  - Diabetes  - Coronary Artery Disease  - Heart failure  - Heart attack  - Stroke  - DVT/VTE  - Cardiac arrhythmia  - Respiratory Failure/COPD  - Renal failure  - Anemia  - Advanced Liver disease   

## 2022-12-20 ENCOUNTER — Encounter (HOSPITAL_COMMUNITY): Payer: Self-pay | Admitting: Orthopedic Surgery

## 2022-12-20 ENCOUNTER — Other Ambulatory Visit (HOSPITAL_COMMUNITY): Payer: Self-pay

## 2022-12-20 ENCOUNTER — Ambulatory Visit (HOSPITAL_COMMUNITY): Payer: Medicare Other | Admitting: Physician Assistant

## 2022-12-20 ENCOUNTER — Other Ambulatory Visit: Payer: Self-pay

## 2022-12-20 ENCOUNTER — Observation Stay (HOSPITAL_COMMUNITY)
Admission: RE | Admit: 2022-12-20 | Discharge: 2022-12-21 | Disposition: A | Discharge status: Home / Self Care | Payer: Medicare Other | Source: Ambulatory Visit | Attending: Orthopedic Surgery | Admitting: Orthopedic Surgery

## 2022-12-20 ENCOUNTER — Other Ambulatory Visit (HOSPITAL_BASED_OUTPATIENT_CLINIC_OR_DEPARTMENT_OTHER): Payer: Self-pay

## 2022-12-20 ENCOUNTER — Ambulatory Visit (HOSPITAL_COMMUNITY): Payer: Medicare Other

## 2022-12-20 ENCOUNTER — Encounter (HOSPITAL_COMMUNITY)
Admission: RE | Disposition: A | Discharge status: Home / Self Care | Payer: Self-pay | Source: Ambulatory Visit | Attending: Orthopedic Surgery

## 2022-12-20 ENCOUNTER — Ambulatory Visit (HOSPITAL_BASED_OUTPATIENT_CLINIC_OR_DEPARTMENT_OTHER): Payer: Medicare Other | Admitting: Anesthesiology

## 2022-12-20 DIAGNOSIS — E039 Hypothyroidism, unspecified: Secondary | ICD-10-CM | POA: Insufficient documentation

## 2022-12-20 DIAGNOSIS — Z9989 Dependence on other enabling machines and devices: Secondary | ICD-10-CM

## 2022-12-20 DIAGNOSIS — Z79899 Other long term (current) drug therapy: Secondary | ICD-10-CM | POA: Diagnosis not present

## 2022-12-20 DIAGNOSIS — G4733 Obstructive sleep apnea (adult) (pediatric): Secondary | ICD-10-CM

## 2022-12-20 DIAGNOSIS — Z01818 Encounter for other preprocedural examination: Secondary | ICD-10-CM

## 2022-12-20 DIAGNOSIS — I10 Essential (primary) hypertension: Secondary | ICD-10-CM | POA: Insufficient documentation

## 2022-12-20 DIAGNOSIS — M1711 Unilateral primary osteoarthritis, right knee: Principal | ICD-10-CM | POA: Insufficient documentation

## 2022-12-20 DIAGNOSIS — Z7982 Long term (current) use of aspirin: Secondary | ICD-10-CM | POA: Diagnosis not present

## 2022-12-20 DIAGNOSIS — Z96651 Presence of right artificial knee joint: Secondary | ICD-10-CM

## 2022-12-20 HISTORY — PX: KNEE ARTHROPLASTY: SHX992

## 2022-12-20 LAB — GLUCOSE, CAPILLARY: Glucose-Capillary: 170 mg/dL — ABNORMAL HIGH (ref 70–99)

## 2022-12-20 SURGERY — ARTHROPLASTY, KNEE, TOTAL, USING IMAGELESS COMPUTER-ASSISTED NAVIGATION
Anesthesia: Monitor Anesthesia Care | Site: Knee | Laterality: Right

## 2022-12-20 MED ORDER — SENNA 8.6 MG PO TABS
1.0000 | ORAL_TABLET | Freq: Two times a day (BID) | ORAL | Status: DC
  Administered 2022-12-20 – 2022-12-21 (×2): 8.6 mg via ORAL
  Filled 2022-12-20 (×2): qty 1

## 2022-12-20 MED ORDER — DOCUSATE SODIUM 100 MG PO CAPS: 100.0000 mg | ORAL_CAPSULE | Freq: Two times a day (BID) | ORAL | 0 refills | Status: DC

## 2022-12-20 MED ORDER — PHENYLEPHRINE HCL-NACL 20-0.9 MG/250ML-% IV SOLN
INTRAVENOUS | Status: DC | PRN
  Administered 2022-12-20: 25 ug/min via INTRAVENOUS

## 2022-12-20 MED ORDER — EPINEPHRINE PF 1 MG/ML IJ SOLN
INTRAMUSCULAR | Status: AC
  Filled 2022-12-20: qty 1

## 2022-12-20 MED ORDER — ONDANSETRON HCL 4 MG/2ML IJ SOLN: 4.0000 mg | Freq: Four times a day (QID) | INTRAMUSCULAR | Status: DC | PRN

## 2022-12-20 MED ORDER — KETOROLAC TROMETHAMINE 15 MG/ML IJ SOLN
15.0000 mg | Freq: Four times a day (QID) | INTRAMUSCULAR | Status: DC
  Administered 2022-12-20: 15 mg via INTRAVENOUS
  Filled 2022-12-20 (×2): qty 1

## 2022-12-20 MED ORDER — MIDAZOLAM HCL 2 MG/2ML IJ SOLN
INTRAMUSCULAR | Status: AC
  Filled 2022-12-20: qty 2

## 2022-12-20 MED ORDER — ONDANSETRON HCL 4 MG PO TABS
4.0000 mg | ORAL_TABLET | Freq: Three times a day (TID) | ORAL | 0 refills | Status: DC | PRN
  Filled 2022-12-20 (×2): qty 30, 10d supply, fill #0

## 2022-12-20 MED ORDER — SENNA 8.6 MG PO TABS
2.0000 | ORAL_TABLET | Freq: Every day | ORAL | 0 refills | Status: AC
  Filled 2022-12-20: qty 30, 15d supply, fill #0

## 2022-12-20 MED ORDER — FENTANYL CITRATE PF 50 MCG/ML IJ SOSY
PREFILLED_SYRINGE | INTRAMUSCULAR | Status: AC
  Filled 2022-12-20: qty 2

## 2022-12-20 MED ORDER — BUPIVACAINE-EPINEPHRINE (PF) 0.5% -1:200000 IJ SOLN
INTRAMUSCULAR | Status: DC | PRN
  Administered 2022-12-20: 30 mL

## 2022-12-20 MED ORDER — PROMETHAZINE HCL 25 MG/ML IJ SOLN: 6.2500 mg | INTRAMUSCULAR | Status: DC | PRN

## 2022-12-20 MED ORDER — KETAMINE HCL 10 MG/ML IJ SOLN
INTRAMUSCULAR | Status: AC
  Filled 2022-12-20: qty 1

## 2022-12-20 MED ORDER — SODIUM CHLORIDE 0.9 % IR SOLN
Status: DC | PRN
  Administered 2022-12-20: 3000 mL
  Administered 2022-12-20: 1000 mL

## 2022-12-20 MED ORDER — OXYCODONE HCL 5 MG PO TABS: 10.0000 mg | ORAL_TABLET | ORAL | Status: DC | PRN

## 2022-12-20 MED ORDER — DOCUSATE SODIUM 100 MG PO CAPS
100.0000 mg | ORAL_CAPSULE | Freq: Two times a day (BID) | ORAL | Status: DC
  Administered 2022-12-20 – 2022-12-21 (×2): 100 mg via ORAL
  Filled 2022-12-20 (×2): qty 1

## 2022-12-20 MED ORDER — OXYCODONE HCL 5 MG PO TABS
5.0000 mg | ORAL_TABLET | ORAL | Status: DC | PRN
  Administered 2022-12-20 – 2022-12-21 (×2): 5 mg via ORAL
  Filled 2022-12-20 (×2): qty 1

## 2022-12-20 MED ORDER — CEFAZOLIN SODIUM-DEXTROSE 2-4 GM/100ML-% IV SOLN
2.0000 g | Freq: Four times a day (QID) | INTRAVENOUS | Status: AC
  Administered 2022-12-20: 2 g via INTRAVENOUS
  Filled 2022-12-20: qty 100

## 2022-12-20 MED ORDER — ALUM & MAG HYDROXIDE-SIMETH 200-200-20 MG/5ML PO SUSP: 30.0000 mL | ORAL | Status: DC | PRN

## 2022-12-20 MED ORDER — LACTATED RINGERS IV SOLN: INTRAVENOUS | Status: DC

## 2022-12-20 MED ORDER — KETAMINE HCL 10 MG/ML IJ SOLN
INTRAMUSCULAR | Status: DC | PRN
  Administered 2022-12-20: 20 mg via INTRAVENOUS
  Administered 2022-12-20: 10 mg via INTRAVENOUS

## 2022-12-20 MED ORDER — ORAL CARE MOUTH RINSE: 15.0000 mL | Freq: Once | OROMUCOSAL | Status: AC

## 2022-12-20 MED ORDER — ASPIRIN 81 MG PO CHEW
81.0000 mg | CHEWABLE_TABLET | Freq: Two times a day (BID) | ORAL | Status: DC
  Administered 2022-12-20 – 2022-12-21 (×2): 81 mg via ORAL
  Filled 2022-12-20 (×2): qty 1

## 2022-12-20 MED ORDER — SODIUM CHLORIDE (PF) 0.9 % IJ SOLN
INTRAMUSCULAR | Status: DC | PRN
  Administered 2022-12-20: 30 mL

## 2022-12-20 MED ORDER — AMISULPRIDE (ANTIEMETIC) 5 MG/2ML IV SOLN
INTRAVENOUS | Status: AC
  Filled 2022-12-20: qty 4

## 2022-12-20 MED ORDER — AMISULPRIDE (ANTIEMETIC) 5 MG/2ML IV SOLN
10.0000 mg | Freq: Once | INTRAVENOUS | Status: AC | PRN
  Administered 2022-12-20: 10 mg via INTRAVENOUS

## 2022-12-20 MED ORDER — PANTOPRAZOLE SODIUM 40 MG PO TBEC
40.0000 mg | DELAYED_RELEASE_TABLET | Freq: Every day | ORAL | Status: DC
  Administered 2022-12-20 – 2022-12-21 (×2): 40 mg via ORAL
  Filled 2022-12-20 (×2): qty 1

## 2022-12-20 MED ORDER — METHOCARBAMOL 500 MG IVPB - SIMPLE MED
INTRAVENOUS | Status: AC
  Filled 2022-12-20: qty 55

## 2022-12-20 MED ORDER — ACETAMINOPHEN 325 MG PO TABS: 325.0000 mg | ORAL_TABLET | Freq: Four times a day (QID) | ORAL | Status: DC | PRN

## 2022-12-20 MED ORDER — BISACODYL 10 MG RE SUPP: 10.0000 mg | Freq: Every day | RECTAL | Status: DC | PRN

## 2022-12-20 MED ORDER — KETOROLAC TROMETHAMINE 30 MG/ML IJ SOLN
INTRAMUSCULAR | Status: AC
  Filled 2022-12-20: qty 1

## 2022-12-20 MED ORDER — SENNA 8.6 MG PO TABS: 2.0000 | ORAL_TABLET | Freq: Every day | ORAL | 0 refills | Status: DC

## 2022-12-20 MED ORDER — FENTANYL CITRATE (PF) 100 MCG/2ML IJ SOLN
INTRAMUSCULAR | Status: AC
  Filled 2022-12-20: qty 2

## 2022-12-20 MED ORDER — MELOXICAM 15 MG PO TABS: 15.0000 mg | ORAL_TABLET | Freq: Every day | ORAL | 2 refills | Status: DC

## 2022-12-20 MED ORDER — PROPOFOL 10 MG/ML IV BOLUS
INTRAVENOUS | Status: AC
  Filled 2022-12-20: qty 20

## 2022-12-20 MED ORDER — KETOROLAC TROMETHAMINE 30 MG/ML IJ SOLN
INTRAMUSCULAR | Status: DC | PRN
  Administered 2022-12-20: 30 mg via INTRAMUSCULAR

## 2022-12-20 MED ORDER — CEFAZOLIN SODIUM-DEXTROSE 2-4 GM/100ML-% IV SOLN
2.0000 g | INTRAVENOUS | Status: AC
  Administered 2022-12-20: 2 g via INTRAVENOUS
  Filled 2022-12-20: qty 100

## 2022-12-20 MED ORDER — POLYETHYLENE GLYCOL 3350 17 G PO PACK: 17.0000 g | PACK | Freq: Every day | ORAL | 0 refills | Status: DC | PRN

## 2022-12-20 MED ORDER — MENTHOL 3 MG MT LOZG: 1.0000 | LOZENGE | OROMUCOSAL | Status: DC | PRN

## 2022-12-20 MED ORDER — MIDAZOLAM HCL 5 MG/5ML IJ SOLN
INTRAMUSCULAR | Status: DC | PRN
  Administered 2022-12-20 (×2): 1 mg via INTRAVENOUS

## 2022-12-20 MED ORDER — ACETAMINOPHEN 500 MG PO TABS
1000.0000 mg | ORAL_TABLET | Freq: Once | ORAL | Status: DC
  Filled 2022-12-20: qty 2

## 2022-12-20 MED ORDER — BUPIVACAINE HCL (PF) 0.5 % IJ SOLN
INTRAMUSCULAR | Status: AC
  Filled 2022-12-20: qty 30

## 2022-12-20 MED ORDER — FENTANYL CITRATE (PF) 100 MCG/2ML IJ SOLN
INTRAMUSCULAR | Status: DC | PRN
  Administered 2022-12-20 (×2): 50 ug via INTRAVENOUS

## 2022-12-20 MED ORDER — POLYETHYLENE GLYCOL 3350 17 G PO PACK
17.0000 g | PACK | Freq: Every day | ORAL | 0 refills | Status: AC | PRN
  Filled 2022-12-20: qty 14, 14d supply, fill #0

## 2022-12-20 MED ORDER — ONDANSETRON HCL 4 MG/2ML IJ SOLN
INTRAMUSCULAR | Status: DC | PRN
  Administered 2022-12-20: 4 mg via INTRAVENOUS

## 2022-12-20 MED ORDER — METHOCARBAMOL 500 MG PO TABS
500.0000 mg | ORAL_TABLET | Freq: Four times a day (QID) | ORAL | 0 refills | Status: DC | PRN
  Filled 2022-12-20 (×2): qty 20, 5d supply, fill #0

## 2022-12-20 MED ORDER — ASPIRIN 81 MG PO CHEW: 81.0000 mg | CHEWABLE_TABLET | Freq: Two times a day (BID) | ORAL | 0 refills | Status: DC

## 2022-12-20 MED ORDER — ASPIRIN 81 MG PO CHEW
81.0000 mg | CHEWABLE_TABLET | Freq: Two times a day (BID) | ORAL | 0 refills | Status: AC
  Filled 2022-12-20: qty 90, 45d supply, fill #0

## 2022-12-20 MED ORDER — DEXMEDETOMIDINE HCL IN NACL 80 MCG/20ML IV SOLN
INTRAVENOUS | Status: DC | PRN
  Administered 2022-12-20: 4 ug via INTRAVENOUS
  Administered 2022-12-20 (×2): 8 ug via INTRAVENOUS

## 2022-12-20 MED ORDER — PHENOL 1.4 % MT LIQD: 1.0000 | OROMUCOSAL | Status: DC | PRN

## 2022-12-20 MED ORDER — CHLORHEXIDINE GLUCONATE 0.12 % MT SOLN
15.0000 mL | Freq: Once | OROMUCOSAL | Status: AC
  Administered 2022-12-20: 15 mL via OROMUCOSAL

## 2022-12-20 MED ORDER — SODIUM CHLORIDE (PF) 0.9 % IJ SOLN
INTRAMUSCULAR | Status: AC
  Filled 2022-12-20: qty 30

## 2022-12-20 MED ORDER — ACETAMINOPHEN 500 MG PO TABS: 1000.0000 mg | ORAL_TABLET | Freq: Once | ORAL | Status: DC

## 2022-12-20 MED ORDER — METHOCARBAMOL 500 MG PO TABS: 500.0000 mg | ORAL_TABLET | Freq: Four times a day (QID) | ORAL | Status: DC | PRN

## 2022-12-20 MED ORDER — SODIUM CHLORIDE 0.9 % IV SOLN: INTRAVENOUS | Status: DC

## 2022-12-20 MED ORDER — LACTATED RINGERS IV BOLUS
500.0000 mL | Freq: Once | INTRAVENOUS | Status: AC
  Administered 2022-12-20: 500 mL via INTRAVENOUS

## 2022-12-20 MED ORDER — DOCUSATE SODIUM 100 MG PO CAPS
100.0000 mg | ORAL_CAPSULE | Freq: Two times a day (BID) | ORAL | 0 refills | Status: AC
  Filled 2022-12-20: qty 60, 30d supply, fill #0

## 2022-12-20 MED ORDER — METHOCARBAMOL 500 MG IVPB - SIMPLE MED
500.0000 mg | Freq: Four times a day (QID) | INTRAVENOUS | Status: DC | PRN
  Administered 2022-12-20: 500 mg via INTRAVENOUS

## 2022-12-20 MED ORDER — DEXMEDETOMIDINE HCL IN NACL 80 MCG/20ML IV SOLN
INTRAVENOUS | Status: AC
  Filled 2022-12-20: qty 20

## 2022-12-20 MED ORDER — METOCLOPRAMIDE HCL 5 MG PO TABS: 5.0000 mg | ORAL_TABLET | Freq: Three times a day (TID) | ORAL | Status: DC | PRN

## 2022-12-20 MED ORDER — OXYCODONE HCL 5 MG PO TABS
5.0000 mg | ORAL_TABLET | ORAL | 0 refills | Status: AC | PRN
  Filled 2022-12-20 (×2): qty 42, 7d supply, fill #0

## 2022-12-20 MED ORDER — DEXAMETHASONE SODIUM PHOSPHATE 10 MG/ML IJ SOLN
INTRAMUSCULAR | Status: DC | PRN
  Administered 2022-12-20: 8 mg via INTRAVENOUS

## 2022-12-20 MED ORDER — POVIDONE-IODINE 10 % EX SWAB
2.0000 | Freq: Once | CUTANEOUS | Status: AC
  Administered 2022-12-20: 2 via TOPICAL

## 2022-12-20 MED ORDER — OXYCODONE HCL 5 MG PO TABS: 5.0000 mg | ORAL_TABLET | ORAL | 0 refills | Status: DC | PRN

## 2022-12-20 MED ORDER — METOCLOPRAMIDE HCL 5 MG/ML IJ SOLN: 5.0000 mg | Freq: Three times a day (TID) | INTRAMUSCULAR | Status: DC | PRN

## 2022-12-20 MED ORDER — DIPHENHYDRAMINE HCL 12.5 MG/5ML PO ELIX: 12.5000 mg | ORAL_SOLUTION | ORAL | Status: DC | PRN

## 2022-12-20 MED ORDER — MELOXICAM 15 MG PO TABS
15.0000 mg | ORAL_TABLET | Freq: Every day | ORAL | 2 refills | Status: DC
  Filled 2022-12-20 (×2): qty 30, 30d supply, fill #0

## 2022-12-20 MED ORDER — PROPOFOL 500 MG/50ML IV EMUL
INTRAVENOUS | Status: DC | PRN
  Administered 2022-12-20: 75 ug/kg/min via INTRAVENOUS

## 2022-12-20 MED ORDER — ACETAMINOPHEN 500 MG PO TABS
1000.0000 mg | ORAL_TABLET | Freq: Four times a day (QID) | ORAL | Status: DC
  Filled 2022-12-20: qty 2

## 2022-12-20 MED ORDER — ONDANSETRON HCL 4 MG PO TABS: 4.0000 mg | ORAL_TABLET | Freq: Three times a day (TID) | ORAL | 0 refills | Status: DC | PRN

## 2022-12-20 MED ORDER — HYDROMORPHONE HCL 1 MG/ML IJ SOLN
INTRAMUSCULAR | Status: AC
  Administered 2022-12-20: 1 mg via INTRAVENOUS
  Filled 2022-12-20: qty 1

## 2022-12-20 MED ORDER — POVIDONE-IODINE 10 % EX SWAB: 2.0000 | Freq: Once | CUTANEOUS | Status: DC

## 2022-12-20 MED ORDER — LACTATED RINGERS IV BOLUS
250.0000 mL | Freq: Once | INTRAVENOUS | Status: AC
  Administered 2022-12-20: 250 mL via INTRAVENOUS

## 2022-12-20 MED ORDER — ISOPROPYL ALCOHOL 70 % SOLN
Status: DC | PRN
  Administered 2022-12-20: 1 via TOPICAL

## 2022-12-20 MED ORDER — FENTANYL CITRATE PF 50 MCG/ML IJ SOSY
25.0000 ug | PREFILLED_SYRINGE | INTRAMUSCULAR | Status: DC | PRN
  Administered 2022-12-20 (×2): 50 ug via INTRAVENOUS

## 2022-12-20 MED ORDER — POLYETHYLENE GLYCOL 3350 17 G PO PACK: 17.0000 g | PACK | Freq: Every day | ORAL | Status: DC | PRN

## 2022-12-20 MED ORDER — TRANEXAMIC ACID-NACL 1000-0.7 MG/100ML-% IV SOLN
1000.0000 mg | INTRAVENOUS | Status: AC
  Administered 2022-12-20: 1000 mg via INTRAVENOUS
  Filled 2022-12-20: qty 100

## 2022-12-20 MED ORDER — METHOCARBAMOL 500 MG PO TABS: 500.0000 mg | ORAL_TABLET | Freq: Four times a day (QID) | ORAL | 0 refills | Status: DC | PRN

## 2022-12-20 MED ORDER — DEXAMETHASONE SODIUM PHOSPHATE 10 MG/ML IJ SOLN
INTRAMUSCULAR | Status: DC | PRN
  Administered 2022-12-20: 5 mg

## 2022-12-20 MED ORDER — BUPIVACAINE IN DEXTROSE 0.75-8.25 % IT SOLN
INTRATHECAL | Status: DC | PRN
  Administered 2022-12-20: 1.8 mL via INTRATHECAL

## 2022-12-20 MED ORDER — ROPIVACAINE HCL 7.5 MG/ML IJ SOLN
INTRAMUSCULAR | Status: DC | PRN
  Administered 2022-12-20: 20 mL via PERINEURAL

## 2022-12-20 MED ORDER — LIDOCAINE 2% (20 MG/ML) 5 ML SYRINGE
INTRAMUSCULAR | Status: DC | PRN
  Administered 2022-12-20: 80 mg via INTRAVENOUS

## 2022-12-20 MED ORDER — PROPOFOL 10 MG/ML IV BOLUS
INTRAVENOUS | Status: DC | PRN
  Administered 2022-12-20: 30 mg via INTRAVENOUS

## 2022-12-20 MED ORDER — ONDANSETRON HCL 4 MG PO TABS: 4.0000 mg | ORAL_TABLET | Freq: Four times a day (QID) | ORAL | Status: DC | PRN

## 2022-12-20 MED ORDER — HYDROMORPHONE HCL 1 MG/ML IJ SOLN: 0.5000 mg | INTRAMUSCULAR | Status: DC | PRN

## 2022-12-20 SURGICAL SUPPLY — 74 items
ADH SKN CLS APL DERMABOND .7 (GAUZE/BANDAGES/DRESSINGS) ×2
APL PRP STRL LF DISP 70% ISPRP (MISCELLANEOUS) ×2
BAG COUNTER SPONGE SURGICOUNT (BAG) IMPLANT
BAG SPEC THK2 15X12 ZIP CLS (MISCELLANEOUS)
BAG SPNG CNTER NS LX DISP (BAG)
BAG ZIPLOCK 12X15 (MISCELLANEOUS) IMPLANT
BATTERY INSTRU NAVIGATION (MISCELLANEOUS) ×6 IMPLANT
BLADE SAW RECIPROCATING 77.5 (BLADE) ×2 IMPLANT
BNDG CMPR 5X4 KNIT ELC UNQ LF (GAUZE/BANDAGES/DRESSINGS) ×1
BNDG CMPR 5X62 HK CLSR LF (GAUZE/BANDAGES/DRESSINGS) ×1
BNDG CMPR MED 10X6 ELC LF (GAUZE/BANDAGES/DRESSINGS) ×1
BNDG ELASTIC 4INX 5YD STR LF (GAUZE/BANDAGES/DRESSINGS) ×2 IMPLANT
BNDG ELASTIC 6INX 5YD STR LF (GAUZE/BANDAGES/DRESSINGS) ×2 IMPLANT
BNDG ELASTIC 6X10 VLCR STRL LF (GAUZE/BANDAGES/DRESSINGS) IMPLANT
BTRY SRG DRVR LF (MISCELLANEOUS) ×3
CHLORAPREP W/TINT 26 (MISCELLANEOUS) ×4 IMPLANT
COMP FEM STD PS KNEE 7 RT (Joint) ×1 IMPLANT
COMP TIB PS KNEE 0D E RT (Joint) ×1 IMPLANT
COMPONENT FEM STD PS KNEE 7 RT (Joint) IMPLANT
COMPONET TIB PS KNEE 0D E RT (Joint) IMPLANT
COVER SURGICAL LIGHT HANDLE (MISCELLANEOUS) ×2 IMPLANT
DERMABOND ADVANCED .7 DNX12 (GAUZE/BANDAGES/DRESSINGS) ×4 IMPLANT
DRAPE SHEET LG 3/4 BI-LAMINATE (DRAPES) ×6 IMPLANT
DRAPE U-SHAPE 47X51 STRL (DRAPES) ×2 IMPLANT
DRSG AQUACEL AG ADV 3.5X10 (GAUZE/BANDAGES/DRESSINGS) ×2 IMPLANT
ELECT BLADE TIP CTD 4 INCH (ELECTRODE) ×2 IMPLANT
ELECT REM PT RETURN 15FT ADLT (MISCELLANEOUS) ×2 IMPLANT
GAUZE SPONGE 4X4 12PLY STRL (GAUZE/BANDAGES/DRESSINGS) ×2 IMPLANT
GLOVE BIO SURGEON STRL SZ7 (GLOVE) ×2 IMPLANT
GLOVE BIO SURGEON STRL SZ8.5 (GLOVE) ×4 IMPLANT
GLOVE BIOGEL PI IND STRL 7.5 (GLOVE) ×2 IMPLANT
GLOVE BIOGEL PI IND STRL 8.5 (GLOVE) ×2 IMPLANT
GOWN SPEC L3 XXLG W/TWL (GOWN DISPOSABLE) ×2 IMPLANT
GOWN STRL REUS W/ TWL XL LVL3 (GOWN DISPOSABLE) ×2 IMPLANT
GOWN STRL REUS W/TWL XL LVL3 (GOWN DISPOSABLE) ×1
HANDPIECE INTERPULSE COAX TIP (DISPOSABLE) ×1
HDLS TROCR DRIL PIN KNEE 75 (PIN) ×2
HOLDER FOLEY CATH W/STRAP (MISCELLANEOUS) ×2 IMPLANT
HOOD PEEL AWAY T7 (MISCELLANEOUS) ×6 IMPLANT
IMPL PATELLA METAL SZ32X10 (Joint) IMPLANT
INSERT TIB KNEE EF/3-11 12 RT (Insert) IMPLANT
KIT TURNOVER KIT A (KITS) IMPLANT
MARKER SKIN DUAL TIP RULER LAB (MISCELLANEOUS) ×2 IMPLANT
NDL SAFETY ECLIP 18X1.5 (MISCELLANEOUS) ×2 IMPLANT
NDL SPNL 18GX3.5 QUINCKE PK (NEEDLE) ×2 IMPLANT
NEEDLE SPNL 18GX3.5 QUINCKE PK (NEEDLE) ×1 IMPLANT
NS IRRIG 1000ML POUR BTL (IV SOLUTION) ×2 IMPLANT
PACK TOTAL KNEE CUSTOM (KITS) ×2 IMPLANT
PADDING CAST ABS COTTON 6X4 NS (CAST SUPPLIES) IMPLANT
PADDING CAST COTTON 6X4 STRL (CAST SUPPLIES) ×2 IMPLANT
PIN DRILL HDLS TROCAR 75 4PK (PIN) IMPLANT
PROTECTOR NERVE ULNAR (MISCELLANEOUS) ×2 IMPLANT
SAW OSC TIP CART 19.5X105X1.3 (SAW) ×2 IMPLANT
SCREW FEMALE HEX FIX 25X2.5 (ORTHOPEDIC DISPOSABLE SUPPLIES) IMPLANT
SEALER BIPOLAR AQUA 6.0 (INSTRUMENTS) ×2 IMPLANT
SET HNDPC FAN SPRY TIP SCT (DISPOSABLE) ×2 IMPLANT
SET PAD KNEE POSITIONER (MISCELLANEOUS) ×2 IMPLANT
SOLUTION PRONTOSAN WOUND 350ML (IRRIGATION / IRRIGATOR) IMPLANT
SPIKE FLUID TRANSFER (MISCELLANEOUS) ×4 IMPLANT
SUT MNCRL AB 3-0 PS2 18 (SUTURE) ×2 IMPLANT
SUT MNCRL AB 4-0 PS2 18 (SUTURE) IMPLANT
SUT MON AB 2-0 CT1 36 (SUTURE) ×2 IMPLANT
SUT STRATAFIX PDO 1 14 VIOLET (SUTURE) ×1
SUT STRATFX PDO 1 14 VIOLET (SUTURE) ×1
SUT VIC AB 1 CTX 36 (SUTURE) ×2
SUT VIC AB 1 CTX36XBRD ANBCTR (SUTURE) ×4 IMPLANT
SUT VIC AB 2-0 CT1 27 (SUTURE) ×1
SUT VIC AB 2-0 CT1 TAPERPNT 27 (SUTURE) ×2 IMPLANT
SUTURE STRATFX PDO 1 14 VIOLET (SUTURE) ×2 IMPLANT
SYR 3ML LL SCALE MARK (SYRINGE) ×2 IMPLANT
TRAY FOLEY MTR SLVR 16FR STAT (SET/KITS/TRAYS/PACK) IMPLANT
TUBE SUCTION HIGH CAP CLEAR NV (SUCTIONS) ×2 IMPLANT
WATER STERILE IRR 1000ML POUR (IV SOLUTION) ×4 IMPLANT
WRAP KNEE MAXI GEL POST OP (GAUZE/BANDAGES/DRESSINGS) IMPLANT

## 2022-12-20 NOTE — Op Note (Signed)
OPERATIVE REPORT  SURGEON: Samson Frederic, MD   ASSISTANT: Clint Bolder, PA-C  PREOPERATIVE DIAGNOSIS: Primary Right knee arthritis.   POSTOPERATIVE DIAGNOSIS: Primary Right knee arthritis.   PROCEDURE: Computer assisted Right total knee arthroplasty.   IMPLANTS: Zimmer Persona PPS Cementless CR femur, size 7. Persona 0 degree Spiked Keel OsseoTi Tibia, size E. Vivacit-E polyethelyene insert, size 12 mm, CR. TM standard patella, size 32 mm.  ANESTHESIA:  MAC, Regional, and Spinal  TOURNIQUET TIME: Not utilized.   ESTIMATED BLOOD LOSS:-300 mL    ANTIBIOTICS: 2 g Ancef.  DRAINS: None.  COMPLICATIONS: None   CONDITION: PACU - hemodynamically stable.   BRIEF CLINICAL NOTE: [name] is a 67 y.o. female with a long-standing history of Right knee arthritis. After failing conservative management, the patient was indicated for total knee arthroplasty. The risks, benefits, and alternatives to the procedure were explained, and the patient elected to proceed.  PROCEDURE IN DETAIL: Adductor canal block was obtained in the pre-op holding area. Once inside the operative room, spinal anesthesia was obtained, and a foley catheter was inserted. The patient was then positioned and the lower extremity was prepped and draped in the normal sterile surgical fashion.  A time-out was called verifying side and site of surgery. The patient received IV antibiotics within 60 minutes of beginning the procedure. A tourniquet was not utilized.   An anterior approach to the knee was performed utilizing a midvastus arthrotomy. A medial release was performed and the patellar fat pad was excised. Stryker imageless navigation was used to cut the distal femur perpendicular to the mechanical axis. A freehand patellar resection was performed, and the patella was sized an prepared with a lug hole.  Nagivation was used to make a neutral proximal tibia resection, taking 9 mm of bone from the less affected  lateral side with 3 degrees of slope. The menisci were excised. A spacer block was placed, and the alignment and balance in extension were confirmed.   The distal femur was sized using the 3-degree external rotation guide referencing the posterior femoral cortex. The appropriate 4-in-1 cutting block was pinned into place. Rotation was checked using Whiteside's line, the epicondylar axis, and then confirmed with a spacer block in flexion. The remaining femoral cuts were performed, taking care to protect the MCL.  The tibia was sized and the trial tray was pinned into place. The remaining trail components were inserted. The knee was stable to varus and valgus stress through a full range of motion. The patella tracked centrally, and the PCL was well balanced. The trial components were removed, and the proximal tibial surface was prepared. Final components were impacted into place. The knee was tested for a final time and found to be well balanced.   The wound was copiously irrigated with Prontosan solution and normal saline using pulse lavage.  Marcaine solution was injected into the periarticular soft tissue.  The wound was closed in layers using #1 Vicryl and Stratafix for the fascia, 2-0 Vicryl for the subcutaneous fat, 2-0 Monocryl for the deep dermal layer, 3-0 running Monocryl subcuticular Stitch, and 4-0 Monocryl stay sutures at both ends of the wound. Dermabond was applied to the skin.  Once the glue was fully dried, an Aquacell Ag and compressive dressing were applied.  The patient was transported to the recovery room in stable condition.  Sponge, needle, and instrument counts were correct at the end of the case x2.  The patient tolerated the procedure well and there were no known  complications.  The aquamantis was utilized for this case to help facilitate better hemostasis as patient was felt to be at increased risk of bleeding because of complex case requiring increased OR time and/or exposure - no  tourniquet usage.  A oscillating saw tip was utilized for this case to prevent damage to the soft tissue structures such as muscles, ligaments and tendons, and to ensure accurate bone cuts. This patient was at increased risk for above structures due to  minimally invasive approach.  Please note that a surgical assistant was a medical necessity for this procedure in order to perform it in a safe and expeditious manner. Surgical assistant was necessary to retract the ligaments and vital neurovascular structures to prevent injury to them and also necessary for proper positioning of the limb to allow for anatomic placement of the prosthesis.

## 2022-12-20 NOTE — Interval H&P Note (Signed)
History and Physical Interval Note:  12/20/2022 8:30 AM  [name]  has presented today for surgery, with the diagnosis of Right knee osteoarthritis.  The various methods of treatment have been discussed with the patient and family. After consideration of risks, benefits and other options for treatment, the patient has consented to  Procedure(s) with comments: COMPUTER ASSISTED TOTAL KNEE ARTHROPLASTY (Right) - 160 as a surgical intervention.  The patient's history has been reviewed, patient examined, no change in status, stable for surgery.  I have reviewed the patient's chart and labs.  Questions were answered to the patient's satisfaction.    The risks, benefits, and alternatives were discussed with the patient. There are risks associated with the surgery including, but not limited to, problems with anesthesia (death), infection, instability (giving out of the joint), dislocation, differences in leg length/angulation/rotation, fracture of bones, loosening or failure of implants, hematoma (blood accumulation) which may require surgical drainage, blood clots, pulmonary embolism, nerve injury (foot drop and lateral thigh numbness), and blood vessel injury. The patient understands these risks and elects to proceed.    [name]

## 2022-12-20 NOTE — Anesthesia Postprocedure Evaluation (Signed)
Anesthesia Post Note  Patient: Jamani M Kepreades-Yanez  Procedure(s) Performed: COMPUTER ASSISTED TOTAL KNEE ARTHROPLASTY (Right: Knee)     Patient location during evaluation: PACU Anesthesia Type: MAC and Spinal Level of consciousness: awake and alert Pain management: pain level controlled Vital Signs Assessment: post-procedure vital signs reviewed and stable Respiratory status: spontaneous breathing and respiratory function stable Cardiovascular status: blood pressure returned to baseline and stable Postop Assessment: spinal receding Anesthetic complications: no  No notable events documented.  Last Vitals:  Vitals:   12/20/22 1229 12/20/22 1300  BP: 123/80 102/76  Pulse: 81   Resp: 17   Temp: 36.6 C   SpO2: 96%     Last Pain:  Vitals:   12/20/22 1229  TempSrc: Axillary  PainSc:                  Nohea Kras DANIEL

## 2022-12-20 NOTE — Anesthesia Procedure Notes (Signed)
Anesthesia Regional Block: Adductor canal block   Pre-Anesthetic Checklist: , timeout performed,  Correct Patient, Correct Site, Correct Laterality,  Correct Procedure, Correct Position, site marked,  Risks and benefits discussed,  Surgical consent,  Pre-op evaluation,  At surgeon's request and post-op pain management  Laterality: Right  Prep: chloraprep       Needles:  Injection technique: Single-shot  Needle Type: Stimulator Needle - 80     Needle Length: 10cm  Needle Gauge: 21     Additional Needles:   Narrative:  Start time: 12/20/2022 8:02 AM End time: 12/20/2022 8:12 AM Injection made incrementally with aspirations every 5 mL.  Performed by: Personally  Anesthesiologist: Heather Roberts, MD

## 2022-12-20 NOTE — Anesthesia Procedure Notes (Signed)
Spinal  Patient location during procedure: OR Start time: 12/20/2022 8:41 AM End time: 12/20/2022 8:51 AM Reason for block: surgical anesthesia Staffing Performed: anesthesiologist  Anesthesiologist: Heather Roberts, MD Performed by: Heather Roberts, MD Authorized by: Heather Roberts, MD   Preanesthetic Checklist Completed: patient identified, IV checked, risks and benefits discussed, surgical consent, monitors and equipment checked, pre-op evaluation and timeout performed Spinal Block Patient position: sitting Prep: DuraPrep Patient monitoring: cardiac monitor, continuous pulse ox and blood pressure Approach: midline Location: L2-3 Injection technique: single-shot Needle Needle type: Pencan  Needle gauge: 24 G Needle length: 9 cm Assessment Events: CSF return Additional Notes Functioning IV was confirmed and monitors were applied. Sterile prep and drape, including hand hygiene and sterile gloves were used. The patient was positioned and the spine was prepped. The skin was anesthetized with lidocaine.  Free flow of clear CSF was obtained prior to injecting local anesthetic into the CSF.  The spinal needle aspirated freely following injection.  The needle was carefully withdrawn.  The patient tolerated the procedure well. Difficult placement.

## 2022-12-20 NOTE — Transfer of Care (Signed)
Immediate Anesthesia Transfer of Care Note  Patient: [name]  Procedure(s) Performed: Procedure(s) with comments: COMPUTER ASSISTED TOTAL KNEE ARTHROPLASTY (Right) - 160  Patient Location: PACU  Anesthesia Type:Spinal  Level of Consciousness:  sedated, patient cooperative and responds to stimulation  Airway & Oxygen Therapy:Patient Spontanous Breathing and Patient connected to face mask oxgen  Post-op Assessment:  Report given to PACU RN and Post -op Vital signs reviewed and stable  Post vital signs:  Reviewed and stable  Last Vitals:  Vitals:   12/20/22 0729  BP: (!) 140/80  Pulse: 84  Resp: 16  Temp: 36.7 C  SpO2: 93%    Complications: No apparent anesthesia complications

## 2022-12-20 NOTE — Evaluation (Signed)
Physical Therapy Evaluation Patient Details Name: [name] MRN: [id number] DOB: 03-22-1956 Today's Date: 12/20/2022  History of Present Illness  67 yo female presents to therapy s/p R TKA on 12/20/2022 due to failure of conservative measures. Pt has PMH including but not limited to: OSA on CPAP, pericoridal chest pain, SOB, acute coronary syndrome, HTN, and hypothyroidism.  Clinical Impression    [name] is a 67 y.o. female POD 0 s/p R TKA. Patient reports IND with mobility at baseline. Patient is now limited by functional impairments (see PT problem list below).  Patient instructed in exercises to facilitate ROM and circulation while in supine. Pt reported 7/10 pain when PT arrived pt in bed, nursing administered pain medication and then pt reported being nauseated and feeling like something was not right. Pt did initially have a decrease in diastolic Bp. PT unable to complete evaluation to assess for readiness, safety and I, for same day d/c and to return later in the day. Patient will benefit from continued skilled PT interventions to address impairments and progress towards PLOF. PT will continue to follow if pt continues acute stay to progress towards Mod I goals.       Recommendations for follow up therapy are one component of a multi-disciplinary discharge planning process, led by the attending physician.  Recommendations may be updated based on patient status, additional functional criteria and insurance authorization.  Follow Up Recommendations       Assistance Recommended at Discharge Frequent or constant Supervision/Assistance  Patient can return home with the following  Two people to help with walking and/or transfers;Two people to help with bathing/dressing/bathroom;Assistance with cooking/housework;Assist for transportation;Help with stairs or ramp for entrance    Equipment Recommendations Rolling walker (2 wheels)  Recommendations for Other  Services       Functional Status Assessment Patient has had a recent decline in their functional status and demonstrates the ability to make significant improvements in function in a reasonable and predictable amount of time.     Precautions / Restrictions Precautions Precautions: Knee;Fall Restrictions Weight Bearing Restrictions: No      Mobility  Bed Mobility                    Transfers                        Ambulation/Gait                  Stairs            Wheelchair Mobility    Modified Rankin (Stroke Patients Only)       Balance                                             Pertinent Vitals/Pain Pain Assessment Pain Assessment: 0-10 Pain Score: 7  Pain Location: R knee Pain Descriptors / Indicators: Constant, Aching, Headache, Operative site guarding Pain Intervention(s): Limited activity within patient's tolerance, Monitored during session, Premedicated before session, Repositioned, RN gave pain meds during session, Ice applied    Home Living Family/patient expects to be discharged to:: Private residence Living Arrangements: Spouse/significant other Available Help at Discharge: Family Type of Home: House Home Access: Stairs to enter Entrance Stairs-Rails: None (pt and spouse report that the steps should be deep engouh to place RW and a column as  well) Entrance Stairs-Number of Steps: 2   Home Layout: One level Home Equipment: None      Prior Function Prior Level of Function : Independent/Modified Independent;Driving             Mobility Comments: IND with all ADLs, self care tasks and IADLs       Hand Dominance        Extremity/Trunk Assessment        Lower Extremity Assessment Lower Extremity Assessment: RLE deficits/detail RLE Deficits / Details: ankle DF/PF 5/5; SLR AA RLE Sensation: WNL    Cervical / Trunk Assessment Cervical / Trunk Assessment: Other exceptions (wfl)   Communication   Communication: No difficulties  Cognition Arousal/Alertness: Awake/alert Behavior During Therapy: WFL for tasks assessed/performed Overall Cognitive Status: Within Functional Limits for tasks assessed                                          General Comments      Exercises Total Joint Exercises Ankle Circles/Pumps: AROM, Both, 20 reps Quad Sets: AROM, Right, 5 reps Short Arc Quad: AROM, Right, 5 reps Heel Slides: AROM, Right, 5 reps Hip ABduction/ADduction: AROM, Right, 5 reps Straight Leg Raises: AAROM, Right, 5 reps (pt progressed with reps and able to complete 3/5 reps AROM)   Assessment/Plan    PT Assessment Patient needs continued PT services  PT Problem List Decreased strength;Decreased range of motion;Decreased activity tolerance;Decreased mobility;Decreased balance;Decreased coordination;Pain       PT Treatment Interventions DME instruction;Gait training;Stair training;Functional mobility training;Therapeutic activities;Therapeutic exercise;Balance training;Neuromuscular re-education;Modalities;Patient/family education    PT Goals (Current goals can be found in the Care Plan section)  Acute Rehab PT Goals Patient Stated Goal: to be able to walk/hike the [place] when I visit my daughter this summer in [place] PT Goal Formulation: With patient Time For Goal Achievement: 01/03/23 Potential to Achieve Goals: Good    Frequency 7X/week     Co-evaluation               AM-PAC PT "6 Clicks" Mobility  Outcome Measure Help needed turning from your back to your side while in a flat bed without using bedrails?: A Little Help needed moving from lying on your back to sitting on the side of a flat bed without using bedrails?: Total Help needed moving to and from a bed to a chair (including a wheelchair)?: Total Help needed standing up from a chair using your arms (e.g., wheelchair or bedside chair)?: Total Help needed to walk in  hospital room?: Total Help needed climbing 3-5 steps with a railing? : Total 6 Click Score: 8    End of Session   Activity Tolerance: Treatment limited secondary to medical complications (Comment) (reports of nausea) Patient left: in bed;with call bell/phone within reach;with family/visitor present Nurse Communication: Other (comment) (pt reports of nausea s/p pain medication and decrease of diastolic Bp from 161/09 at rest semi reclined to 133/73 semi reclined) PT Visit Diagnosis: Other abnormalities of gait and mobility (R26.89);Unsteadiness on feet (R26.81);Muscle weakness (generalized) (M62.81);Pain Pain - Right/Left: Right Pain - part of body: Knee    Time: 1357-1430 PT Time Calculation (min) (ACUTE ONLY): 33 min   Charges:   PT Evaluation $PT Eval Low Complexity: 1 Low PT Treatments $Therapeutic Exercise: 8-22 mins        Rica Mote, PT   Jacqualyn Posey 12/20/2022, 4:04 PM

## 2022-12-20 NOTE — Progress Notes (Addendum)
Physical Therapy Treatment Patient Details Name: [name] MRN: [id number] DOB: 26-Oct-1955 Today's Date: 12/20/2022   History of Present Illness 67 yo female presents to therapy s/p R TKA on 12/20/2022 due to failure of conservative measures. Pt has PMH including but not limited to: OSA on CPAP, pericoridal chest pain, SOB, acute coronary syndrome, HTN, and hypothyroidism.    PT Comments     Kiwanna is a 67 y.o. female POD 0 s/p R TKA. Patient reports IND with mobility at baseline. PT returned ~ 50 mins later to complete assessment. Patient is now limited by functional impairments (see PT problem list below) and requires min guard for transfers and gait with RW. Patient was able to ambulate 45 feet x 2 with RW and min guard and cues for safe walker management. Patient educated on safe sequencing for stair mobility, pain management and use of CP/iceman machine and verbalized safe guarding position for people assisting with mobility. Patient instructed in exercises to facilitate ROM and circulation HEP completed, reviewed and HO provided. Patient will benefit from continued skilled PT interventions to address impairments and progress towards PLOF. Patient has met mobility goals at adequate level for discharge home with family support and OPPT 5/13 per pt report; will continue to follow if pt continues acute stay to progress towards Mod I goals. Bp semi reclined at rest 127/71 (PR 88)  Standing Bp 130/75 (PR 92)  Recommendations for follow up therapy are one component of a multi-disciplinary discharge planning process, led by the attending physician.  Recommendations may be updated based on patient status, additional functional criteria and insurance authorization.  Follow Up Recommendations       Assistance Recommended at Discharge Intermittent Supervision/Assistance  Patient can return home with the following Assistance with cooking/housework;Assist for  transportation;Help with stairs or ramp for entrance;A little help with walking and/or transfers;A little help with bathing/dressing/bathroom   Equipment Recommendations  Rolling walker (2 wheels) (provided and adjusted at eval)    Recommendations for Other Services       Precautions / Restrictions Precautions Precautions: Knee;Fall Restrictions Weight Bearing Restrictions: No     Mobility  Bed Mobility Overal bed mobility: Needs Assistance Bed Mobility: Supine to Sit     Supine to sit: Min guard     General bed mobility comments: cues for use of LLE to assist to scoot R LE to EOB    Transfers Overall transfer level: Needs assistance Equipment used: Rolling walker (2 wheels) Transfers: Sit to/from Stand Sit to Stand: Min guard           General transfer comment: cues for proepr UE and AD placement with bed, recliner and commdoe transfers    Ambulation/Gait Ambulation/Gait assistance: Min guard Gait Distance (Feet): 45 Feet Assistive device: Rolling walker (2 wheels) Gait Pattern/deviations: Step-to pattern, Antalgic Gait velocity: decreased     General Gait Details: reliance on B UE support in R stance phase   Stairs Stairs: Yes Stairs assistance: Min guard Stair Management: Two rails Number of Stairs: 2 General stair comments: cues for proper sequencing and technique. pt and spouse indicate steps are deep enought to place RW and use for support, PT provided pt and family ed on use of RW to navigate steps   Wheelchair Mobility    Modified Rankin (Stroke Patients Only)       Balance Overall balance assessment: Needs assistance Sitting-balance support: Bilateral upper extremity supported Sitting balance-Leahy Scale: Fair     Standing balance support: Bilateral  upper extremity supported, During functional activity, Reliant on assistive device for balance Standing balance-Leahy Scale: Poor                              Cognition  Arousal/Alertness: Awake/alert Behavior During Therapy: WFL for tasks assessed/performed Overall Cognitive Status: Within Functional Limits for tasks assessed                                          Exercises Total Joint Exercises Ankle Circles/Pumps: AROM, Both, 20 reps Quad Sets: AROM, Right, 5 reps Short Arc Quad: AROM, Right, 5 reps Heel Slides: AROM, Right, 5 reps Hip ABduction/ADduction: AROM, Right, 5 reps Straight Leg Raises:  (pt progressed with reps and able to complete 3/5 reps AROM) Long Arc Quad: AROM, Right, 5 reps    General Comments General comments (skin integrity, edema, etc.): no change in BP from supine to stand, no reports of light headedness or dizziness, ongoing reports of "something is just wrong"  and nausea t/o      Pertinent Vitals/Pain Pain Assessment Pain Assessment: 0-10 Pain Score: 7  Pain Location: R knee Pain Descriptors / Indicators: Constant, Aching, Headache, Operative site guarding Pain Intervention(s): Limited activity within patient's tolerance, Monitored during session, Premedicated before session, Repositioned, Ice applied (no change in pain report s/p medications ~ 60 mins prior to PT return)    Home Living Family/patient expects to be discharged to:: Private residence Living Arrangements: Spouse/significant other Available Help at Discharge: Family Type of Home: House Home Access: Stairs to enter Entrance Stairs-Rails: None (pt and spouse report that the steps should be deep engouh to place RW and a column as well) Entrance Stairs-Number of Steps: 2   Home Layout: One level Home Equipment: None      Prior Function            PT Goals (current goals can now be found in the care plan section) Acute Rehab PT Goals Patient Stated Goal: to be able to walk/hike the [place] when I visit my daughter this summer in [place] PT Goal Formulation: With patient Time For Goal Achievement: 01/03/23 Potential to Achieve  Goals: Good    Frequency    7X/week      PT Plan      Co-evaluation              AM-PAC PT "6 Clicks" Mobility   Outcome Measure  Help needed turning from your back to your side while in a flat bed without using bedrails?: None Help needed moving from lying on your back to sitting on the side of a flat bed without using bedrails?: A Little Help needed moving to and from a bed to a chair (including a wheelchair)?: A Little Help needed standing up from a chair using your arms (e.g., wheelchair or bedside chair)?: A Little Help needed to walk in hospital room?: A Little Help needed climbing 3-5 steps with a railing? : A Little 6 Click Score: 19    End of Session Equipment Utilized During Treatment: Gait belt Activity Tolerance: No increased pain;Patient tolerated treatment well (ongoing reports of nausea) Patient left: in chair;with call bell/phone within reach;with family/visitor present Nurse Communication: Mobility status;Other (comment) (pt readiness from PT standpoint with ongoing pt report of nausea) PT Visit Diagnosis: Other abnormalities of gait and mobility (R26.89);Unsteadiness on feet (  R26.81);Muscle weakness (generalized) (M62.81);Pain Pain - Right/Left: Right Pain - part of body: Knee     Time: 1610-9604 PT Time Calculation (min) (ACUTE ONLY): 34 min  Charges:  $Gait Training: 8-22 mins $Therapeutic Exercise: 8-22 mins                     Rica Mote, PT    Jacqualyn Posey 12/20/2022, 4:14 PM

## 2022-12-20 NOTE — Discharge Instructions (Signed)
 Dr. Brian Swinteck Total Joint Specialist [facility] [address] [place], [place] [address] [phone]  TOTAL KNEE REPLACEMENT POSTOPERATIVE DIRECTIONS    Knee Rehabilitation, Guidelines Following Surgery  Results after knee surgery are often greatly improved when you follow the exercise, range of motion and muscle strengthening exercises prescribed by your doctor. Safety measures are also important to protect the knee from further injury. Any time any of these exercises cause you to have increased pain or swelling in your knee joint, decrease the amount until you are comfortable again and slowly increase them. If you have problems or questions, call your caregiver or physical therapist for advice.   WEIGHT BEARING Weight bearing as tolerated with assist device (walker, cane, etc) as directed, use it as long as suggested by your surgeon or therapist, typically at least 4-6 weeks.  HOME CARE INSTRUCTIONS  Remove items at home which could result in a fall. This includes throw rugs or furniture in walking pathways.  Continue medications as instructed at time of discharge. You may have some home medications which will be placed on hold until you complete the course of blood thinner medication.  You may start showering once you are discharged home but do not submerge the incision under water. Just pat the incision dry and apply a dry gauze dressing on daily. Walk with walker as instructed.  You may resume a sexual relationship in one month or when given the OK by your doctor.  Use walker as long as suggested by your caregivers. Avoid periods of inactivity such as sitting longer than an hour when not asleep. This helps prevent blood clots.  You may put full weight on your legs and walk as much as is comfortable.  You may return to work once you are cleared by your doctor.  Do not drive a car for 6 weeks or until released by you surgeon.  Do not drive while  taking narcotics.  Wear the elastic stockings for three weeks following surgery during the day but you may remove then at night. Make sure you keep all of your appointments after your operation with all of your doctors and caregivers. You should call the office at the above phone number and make an appointment for approximately two weeks after the date of your surgery. Do not remove your surgical dressing. The dressing is waterproof; you may take showers in 3 days, but do not take tub baths or submerge the dressing. Please pick up a stool softener and laxative for home use as long as you are requiring pain medications. ICE to the affected knee every three hours for 30 minutes at a time and then as needed for pain and swelling.  Continue to use ice on the knee for pain and swelling from surgery. You may notice swelling that will progress down to the foot and ankle.  This is normal after surgery.  Elevate the leg when you are not up walking on it.   It is important for you to complete the blood thinner medication as prescribed by your doctor. Continue to use the breathing machine which will help keep your temperature down.  It is common for your temperature to cycle up and down following surgery, especially at night when you are not up moving around and exerting yourself.  The breathing machine keeps your lungs expanded and your temperature down.  RANGE OF MOTION AND STRENGTHENING EXERCISES  Rehabilitation of the knee is important following a knee injury or an   operation. After just a few days of immobilization, the muscles of the thigh which control the knee become weakened and shrink (atrophy). Knee exercises are designed to build up the tone and strength of the thigh muscles and to improve knee motion. Often times heat used for twenty to thirty minutes before working out will loosen up your tissues and help with improving the range of motion but do not use heat for the first two weeks following surgery.  These exercises can be done on a training (exercise) mat, on the floor, on a table or on a bed. Use what ever works the best and is most comfortable for you Knee exercises include:  Leg Lifts - While your knee is still immobilized in a splint or cast, you can do straight leg raises. Lift the leg to 60 degrees, hold for 3 sec, and slowly lower the leg. Repeat 10-20 times 2-3 times daily. Perform this exercise against resistance later as your knee gets better.  Quad and Hamstring Sets - Tighten up the muscle on the front of the thigh (Quad) and hold for 5-10 sec. Repeat this 10-20 times hourly. Hamstring sets are done by pushing the foot backward against an object and holding for 5-10 sec. Repeat as with quad sets.  A rehabilitation program following serious knee injuries can speed recovery and prevent re-injury in the future due to weakened muscles. Contact your doctor or a physical therapist for more information on knee rehabilitation.   POST-OPERATIVE OPIOID TAPER INSTRUCTIONS: It is important to wean off of your opioid medication as soon as possible. If you do not need pain medication after your surgery it is ok to stop day one. Opioids include: Codeine, Hydrocodone(Norco, Vicodin), Oxycodone(Percocet, oxycontin) and hydromorphone amongst others.  Long term and even short term use of opiods can cause: Increased pain response Dependence Constipation Depression Respiratory depression And more.  Withdrawal symptoms can include Flu like symptoms Nausea, vomiting And more Techniques to manage these symptoms Hydrate well Eat regular healthy meals Stay active Use relaxation techniques(deep breathing, meditating, yoga) Do Not substitute Alcohol to help with tapering If you have been on opioids for less than two weeks and do not have pain than it is ok to stop all together.  Plan to wean off of opioids This plan should start within one week post op of your joint replacement. Maintain the same  interval or time between taking each dose and first decrease the dose.  Cut the total daily intake of opioids by one tablet each day Next start to increase the time between doses. The last dose that should be eliminated is the evening dose.    SKILLED REHAB INSTRUCTIONS: If the patient is transferred to a skilled rehab facility following release from the hospital, a list of the current medications will be sent to the facility for the patient to continue.  When discharged from the skilled rehab facility, please have the facility set up the patient's Home Health Physical Therapy prior to being released. Also, the skilled facility will be responsible for providing the patient with their medications at time of release from the facility to include their pain medication, the muscle relaxants, and their blood thinner medication. If the patient is still at the rehab facility at time of the two week follow up appointment, the skilled rehab facility will also need to assist the patient in arranging follow up appointment in our office and any transportation needs.  MAKE SURE YOU:  Understand these instructions.  Will watch   your condition.  Will get help right away if you are not doing well or get worse.    Pick up stool softner and laxative for home use following surgery while on pain medications. Do NOT remove your dressing. You may shower.  Do not take tub baths or submerge incision under water. May shower starting three days after surgery. Please use a clean towel to pat the incision dry following showers. Continue to use ice for pain and swelling after surgery. Do not use any lotions or creams on the incision until instructed by your surgeon.  

## 2022-12-21 ENCOUNTER — Other Ambulatory Visit (HOSPITAL_COMMUNITY): Payer: Self-pay

## 2022-12-21 DIAGNOSIS — M1711 Unilateral primary osteoarthritis, right knee: Secondary | ICD-10-CM | POA: Diagnosis not present

## 2022-12-21 LAB — BASIC METABOLIC PANEL
Anion gap: 7 (ref 5–15)
BUN: 18 mg/dL (ref 8–23)
CO2: 24 mmol/L (ref 22–32)
Calcium: 8.4 mg/dL — ABNORMAL LOW (ref 8.9–10.3)
Chloride: 106 mmol/L (ref 98–111)
Creatinine, Ser: 0.79 mg/dL (ref 0.44–1.00)
GFR, Estimated: >60 mL/min (ref >60)
Glucose, Bld: 141 mg/dL — ABNORMAL HIGH (ref 70–99)
Potassium: 4.3 mmol/L (ref 3.5–5.1)
Sodium: 137 mmol/L (ref 135–145)

## 2022-12-21 LAB — CBC
HCT: 31.4 % — ABNORMAL LOW (ref 36.0–46.0)
Hemoglobin: 10.1 g/dL — ABNORMAL LOW (ref 12.0–15.0)
MCH: 25.7 pg — ABNORMAL LOW (ref 26.0–34.0)
MCHC: 32.2 g/dL (ref 30.0–36.0)
MCV: 79.9 fL — ABNORMAL LOW (ref 80.0–100.0)
Platelets: 220 10*3/uL (ref 150–400)
RBC: 3.93 MIL/uL (ref 3.87–5.11)
RDW: 14.9 % (ref 11.5–15.5)
WBC: 14.1 10*3/uL — ABNORMAL HIGH (ref 4.0–10.5)
nRBC: 0 % (ref 0.0–0.2)

## 2022-12-21 NOTE — Progress Notes (Signed)
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 Pt has PMH including but not limited to: OSA on CPAP, pericoridal chest pain, SOB, acute coronary syndrome, HTN, and hypothyroidism.    PT Comments    Pt is progressing well with mobility and is ready to DC home from a PT standpoint. She ambulated 150' with RW, no loss of balance. Stair training completed. Pt demonstrates good understanding of HEP.    Recommendations for follow up therapy are one component of a multi-disciplinary discharge planning process, led by the attending physician.  Recommendations may be updated based on patient status, additional functional criteria and insurance authorization.  Follow Up Recommendations       Assistance Recommended at Discharge Intermittent Supervision/Assistance  Patient can return home with the following Assistance with cooking/housework;Assist for transportation;Help with stairs or ramp for entrance;A little help with walking and/or transfers;A little help with bathing/dressing/bathroom   Equipment Recommendations  Rolling walker (2 wheels)    Recommendations for Other Services       Precautions / Restrictions Precautions Precautions: Knee Precaution Booklet Issued: Yes (comment) Precaution Comments: reviewed no pillow under knee Restrictions Weight Bearing Restrictions: No     Mobility  Bed Mobility               General bed mobility comments: in bathroom on commode at start of session    Transfers Overall transfer level: Needs assistance Equipment used: Rolling walker (2 wheels) Transfers: Sit to/from Stand Sit to Stand: Supervision           General transfer comment: VCs hand placement    Ambulation/Gait Ambulation/Gait assistance: Supervision Gait  Distance (Feet): 150 Feet Assistive device: Rolling walker (2 wheels) Gait Pattern/deviations: Step-to pattern, Antalgic Gait velocity: decreased     General Gait Details: steady, no buckling, no loss of balance   Stairs Stairs: Yes Stairs assistance: Min assist Stair Management: No rails, Backwards, Step to pattern, With walker Number of Stairs: 6 General stair comments: VCs sequencing, min A to steady RW, 2 steps x 3 trials   Wheelchair Mobility    Modified Rankin (Stroke Patients Only)       Balance Overall balance assessment: Needs assistance Sitting-balance support: Bilateral upper extremity supported Sitting balance-Leahy Scale: Fair     Standing balance support: Bilateral upper extremity supported, During functional activity, Reliant on assistive device for balance Standing balance-Leahy Scale: Poor                              Cognition Arousal/Alertness: Awake/alert Behavior During Therapy: WFL for tasks assessed/performed Overall Cognitive Status: Within Functional Limits for tasks assessed                                          Exercises Total Joint Exercises Ankle Circles/Pumps: AROM, Both, 20 reps Quad Sets: AROM, Right, 5 reps, Supine Short Arc Quad: Right, 5 reps, AAROM, Supine Heel Slides: Right, 5 reps, AAROM, Supine Hip ABduction/ADduction: Right, 5 reps, AAROM, Supine Straight Leg Raises: AAROM, Right, 5 reps, Supine Long Arc Quad: AROM, Right, 5 reps, Seated Knee Flexion: AAROM, Right, 5 reps, Seated Goniometric ROM: 5-55* AAROM R knee    General  Comments        Pertinent Vitals/Pain Pain Assessment Pain Score: 6  Pain Location: R knee Pain Descriptors / Indicators: Constant, Aching, Headache, Operative site guarding Pain Intervention(s): Limited activity within patient's tolerance, Monitored during session, Premedicated before session, Ice applied    Home Living                           Prior Function            PT Goals (current goals can now be found in the care plan section) Acute Rehab PT Goals Patient Stated Goal: to be able to walk[place]hike [place] when I visit my daughter this summer in [place] PT Goal Formulation: With patient Time For Goal Achievement: 01/03/23 Potential to Achieve Goals: Good Progress towards PT goals: Progressing toward goals    Frequency    7X/week      PT Plan      Co-evaluation              AM-PAC PT "6 Clicks" Mobility   Outcome Measure  Help needed turning from your back to your side while in a flat bed without using bedrails?: None Help needed moving from lying on your back to sitting on the side of a flat bed without using bedrails?: A Little Help needed moving to and from a bed to a chair (including a wheelchair)?: None Help needed standing up from a chair using your arms (e.g., wheelchair or bedside chair)?: None Help needed to walk in hospital room?: None Help needed climbing 3-5 steps with a railing? : A Little 6 Click Score: 22    End of Session Equipment Utilized During Treatment: Gait belt Activity Tolerance: No increased pain;Patient tolerated treatment well Patient left: with call bell/phone within reach;Other (comment) (on toilet) Nurse Communication: Mobility status PT Visit Diagnosis: Other abnormalities of gait and mobility (R26.89);Unsteadiness on feet (R26.81);Muscle weakness (generalized) (M62.81);Pain Pain - Right/Left: Right Pain - part of body: Knee     Time: 1610-9604 PT Time Calculation (min) (ACUTE ONLY): 65 min  Charges:  $Gait Training: 23-37 mins $Therapeutic Exercise: 8-22 mins $Therapeutic Activity: 8-22 mins                     Ralene Bathe Kistler PT 12/21/2022  Acute Rehabilitation Services  Office [phone]

## 2022-12-21 NOTE — Progress Notes (Signed)
    Subjective:  Patient reports pain as mild to moderate.  Denies N/V/CP/SOB/Abd pain currently. Patient kept overnight due to nausea and vomiting. Patient states she is feeling much better. She states her pain is well controlled. She denies any tingling or numbness in LE bilaterally.   Objective:   VITALS:   Vitals:   12/20/22 2249 12/20/22 2301 12/21/22 0153 12/21/22 0506  BP: 116/77  113/72 128/72  Pulse: 98 100 80 94  Resp: 18 20 17 17   Temp: 98.6 F (37 C)  98.2 F (36.8 C) 98.7 F (37.1 C)  TempSrc: Oral  Oral Oral  SpO2: 98% 97% 96% 100%  Weight:      Height:        Patient sitting up in bed. NAD.  Neurologically intact ABD soft Neurovascular intact Sensation intact distally Intact pulses distally Dorsiflexion/Plantar flexion intact Incision: dressing C/D/I No cellulitis present Compartment soft   Lab Results  Component Value Date   WBC 14.1 (H) 12/21/2022   HGB 10.1 (L) 12/21/2022   HCT 31.4 (L) 12/21/2022   MCV 79.9 (L) 12/21/2022   PLT 220 12/21/2022   BMET    Component Value Date/Time   NA 137 12/21/2022 0345   NA 140 06/16/2022 0948   K 4.3 12/21/2022 0345   CL 106 12/21/2022 0345   CO2 24 12/21/2022 0345   GLUCOSE 141 (H) 12/21/2022 0345   BUN 18 12/21/2022 0345   BUN 20 06/16/2022 0948   CREATININE 0.79 12/21/2022 0345   CREATININE 0.73 10/21/2011 1238   CALCIUM 8.4 (L) 12/21/2022 0345   EGFR 95 06/16/2022 0948   GFRNONAA >60 12/21/2022 0345     Assessment/Plan: 1 Day Post-Op   Principal Problem:   Osteoarthritis of right knee   WBAT with walker DVT ppx: Aspirin, SCDs, TEDS PO pain control PT/OT: Patient ambulated 45 feet x2 with PT yesterday.  Dispo: D/c home with OPPT once cleared with PT.    Clois Dupes, PA-C 12/21/2022, 7:48 AM   [facility]  [place] [address], [address], [place], [place] [address] Phone: [phone] [email or web address] Facebook  [employer]

## 2022-12-21 NOTE — Plan of Care (Signed)
  Problem: Education: Goal: Knowledge of the prescribed therapeutic regimen will improve Outcome: Adequate for Discharge Goal: Individualized Educational Video(s) Outcome: Adequate for Discharge   Problem: Activity: Goal: Ability to avoid complications of mobility impairment will improve 12/21/2022 1151 by Delila Spence, LPN Outcome: Adequate for Discharge 12/21/2022 5366 by Delila Spence, LPN Outcome: Progressing Goal: Range of joint motion will improve Outcome: Adequate for Discharge   Problem: Clinical Measurements: Goal: Postoperative complications will be avoided or minimized Outcome: Adequate for Discharge   Problem: Pain Management: Goal: Pain level will decrease with appropriate interventions 12/21/2022 1151 by Delila Spence, LPN Outcome: Adequate for Discharge 12/21/2022 0728 by Delila Spence, LPN Outcome: Progressing   Problem: Skin Integrity: Goal: Will show signs of wound healing Outcome: Adequate for Discharge   Problem: Education: Goal: Knowledge of General Education information will improve Description: Including pain rating scale, medication(s)/side effects and non-pharmacologic comfort measures Outcome: Adequate for Discharge   Problem: Health Behavior/Discharge Planning: Goal: Ability to manage health-related needs will improve Outcome: Adequate for Discharge   Problem: Clinical Measurements: Goal: Ability to maintain clinical measurements within normal limits will improve Outcome: Adequate for Discharge Goal: Will remain free from infection Outcome: Adequate for Discharge Goal: Diagnostic test results will improve Outcome: Adequate for Discharge Goal: Respiratory complications will improve Outcome: Adequate for Discharge Goal: Cardiovascular complication will be avoided Outcome: Adequate for Discharge   Problem: Activity: Goal: Risk for activity intolerance will decrease Outcome: Adequate for Discharge   Problem: Nutrition: Goal: Adequate  nutrition will be maintained Outcome: Adequate for Discharge   Problem: Coping: Goal: Level of anxiety will decrease Outcome: Adequate for Discharge   Problem: Elimination: Goal: Will not experience complications related to bowel motility Outcome: Adequate for Discharge Goal: Will not experience complications related to urinary retention Outcome: Adequate for Discharge   Problem: Pain Managment: Goal: General experience of comfort will improve Outcome: Adequate for Discharge   Problem: Safety: Goal: Ability to remain free from injury will improve 12/21/2022 1151 by Delila Spence, LPN Outcome: Adequate for Discharge 12/21/2022 0728 by Delila Spence, LPN Outcome: Progressing   Problem: Skin Integrity: Goal: Risk for impaired skin integrity will decrease Outcome: Adequate for Discharge

## 2022-12-21 NOTE — Plan of Care (Signed)
  Problem: Activity: Goal: Ability to avoid complications of mobility impairment will improve Outcome: Progressing   Problem: Pain Management: Goal: Pain level will decrease with appropriate interventions Outcome: Progressing   Problem: Safety: Goal: Ability to remain free from injury will improve Outcome: Progressing   

## 2022-12-21 NOTE — TOC Transition Note (Signed)
Transition of Care [facility]) - CM/SW Discharge Note   Patient Details  Name: [name] MRN: [id number] Date of Birth: 1955/11/16  Transition of Care [facility] CM/SW Contact:  Amada Jupiter, LCSW Phone Number: 12/21/2022, 10:29 AM   Clinical Narrative:     Met briefly with pt and confirming she has received RW to room via [employer].  OPPT already set up with [facility].  No further TOC needs.  Final next level of care: OP Rehab Barriers to Discharge: No Barriers Identified   Patient Goals and CMS Choice      Discharge Placement                         Discharge Plan and Services Additional resources added to the After Visit Summary for                  DME Arranged: Walker rolling DME Agency: [employer]                  Social Determinants of Health (SDOH) Interventions SDOH Screenings   Food Insecurity: No Food Insecurity (12/20/2022)  Housing: Low Risk  (12/20/2022)  Transportation Needs: No Transportation Needs (12/20/2022)  Utilities: Not At Risk (12/20/2022)  Tobacco Use: Low Risk  (12/20/2022)     Readmission Risk Interventions     No data to display

## 2022-12-22 ENCOUNTER — Encounter (HOSPITAL_COMMUNITY): Payer: Self-pay | Admitting: Orthopedic Surgery

## 2022-12-22 NOTE — Discharge Summary (Signed)
Physician Discharge Summary  Patient ID: [name] MRN: [id number] DOB/AGE: 1956/05/28 67 y.o.  Admit date: 12/20/2022 Discharge date: 12/21/2022  Admission Diagnoses:  Osteoarthritis of right knee  Discharge Diagnoses:  Principal Problem:   Osteoarthritis of right knee   Past Medical History:  Diagnosis Date   Arthritis    Dyspnea    with exertion   Family history of adverse reaction to anesthesia    mother- had problems with knee surgery but does not remember what   GERD (gastroesophageal reflux disease)    Heart disorder    Heart Muscle Spasms   Heart murmur    as a child   Hypertension    Hypothyroidism    Pancreatic insufficiency    PONV (postoperative nausea and vomiting)    Pre-diabetes    Sleep apnea    cpap    Surgeries: Procedure(s): COMPUTER ASSISTED TOTAL KNEE ARTHROPLASTY on 12/20/2022   Consultants (if any):   Discharged Condition: Improved  Hospital Course: [name] is an 67 y.o. female who was admitted 12/20/2022 with a diagnosis of Osteoarthritis of right knee and went to the operating room on 12/20/2022 and underwent the above named procedures.    She was given perioperative antibiotics:  Anti-infectives (From admission, onward)    Start     Dose/Rate Route Frequency Ordered Stop   12/20/22 1430  ceFAZolin (ANCEF) IVPB 2g/100 mL premix        2 g 200 mL/hr over 30 Minutes Intravenous Every 6 hours 12/20/22 1109 12/21/22 0229   12/20/22 0645  ceFAZolin (ANCEF) IVPB 2g/100 mL premix        2 g 200 mL/hr over 30 Minutes Intravenous On call to O.R. 12/20/22 0630 12/20/22 0841       She was given sequential compression devices, early ambulation, and aspirin for DVT prophylaxis.  POD#1 Patient was kept overnight due to N/V that was resolved by morning. She ambulated well with PT. Pain controlled with medication. D/c home with OPPT.   She benefited maximally from the hospital stay and there were no complications.     Recent vital signs:  Vitals:   12/21/22 0506 12/21/22 0931  BP: 128/72 (!) 149/83  Pulse: 94 93  Resp: 17 18  Temp: 98.7 F (37.1 C) 98.1 F (36.7 C)  SpO2: 100% 99%    Recent laboratory studies:  Lab Results  Component Value Date   HGB 10.1 (L) 12/21/2022   HGB 13.1 12/07/2022   HGB 13.0 10/04/2021   Lab Results  Component Value Date   WBC 14.1 (H) 12/21/2022   PLT 220 12/21/2022   Lab Results  Component Value Date   INR 1.02 04/01/2010   Lab Results  Component Value Date   NA 137 12/21/2022   K 4.3 12/21/2022   CL 106 12/21/2022   CO2 24 12/21/2022   BUN 18 12/21/2022   CREATININE 0.79 12/21/2022   GLUCOSE 141 (H) 12/21/2022     Allergies as of 12/21/2022       Reactions   Tylenol [acetaminophen] Other (See Comments)   Makes her feel jumpy.        Medication List     STOP taking these medications    aspirin EC 81 MG tablet Replaced by: aspirin 81 MG chewable tablet       TAKE these medications    albuterol 108 (90 Base) MCG/ACT inhaler Commonly known as: VENTOLIN HFA Inhale 2 puffs into the lungs every 6 (six) hours as needed for  wheezing or shortness of breath.   amLODipine 5 MG tablet Commonly known as: NORVASC Take 5 mg by mouth daily before breakfast.   aspirin 81 MG chewable tablet Commonly known as: Aspirin Childrens Chew 1 tablet (81 mg total) by mouth 2 (two) times daily with a meal. Replaces: aspirin EC 81 MG tablet   atorvastatin 10 MG tablet Commonly known as: LIPITOR Take 1 tablet (10 mg total) by mouth daily. What changed: when to take this   Azelastine HCl 137 MCG/SPRAY Soln Place 2 sprays into both nostrils every evening.   Creon 36000 UNITS Cpep capsule Generic drug: lipase/protease/amylase Take 72,000 Units by mouth 3 (three) times daily with meals.   docusate sodium 100 MG capsule Commonly known as: Colace Take 1 capsule (100 mg total) by mouth 2 (two) times daily.   famotidine 10 MG tablet Commonly  known as: PEPCID Take 10 mg by mouth daily as needed for heartburn.   fluticasone 50 MCG/ACT nasal spray Commonly known as: FLONASE Place 2 sprays into both nostrils daily.   levothyroxine 50 MCG tablet Commonly known as: SYNTHROID Take 50 mcg by mouth daily before breakfast.   losartan 100 MG tablet Commonly known as: COZAAR Take 100 mg by mouth daily before breakfast.   meloxicam 15 MG tablet Commonly known as: MOBIC Take 1 tablet (15 mg total) by mouth daily.   methocarbamol 500 MG tablet Commonly known as: ROBAXIN Take 1 tablet (500 mg total) by mouth every 6 (six) hours as needed for muscle spasms.   multivitamin with minerals tablet Take 1 tablet by mouth daily.   ondansetron 4 MG tablet Commonly known as: Zofran Take 1 tablet (4 mg total) by mouth every 8 (eight) hours as needed for nausea or vomiting.   oxyCODONE 5 MG immediate release tablet Commonly known as: Roxicodone Take 1 tablet (5 mg total) by mouth every 4 (four) hours as needed for up to 7 days for severe pain or moderate pain.   polyethylene glycol 17 g packet Commonly known as: MiraLax Take 17 g by mouth daily as needed for mild constipation or moderate constipation.   senna 8.6 MG Tabs tablet Commonly known as: SENOKOT Take 2 tablets (17.2 mg total) by mouth at bedtime for 15 days.   traZODone 50 MG tablet Commonly known as: DESYREL Take 1/2 to 1 whole tablet by mouth at bedtime as needed for sleep. Do not drive after taking   vitamin C 1000 MG tablet Take 1,000 mg by mouth daily.   Vitamin D3 Maximum Strength 125 MCG (5000 UT) capsule Generic drug: Cholecalciferol Take 5,000 Units by mouth daily.               Discharge Care Instructions  (From admission, onward)           Start     Ordered   12/20/22 0000  Weight bearing as tolerated        12/20/22 0806   12/20/22 0000  Change dressing       Comments: Do not remove your dressing.   12/20/22 0806               WEIGHT BEARING   Weight bearing as tolerated with assist device (walker, cane, etc) as directed, use it as long as suggested by your surgeon or therapist, typically at least 4-6 weeks.   EXERCISES  Results after joint replacement surgery are often greatly improved when you follow the exercise, range of motion and muscle strengthening exercises prescribed by  your doctor. Safety measures are also important to protect the joint from further injury. Any time any of these exercises cause you to have increased pain or swelling, decrease what you are doing until you are comfortable again and then slowly increase them. If you have problems or questions, call your caregiver or physical therapist for advice.   Rehabilitation is important following a joint replacement. After just a few days of immobilization, the muscles of the leg can become weakened and shrink (atrophy).  These exercises are designed to build up the tone and strength of the thigh and leg muscles and to improve motion. Often times heat used for twenty to thirty minutes before working out will loosen up your tissues and help with improving the range of motion but do not use heat for the first two weeks following surgery (sometimes heat can increase post-operative swelling).   These exercises can be done on a training (exercise) mat, on the floor, on a table or on a bed. Use whatever works the best and is most comfortable for you.    Use music or television while you are exercising so that the exercises are a pleasant break in your day. This will make your life better with the exercises acting as a break in your routine that you can look forward to.   Perform all exercises about fifteen times, three times per day or as directed.  You should exercise both the operative leg and the other leg as well.  Exercises include:   Quad Sets - Tighten up the muscle on the front of the thigh (Quad) and hold for 5-10 seconds.   Straight Leg Raises -  With your knee straight (if you were given a brace, keep it on), lift the leg to 60 degrees, hold for 3 seconds, and slowly lower the leg.  Perform this exercise against resistance later as your leg gets stronger.  Leg Slides: Lying on your back, slowly slide your foot toward your buttocks, bending your knee up off the floor (only go as far as is comfortable). Then slowly slide your foot back down until your leg is flat on the floor again.  Angel Wings: Lying on your back spread your legs to the side as far apart as you can without causing discomfort.  Hamstring Strength:  Lying on your back, push your heel against the floor with your leg straight by tightening up the muscles of your buttocks.  Repeat, but this time bend your knee to a comfortable angle, and push your heel against the floor.  You may put a pillow under the heel to make it more comfortable if necessary.   A rehabilitation program following joint replacement surgery can speed recovery and prevent re-injury in the future due to weakened muscles. Contact your doctor or a physical therapist for more information on knee rehabilitation.    CONSTIPATION  Constipation is defined medically as fewer than three stools per week and severe constipation as less than one stool per week.  Even if you have a regular bowel pattern at home, your normal regimen is likely to be disrupted due to multiple reasons following surgery.  Combination of anesthesia, postoperative narcotics, change in appetite and fluid intake all can affect your bowels.   YOU MUST use at least one of the following options; they are listed in order of increasing strength to get the job done.  They are all available over the counter, and you may need to use some, POSSIBLY even all of  these options:    Drink plenty of fluids (prune juice may be helpful) and high fiber foods Colace 100 mg by mouth twice a day  Senokot for constipation as directed and as needed Dulcolax (bisacodyl),  take with full glass of water  Miralax (polyethylene glycol) once or twice a day as needed.  If you have tried all these things and are unable to have a bowel movement in the first 3-4 days after surgery call either your surgeon or your primary doctor.    If you experience loose stools or diarrhea, hold the medications until you stool forms back up.  If your symptoms do not get better within 1 week or if they get worse, check with your doctor.  If you experience "the worst abdominal pain ever" or develop nausea or vomiting, please contact the office immediately for further recommendations for treatment.   ITCHING:  If you experience itching with your medications, try taking only a single pain pill, or even half a pain pill at a time.  You can also use Benadryl over the counter for itching or also to help with sleep.   TED HOSE STOCKINGS:  Use stockings on both legs until for at least 2 weeks or as directed by physician office. They may be removed at night for sleeping.  MEDICATIONS:  See your medication summary on the "After Visit Summary" that nursing will review with you.  You may have some home medications which will be placed on hold until you complete the course of blood thinner medication.  It is important for you to complete the blood thinner medication as prescribed.  PRECAUTIONS:  If you experience chest pain or shortness of breath - call 911 immediately for transfer to the hospital emergency department.   If you develop a fever greater that 101 F, purulent drainage from wound, increased redness or drainage from wound, foul odor from the wound/dressing, or calf pain - CONTACT YOUR SURGEON.                                                   FOLLOW-UP APPOINTMENTS:  If you do not already have a post-op appointment, please call the office for an appointment to be seen by your surgeon.  Guidelines for how soon to be seen are listed in your "After Visit Summary", but are typically between 1-4  weeks after surgery.  OTHER INSTRUCTIONS:   Knee Replacement:  Do not place pillow under knee, focus on keeping the knee straight while resting. CPM instructions: 0-90 degrees, 2 hours in the morning, 2 hours in the afternoon, and 2 hours in the evening. Place foam block, curve side up under heel at all times except when in CPM or when walking.  DO NOT modify, tear, cut, or change the foam block in any way.   MAKE SURE YOU:  Understand these instructions.  Get help right away if you are not doing well or get worse.    Thank you for letting us be a part of your medical care team.  It is a privilege we respect greatly.  We hope these instructions will help you stay on track for a fast and full recovery!   Diagnostic Studies: DG Knee Right Port  Result Date: 12/20/2022 CLINICAL DATA:  Post right total knee arthroplasty. EXAM: PORTABLE RIGHT KNEE - 1-2 VIEW COMPARISON:  None Available. FINDINGS: Postoperative changes with right knee arthroplasty including patellar femoral component. Components appear well seated. Alignment is normal. No evidence of acute fracture or dislocation. No focal bone lesions identified. Soft tissue gas is consistent with recent surgery. IMPRESSION: Postoperative right total knee arthroplasty. Components appear well seated. No acute complication is suggested radiographically. Electronically Signed   By: Burman Nieves M.D.   On: 12/20/2022 12:06    Disposition: Discharge disposition: 01-Home or Self Care       Discharge Instructions     Call MD / Call 911   Complete by: As directed    If you experience chest pain or shortness of breath, CALL 911 and be transported to the hospital emergency room.  If you develope a fever above 101 F, pus (white drainage) or increased drainage or redness at the wound, or calf pain, call your surgeon's office.   Change dressing   Complete by: As directed    Do not remove your dressing.   Constipation Prevention   Complete by: As  directed    Drink plenty of fluids.  Prune juice may be helpful.  You may use a stool softener, such as Colace (over the counter) 100 mg twice a day.  Use MiraLax (over the counter) for constipation as needed.   Diet - low sodium heart healthy   Complete by: As directed    Discharge instructions   Complete by: As directed    Elevate toes above nose. Use cryotherapy as needed for pain and swelling.   Do not put a pillow under the knee. Place it under the heel.   Complete by: As directed    Driving restrictions   Complete by: As directed    No driving for 6 weeks   Increase activity slowly as tolerated   Complete by: As directed    Lifting restrictions   Complete by: As directed    No lifting for 6 weeks   Post-operative opioid taper instructions:   Complete by: As directed    POST-OPERATIVE OPIOID TAPER INSTRUCTIONS: It is important to wean off of your opioid medication as soon as possible. If you do not need pain medication after your surgery it is ok to stop day one. Opioids include: Codeine, Hydrocodone(Norco, Vicodin), Oxycodone(Percocet, oxycontin) and hydromorphone amongst others.  Long term and even short term use of opiods can cause: Increased pain response Dependence Constipation Depression Respiratory depression And more.  Withdrawal symptoms can include Flu like symptoms Nausea, vomiting And more Techniques to manage these symptoms Hydrate well Eat regular healthy meals Stay active Use relaxation techniques(deep breathing, meditating, yoga) Do Not substitute Alcohol to help with tapering If you have been on opioids for less than two weeks and do not have pain than it is ok to stop all together.  Plan to wean off of opioids This plan should start within one week post op of your joint replacement. Maintain the same interval or time between taking each dose and first decrease the dose.  Cut the total daily intake of opioids by one tablet each day Next start to  increase the time between doses. The last dose that should be eliminated is the evening dose.      TED hose   Complete by: As directed    Use stockings (TED hose) for 2 weeks on both leg(s).  You may remove them at night for sleeping.   Weight bearing as tolerated   Complete by: As directed  Follow-up Information     Clois Dupes, [place]. Schedule an appointment as soon as possible for a visit in 2 week(s).   Specialty: Orthopedic Surgery Why: For suture removal, For wound re-check Contact information: [address] [place] [address] [phone]                  Signed: Clois Dupes 12/22/2022, 5:15 PM

## 2023-01-02 ENCOUNTER — Other Ambulatory Visit (HOSPITAL_COMMUNITY): Payer: Self-pay

## 2023-01-08 NOTE — Progress Notes (Signed)
Cardiology Office Note:    Date:  01/09/2023   ID:  Avalynn, [name] 20-Oct-1955, MRN [id number]  PCP:  Deatra James, MD  Cardiologist:  Little Ishikawa, MD  Electrophysiologist:  None   Referring MD: Deatra James, MD   Chief Complaint  Patient presents with   Leg Swelling    History of Present Illness:    [name] is a 67 y.o. female with a hx of hypertension, GERD, OSA who presents for follow-up.  She was referred by Micheline Maze, NP for evaluation of shortness of breath, initially seen 06/16/22.  Underwent Lexiscan Myoview 09/08/2017 which showed no reversible perfusion defect, possible basal septal fixed defect thought to represent attenuation artifact, EF 52%.  She reports she has been having shortness of breath, especially with walking uphill.  She denies any chest pain.  Reports she swims 3 times per week, has had shortness of breath while exerting herself.  Tries to do 30 minutes in the pool but has been limited recently by dyspnea.  Reports some lightheadedness but denies any syncope, attributes to vertigo.  Reports some lower extremity edema.  Reports rare palpitations.  No smoking history.  No history of heart disease in her immediate family.  LDL 68 on 01/16/2022.  Echocardiogram 07/03/2022 showed EF 55 to 60%, normal RV function, mild to moderate mitral regurgitation, mild aortic regurgitation, mild dilatation of ascending aorta measuring 39 mm.  Coronary CTA on 07/04/2022 showed nonobstructive CAD with mild stenosis in proximal LAD, calcium score 87 (81st percentile).  Since last clinic visit, she reports she is doing OK.  Had surgery on right knee earlier this month, having right lower extremity edema.  Denies any chest pain, does report some shortness of breath.  Past Medical History:  Diagnosis Date   Arthritis    Dyspnea    with exertion   Family history of adverse reaction to anesthesia    mother- had problems with knee surgery  but does not remember what   GERD (gastroesophageal reflux disease)    Heart disorder    Heart Muscle Spasms   Heart murmur    as a child   Hypertension    Hypothyroidism    Pancreatic insufficiency    PONV (postoperative nausea and vomiting)    Pre-diabetes    Sleep apnea    cpap    Past Surgical History:  Procedure Laterality Date   CESAREAN SECTION     ESOPHAGOGASTRODUODENOSCOPY (EGD) WITH PROPOFOL N/A 01/01/2013   Procedure: ESOPHAGOGASTRODUODENOSCOPY (EGD) WITH PROPOFOL;  Surgeon: Willis Modena, MD;  Location: [facility];  Service: Endoscopy;  Laterality: N/A;   EUS N/A 01/01/2013   Procedure: ESOPHAGEAL ENDOSCOPIC ULTRASOUND (EUS) RADIAL;  Surgeon: Willis Modena, MD;  Location: [facility];  Service: Endoscopy;  Laterality: N/A;   KNEE ARTHROPLASTY Right 12/20/2022   Procedure: COMPUTER ASSISTED TOTAL KNEE ARTHROPLASTY;  Surgeon: Samson Frederic, MD;  Location: [facility];  Service: Orthopedics;  Laterality: Right;  160   LEFT HEART CATH AND CORONARY ANGIOGRAPHY  2010   NASAL SINUS SURGERY     TUBAL LIGATION  2001    Current Medications: Current Meds  Medication Sig   albuterol (VENTOLIN HFA) 108 (90 Base) MCG/ACT inhaler Inhale 2 puffs into the lungs every 6 (six) hours as needed for wheezing or shortness of breath.   amLODipine (NORVASC) 5 MG tablet Take 5 mg by mouth daily before breakfast.    Ascorbic Acid (VITAMIN C) 1000 MG tablet Take 1,000 mg by mouth daily.  aspirin (ASPIRIN CHILDRENS) 81 MG chewable tablet Chew 1 tablet (81 mg total) by mouth 2 (two) times daily with a meal.   atorvastatin (LIPITOR) 10 MG tablet Take 1 tablet (10 mg total) by mouth daily. (Patient taking differently: Take 10 mg by mouth every evening.)   Azelastine HCl 137 MCG/SPRAY SOLN Place 2 sprays into both nostrils every evening.   Cholecalciferol (VITAMIN D3 MAXIMUM STRENGTH) 125 MCG (5000 UT) capsule Take 5,000 Units by mouth daily.   CREON 36000-114000 units CPEP capsule Take 72,000 Units  by mouth 3 (three) times daily with meals.   docusate sodium (COLACE) 100 MG capsule Take 1 capsule (100 mg total) by mouth 2 (two) times daily.   famotidine (PEPCID) 10 MG tablet Take 10 mg by mouth daily as needed for heartburn.   fluticasone (FLONASE) 50 MCG/ACT nasal spray Place 2 sprays into both nostrils daily.   levothyroxine (SYNTHROID, LEVOTHROID) 50 MCG tablet Take 50 mcg by mouth daily before breakfast.   losartan (COZAAR) 100 MG tablet Take 100 mg by mouth daily before breakfast.    meloxicam (MOBIC) 15 MG tablet Take 1 tablet (15 mg total) by mouth daily.   methocarbamol (ROBAXIN) 500 MG tablet Take 1 tablet (500 mg total) by mouth every 6 (six) hours as needed for muscle spasms.   Multiple Vitamins-Minerals (MULTIVITAMIN WITH MINERALS) tablet Take 1 tablet by mouth daily.   ondansetron (ZOFRAN) 4 MG tablet Take 1 tablet (4 mg total) by mouth every 8 (eight) hours as needed for nausea or vomiting.   polyethylene glycol (MIRALAX) 17 g packet Take 17 g by mouth daily as needed for mild constipation or moderate constipation.   traZODone (DESYREL) 50 MG tablet Take 1/2 to 1 whole tablet by mouth at bedtime as needed for sleep. Do not drive after taking     Allergies:   Tylenol [acetaminophen]   Social History   Socioeconomic History   Marital status: Married    Spouse name: Not on file   Number of children: Not on file   Years of education: Not on file   Highest education level: Not on file  Occupational History   Not on file  Tobacco Use   Smoking status: Never   Smokeless tobacco: Never  Vaping Use   Vaping Use: Never used  Substance and Sexual Activity   Alcohol use: Yes    Comment: occassionally   Drug use: No   Sexual activity: Not on file  Other Topics Concern   Not on file  Social History Narrative   Not on file   Social Determinants of Health   Financial Resource Strain: Not on file  Food Insecurity: No Food Insecurity (12/20/2022)   Hunger Vital Sign     Worried About Running Out of Food in the Last Year: Never true    Ran Out of Food in the Last Year: Never true  Transportation Needs: No Transportation Needs (12/20/2022)   PRAPARE - Administrator, Civil Service (Medical): No    Lack of Transportation (Non-Medical): No  Physical Activity: Not on file  Stress: Not on file  Social Connections: Not on file     Family History: The patient's family history includes Diabetes in her father; Heart disease in her father; Hyperlipidemia in her father.  ROS:   Please see the history of present illness.     All other systems reviewed and are negative.  EKGs/Labs/Other Studies Reviewed:    The following studies were reviewed today:  EKG:   06/16/2022: Normal sinus rhythm, rate 71, Q wave in aVL, poor R wave progression 01/09/2023: Sinus tachycardia, rate 101, nonspecific T wave flattening  Recent Labs: 12/21/2022: BUN 18; Creatinine, Ser 0.79; Hemoglobin 10.1; Platelets 220; Potassium 4.3; Sodium 137  Recent Lipid Panel    Component Value Date/Time   CHOL 152 09/08/2017 0524   TRIG 37 09/08/2017 0524   HDL 57 09/08/2017 0524   CHOLHDL 2.7 09/08/2017 0524   VLDL 7 09/08/2017 0524   LDLCALC 88 09/08/2017 0524    Physical Exam:    VS:  BP 136/78   Pulse (!) 101   Ht 5\' 5"  (1.651 m)   Wt 222 lb 12.8 oz (101.1 kg)   SpO2 97%   BMI 37.08 kg/m     Wt Readings from Last 3 Encounters:  01/09/23 222 lb 12.8 oz (101.1 kg)  12/20/22 184 lb (83.5 kg)  12/07/22 184 lb (83.5 kg)     GEN:  Well nourished, well developed in no acute distress HEENT: Normal NECK: No JVD; No carotid bruits LYMPHATICS: No lymphadenopathy CARDIAC: RRR, no murmurs, rubs, gallops RESPIRATORY:  Clear to auscultation without rales, wheezing or rhonchi  ABDOMEN: Soft, non-tender, non-distended MUSCULOSKELETAL:  No edema; No deformity  SKIN: Warm and dry NEUROLOGIC:  Alert and oriented x 3 PSYCHIATRIC:  Normal affect   ASSESSMENT:    1. Lower leg  edema   2. CAD in native artery   3. Hyperlipidemia, unspecified hyperlipidemia type   4. Morbid obesity (HCC)     PLAN:    CAD: Reported dyspnea on exertion.  Echocardiogram 07/03/2022 showed EF 55 to 60%, normal RV function, mild to moderate mitral regurgitation, mild aortic regurgitation, mild dilatation of ascending aorta measuring 39 mm.  Coronary CTA on 07/04/2022 showed nonobstructive CAD with mild stenosis in proximal LAD, calcium score 87 (81st percentile). -Continue atorvastatin 10 mg daily  Right lower extremity edema: 1+ right lower extremity edema, in setting of recent knee surgery.  Check lower extremity duplex to rule out DVT  Mild regurgitation: Mild to moderate on echocardiogram 06/2022.  Will monitor  Hypertension: On losartan 100 mg daily and amlodipine 5 mg daily. Appears controlled  Prediabetes: A1c 6.3% on 08/22/2022  OSA: on CPAP, reports compliance  Morbid obesity: Body mass index is 37.08 kg/m.  Diet/exercise encouraged  Hyperlipidemia: LDL 99 on 08/22/2022.  Goal LDL less than 70 given CAD as above.  Started atorvastatin 10 mg daily, will check lipid panel   RTC in 3 months   Medication Adjustments/Labs and Tests Ordered: Current medicines are reviewed at length with the patient today.  Concerns regarding medicines are outlined above.  Orders Placed This Encounter  Procedures   Lipid panel   EKG 12-Lead   VAS [place] LOWER EXTREMITY VENOUS (DVT)   No orders of the defined types were placed in this encounter.   Patient Instructions  Medication Instructions:  Your physician recommends that you continue on your current medications as directed. Please refer to the Current Medication list given to you today.   *If you need a refill on your cardiac medications before your next appointment, please call your pharmacy*   Lab Work: Please return for FASTING labs (Lipid)  Our in office lab hours are Monday-Friday 8:00-4:00, closed for lunch 1-2 pm.  No  appointment needed.  [facility] locations:   [facility] - [address]  - [address] ([facility]) - [address] [phone]  [address]   [place] - [address]    [place]  - [address]    [place] - [address] - [address] [employer]  - [address] - [address] Chief Technology Officer)   Testing/Procedures: RLE venous ultrasound (R/O DVT) ASAP  Follow-Up: At [facility], you and your health needs are our priority.  As part of our continuing mission to provide you with exceptional heart care, we have created designated Provider Care Teams.  These Care Teams include your primary Cardiologist (physician) and Advanced Practice Providers (APPs -  Physician Assistants and Nurse Practitioners) who all work together to provide you with the care you need, when you need it.  We recommend signing up for the patient portal called "MyChart".  Sign up information is provided on this After Visit Summary.  MyChart is used to connect with patients for Virtual Visits (Telemedicine).  Patients are able to view lab/test results, encounter notes, upcoming appointments, etc.  Non-urgent messages can be sent to your provider as well.   To learn more about what you can do with MyChart, go to [email or web address].    Your next appointment:   As scheduled with Dr. Bjorn Pippin     Signed, Little Ishikawa, MD  01/09/2023 8:44 AM    [facility]

## 2023-01-09 ENCOUNTER — Encounter: Payer: Self-pay | Admitting: Cardiology

## 2023-01-09 ENCOUNTER — Ambulatory Visit: Payer: Medicare Other | Attending: Cardiology | Admitting: Cardiology

## 2023-01-09 VITALS — BP 136/78 | HR 101 | Ht 65.0 in | Wt 222.8 lb

## 2023-01-09 DIAGNOSIS — R600 Localized edema: Secondary | ICD-10-CM | POA: Diagnosis not present

## 2023-01-09 DIAGNOSIS — E785 Hyperlipidemia, unspecified: Secondary | ICD-10-CM

## 2023-01-09 DIAGNOSIS — I2510 Atherosclerotic heart disease of native coronary artery without angina pectoris: Secondary | ICD-10-CM | POA: Diagnosis not present

## 2023-01-09 NOTE — Patient Instructions (Signed)
Medication Instructions:  Your physician recommends that you continue on your current medications as directed. Please refer to the Current Medication list given to you today.   *If you need a refill on your cardiac medications before your next appointment, please call your pharmacy*   Lab Work: Please return for FASTING labs (Lipid)  Our in office lab hours are Monday-Friday 8:00-4:00, closed for lunch 1-2 pm.  No appointment needed.  [facility] locations:   [facility] - [address]  - [address] ([facility]) - [address] [phone] [address]   [place] - [address]    [place]  - [address]    [place] - [address] - [address] [employer]  - [address] [place] - [address] Chief Technology Officer)   Testing/Procedures: RLE venous ultrasound (R/O DVT) ASAP  Follow-Up: At [facility], you and your health needs are our priority.  As part of our continuing mission to provide you with exceptional heart care, we have created designated Provider Care Teams.  These Care Teams include your primary Cardiologist (physician) and Advanced Practice Providers (APPs -  Physician Assistants and Nurse Practitioners) who all work together to provide you with the care you need, when you need it.  We recommend signing up for the patient portal called "MyChart".  Sign up information is provided on this After Visit Summary.  MyChart is used to connect with patients for Virtual Visits (Telemedicine).  Patients are able to view lab/test results, encounter notes, upcoming appointments, etc.  Non-urgent messages can be sent to your provider as well.   To learn more about what you can do with MyChart, go to [email or web address].    Your next appointment:   As scheduled with Dr. Bjorn Pippin

## 2023-01-16 ENCOUNTER — Ambulatory Visit (HOSPITAL_COMMUNITY)
Admission: RE | Admit: 2023-01-16 | Discharge: 2023-01-16 | Disposition: A | Discharge status: Home / Self Care | Payer: Medicare Other | Source: Ambulatory Visit | Attending: Cardiology | Admitting: Cardiology

## 2023-01-16 DIAGNOSIS — R600 Localized edema: Secondary | ICD-10-CM

## 2023-01-29 ENCOUNTER — Telehealth: Payer: Self-pay | Admitting: Student

## 2023-01-29 NOTE — Telephone Encounter (Signed)
[facility] in [place] [phone] for [place] in billing.   [place] in billing is asking her for over 600.00 out of pocket but she is insured w/Medicare. She changed to [facility] in January w/a [facility] supplement.   How can she get this rectified.  Billed for Feb Mar and April  Acct # is [id number]  Please call PT to advise action taken or advise needed.   Inv #'s Feb is [id number] Mar is [id number] April is [id number]

## 2023-01-30 NOTE — Telephone Encounter (Signed)
Patient needs to call the Homecare company this office can't fix this that is all done through the home care company

## 2023-01-30 NOTE — Telephone Encounter (Signed)
Called PT and let her know to sort out with biller.

## 2023-03-07 ENCOUNTER — Ambulatory Visit (INDEPENDENT_AMBULATORY_CARE_PROVIDER_SITE_OTHER): Payer: Medicare Other | Admitting: Nurse Practitioner

## 2023-03-07 ENCOUNTER — Encounter: Payer: Self-pay | Admitting: Nurse Practitioner

## 2023-03-07 VITALS — BP 144/90 | HR 90 | Ht 61.0 in | Wt 218.0 lb

## 2023-03-07 DIAGNOSIS — G4733 Obstructive sleep apnea (adult) (pediatric): Secondary | ICD-10-CM

## 2023-03-07 DIAGNOSIS — R0602 Shortness of breath: Secondary | ICD-10-CM | POA: Diagnosis not present

## 2023-03-07 DIAGNOSIS — M1711 Unilateral primary osteoarthritis, right knee: Secondary | ICD-10-CM

## 2023-03-07 NOTE — Assessment & Plan Note (Signed)
Mild restrictive defect with normal DLCO, likely related to obesity. She is working on healthy weight loss measures. Has not required SABA recently.

## 2023-03-07 NOTE — Patient Instructions (Signed)
Continue CPAP auto 5-20 cmH2O every night, minimum of 4-6 hours a night.  Be aware of reduced alertness and do not drive or operate heavy machinery if experiencing this or drowsiness.    Continue Trazodone 25-50 mg (1/2 tab to 1 tab) At bedtime as needed for sleep. Take 30 minutes prior to bedtime. Put your CPAP on within 15 minutes after taking to ensure you don't fall asleep without it on. Do not drive after taking.   Continue Albuterol inhaler 2 puffs every 6 hours as needed for shortness of breath or wheezing. Notify if symptoms persist despite rescue inhaler/neb use.    Exercise 150 min/week   Follow up in 6 months with Dr. Wynona Neat or Rhunette Croft NP. If symptoms worsen, please contact office for sooner follow up or seek emergency care.

## 2023-03-07 NOTE — Assessment & Plan Note (Signed)
Recovering well post right TKA. Follow up with ortho as scheduled

## 2023-03-07 NOTE — Assessment & Plan Note (Addendum)
Severe OSA on CPAP. Excellent control and compliance. She is receiving benefit from use. She has done very well with therapy and adjusted easily. She understands proper care/use of device. Aware of safe driving practices. Moderate leaks; not bothersome and residual AHI remains low so will hold off on further adjustment. Healthy weight loss encouraged.   Patient Instructions  Continue CPAP auto 5-20 cmH2O every night, minimum of 4-6 hours a night.  Be aware of reduced alertness and do not drive or operate heavy machinery if experiencing this or drowsiness.    Continue Trazodone 25-50 mg (1/2 tab to 1 tab) At bedtime as needed for sleep. Take 30 minutes prior to bedtime. Put your CPAP on within 15 minutes after taking to ensure you don't fall asleep without it on. Do not drive after taking.   Continue Albuterol inhaler 2 puffs every 6 hours as needed for shortness of breath or wheezing. Notify if symptoms persist despite rescue inhaler/neb use.    Exercise 150 min/week   Follow up in 6 months with Dr. Wynona Neat or Rhunette Croft NP. If symptoms worsen, please contact office for sooner follow up or seek emergency care.

## 2023-03-07 NOTE — Progress Notes (Signed)
@Patient  ID: [name], female    DOB: 1955/10/09, 67 y.o.   MRN: [id number]  Chief Complaint  Patient presents with   Follow-up    Cpap f/u     Referring provider: Deatra James, MD  HPI: 67 year old female, never smoker followed for OSA. She is a patient of Dr. Trena Platt and last seen in office on 06/08/2022 by [facility] NP. Past medical history significant for HTN, GERD, allergic rhinitis, pancreatic insufficiency.   TEST/EVENTS:  10/12/2019 CTA chest: no PE. No LAD. Lungs are clear.  02/15/2022 HST: AHI 45.9, SpO2 low 80% Severe OSA 06/01/2022 PFT: FVC 78, FEV1 83, ratio 85, TLC 94, DLCO 92.  No BD  10/24/2021: OV with Dr. Wynona Neat. Seen 2 years ago but unable to complete sleep study at the time. Has issues with sleep and snoring. Sleep nonrestorative. Weight stable. Concerned for OSA - HST ordered.   03/08/2022: OV with Suleika Donavan NP to discuss sleep study results which showed severe obstructive sleep apnea. She continues to have daytime fatigue symptoms and feels like her sleep is not restful. Wakes in the morning feeling groggy and with a dry mouth. She denies any sleep parasomnias, morning headaches, or drowsy driving. She is hoping to get started on CPAP therapy - order sent to DME for new start auto CPAP 5-20 cmH2O.   03/29/2022: OV with Lamanda Rudder NP to discuss issues with her CPAP/sleep. She received her mask last week. She is having some trouble adjusting to wearing her CPAP. Feels like she gets distracted by the mask and her mind is all over the place as she's trying to fall asleep. She has started turning on the TV for a few minutes after she puts her mask on, which has helped some. She has only worn it three nights, one of which was for a little over 4 hours. She did notice a difference in how well she slept and how she felt more rested after that night versus others. She also is concerned that her mask may be too tight. She denies any morning headaches, sleep parasomnias, or drowsy  driving. She doesn't feel like she's having any significant mask leaks.   05/31/2022: OV with Dayane Hillenburg NP for follow up. Since I saw her last, she has been doing significantly better with her CPAP. She is wearing it nightly. Feels like she sleeps much better now. She occasionally uses the trazodone to help her fall asleep. She's not having any issues with her machine; occasionally notices a little leak. Wakes feeling better rested. Denies morning headaches or drowsy driving.  Excellent compliance.  Residual AHI 0.7 She has been struggling with some shortness of breath recently. She says that it's been going on for months to years now. She associated it more so with her weight but now is starting to get concerned. Usually only occurs with exertion but she will occasionally feel like she can't get a big deep breath in at rest. Rarely has some wheezing. She denies any cough or chest congestion. She does occasionally have associated dizziness and palpitations. She denies orthopnea, PND or chest pain. She has seen a cardiologist years ago; would like to go back. She has not pulmonary history; denies any childhood asthma. No autoimmune diseases. No significant environmental or occupational exposures. Never tried inhalers.  Referred to cardiology.  PFTs ordered for further evaluation.  Provided with trial of albuterol as needed.  06/08/2022: OV with Dorman Calderwood NP for follow-up to discuss pulmonary function testing.  PFTs were overall  unremarkable.  She did have a very mild restriction with FVC 78%; however, TLC remain normal.  She tells me today that she has been feeling pretty well since I saw her last.  She does feel like her shortness of breath is somewhat better and still only occurs with exertion.  She has used her albuterol a few times, feels like it provides some relief. She's only used it a handful of times. No significant cough, chest congestion, or wheezing. Denies orthopnea, PND, syncope. She still has some  occasional palpitations, usually with exertion. Going to see cardiology next week. She does want to work on weight loss measures; thinks this is the cause of her getting winded.  Wearing CPAP nightly. Feels much better using it. Fatigue has significantly improved. Denies morning headaches or drowsy driving.   1/61/0960: Today - follow up Patient presents today for follow up. She had knee surgery about 2 months ago. Recovering well but having some tightness that has been slow to resolve. She is seeing her surgeon Tuesday. From a breathing standpoint, she has been doing well. Hasn't required her rescue inhaler.  She is sleeping with her CPAP nightly. She feels like this has been life changing for her. She's able to sleep through the night, most nights, and wakes feeling rested. Energy levels are better. She will occasionally take 1/2-1 tab of trazodone, which helps her shut off her brain and fall asleep. Doesn't require this every night. No mood changes or side effects with this. No residual AM grogginess. She denies any sleep parasomnias/paralysis or drowsy driving. Still having some leaks but they don't tend to bother her.   02/05/2023-03/06/2023: CPAP 5-20 cmH2O 30/30 days; 100% > 4 hr; average use 9 hr 26 min Pressure 95th 13.3 Leaks 95th 35.9 AHI 1.3  Allergies  Allergen Reactions   Tylenol [Acetaminophen] Other (See Comments)    Makes her feel jumpy.    Immunization History  Administered Date(s) Administered   PFIZER(Purple Top)SARS-COV-2 Vaccination 11/25/2019, 05/05/2020   Tdap 06/28/2016, 08/14/2016   Zoster, Live 06/22/2011    Past Medical History:  Diagnosis Date   Arthritis    Dyspnea    with exertion   Family history of adverse reaction to anesthesia    mother- had problems with knee surgery but does not remember what   GERD (gastroesophageal reflux disease)    Heart disorder    Heart Muscle Spasms   Heart murmur    as a child   Hypertension    Hypothyroidism     Pancreatic insufficiency    PONV (postoperative nausea and vomiting)    Pre-diabetes    Sleep apnea    cpap    Tobacco History: Social History   Tobacco Use  Smoking Status Never  Smokeless Tobacco Never   Counseling given: Not Answered   Outpatient Medications Prior to Visit  Medication Sig Dispense Refill   albuterol (VENTOLIN HFA) 108 (90 Base) MCG/ACT inhaler Inhale 2 puffs into the lungs every 6 (six) hours as needed for wheezing or shortness of breath. 8 g 6   amLODipine (NORVASC) 5 MG tablet Take 5 mg by mouth daily before breakfast.      Ascorbic Acid (VITAMIN C) 1000 MG tablet Take 1,000 mg by mouth daily.     atorvastatin (LIPITOR) 10 MG tablet Take 1 tablet (10 mg total) by mouth daily. (Patient taking differently: Take 10 mg by mouth every evening.) 90 tablet 3   Azelastine HCl 137 MCG/SPRAY SOLN Place 2 sprays into both  nostrils every evening.     Cholecalciferol (VITAMIN D3 MAXIMUM STRENGTH) 125 MCG (5000 UT) capsule Take 5,000 Units by mouth daily.     CREON 36000-114000 units CPEP capsule Take 72,000 Units by mouth 3 (three) times daily with meals.     famotidine (PEPCID) 10 MG tablet Take 10 mg by mouth daily as needed for heartburn.     fluticasone (FLONASE) 50 MCG/ACT nasal spray Place 2 sprays into both nostrils daily.     levothyroxine (SYNTHROID, LEVOTHROID) 50 MCG tablet Take 50 mcg by mouth daily before breakfast.     losartan (COZAAR) 100 MG tablet Take 100 mg by mouth daily before breakfast.      meloxicam (MOBIC) 15 MG tablet Take 1 tablet (15 mg total) by mouth daily. (Patient taking differently: Take 15 mg by mouth daily. Prn) 30 tablet 2   methocarbamol (ROBAXIN) 500 MG tablet Take 1 tablet (500 mg total) by mouth every 6 (six) hours as needed for muscle spasms. (Patient taking differently: Take 500 mg by mouth every 6 (six) hours as needed for muscle spasms. Prn) 20 tablet 0   Multiple Vitamins-Minerals (MULTIVITAMIN WITH MINERALS) tablet Take 1 tablet  by mouth daily.     ondansetron (ZOFRAN) 4 MG tablet Take 1 tablet (4 mg total) by mouth every 8 (eight) hours as needed for nausea or vomiting. (Patient taking differently: Take 4 mg by mouth every 8 (eight) hours as needed for nausea or vomiting. Prn) 30 tablet 0   traZODone (DESYREL) 50 MG tablet Take 1/2 to 1 whole tablet by mouth at bedtime as needed for sleep. Do not drive after taking 90 tablet 2   No facility-administered medications prior to visit.     Review of Systems:   Constitutional: No weight loss or gain, night sweats, fevers, chills, lassitude, fatigue  HEENT: No headaches, difficulty swallowing, tooth/dental problems, or sore throat. No sneezing, itching, ear ache, nasal congestion, or post nasal drip.  CV:  +mild BLE swelling (baseline). No chest pain, orthopnea, PND, anasarca, syncope, palpitations, dizziness  Resp: +shortness of breath with exertion (minimal; improved). No wheezing. No excess mucus or change in color of mucus. No productive or non-productive. No hemoptysis. No chest wall deformity Skin: No rash, lesions, ulcerations MSK:  +right knee pain/swelling (improving). No decreased range of motion.  No back pain. Neuro: No gait abnormalities, weakness, numbness/tingling  Psych: No depression or anxiety. Mood stable.     Physical Exam:  BP (!) 144/90   Pulse 90   Ht 5\' 1"  (1.549 m)   Wt 218 lb (98.9 kg)   SpO2 97%   BMI 41.19 kg/m   GEN: Pleasant, interactive, well-appearing; obese; in no acute distress. HEENT:  Normocephalic and atraumatic. PERRLA. Sclera white. Nasal turbinates pink, moist and patent bilaterally. No rhinorrhea present. Oropharynx pink and moist, without exudate or edema. No lesions, ulcerations, or postnasal drip.  NECK:  Supple w/ fair ROM. No JVD present. Normal carotid impulses w/o bruits. Thyroid symmetrical with no goiter or nodules palpated. No lymphadenopathy.   CV: RRR, no m/r/g, no peripheral edema. Pulses intact, +2  bilaterally. No cyanosis, pallor or clubbing. PULMONARY:  Unlabored, regular breathing. Clear bilaterally A&P w/o wheezes/rales/rhonchi. No accessory muscle use. No dullness to percussion. GI: BS present and normoactive. Soft, non-tender to palpation. No organomegaly or masses detected.  MSK: No erythema, warmth or tenderness. Well healed right knee incision Neuro: A/Ox3. No focal deficits noted.   Skin: Warm, no lesions or rashe Psych: Normal affect  and behavior. Judgement and thought content appropriate.     Lab Results:  CBC    Component Value Date/Time   WBC 14.1 (H) 12/21/2022 0345   RBC 3.93 12/21/2022 0345   HGB 10.1 (L) 12/21/2022 0345   HCT 31.4 (L) 12/21/2022 0345   PLT 220 12/21/2022 0345   MCV 79.9 (L) 12/21/2022 0345   MCH 25.7 (L) 12/21/2022 0345   MCHC 32.2 12/21/2022 0345   RDW 14.9 12/21/2022 0345   LYMPHSABS 1.8 10/04/2021 1636   MONOABS 0.5 10/04/2021 1636   EOSABS 0.1 10/04/2021 1636   BASOSABS 0.1 10/04/2021 1636    BMET    Component Value Date/Time   NA 137 12/21/2022 0345   NA 140 06/16/2022 0948   K 4.3 12/21/2022 0345   CL 106 12/21/2022 0345   CO2 24 12/21/2022 0345   GLUCOSE 141 (H) 12/21/2022 0345   BUN 18 12/21/2022 0345   BUN 20 06/16/2022 0948   CREATININE 0.79 12/21/2022 0345   CREATININE 0.73 10/21/2011 1238   CALCIUM 8.4 (L) 12/21/2022 0345   GFRNONAA >60 12/21/2022 0345   GFRAA >60 05/08/2020 0625    BNP    Component Value Date/Time   BNP 26.0 12/09/2019 1322     Imaging:  No results found.  Administration History     None          Latest Ref Rng & Units 06/01/2022    3:49 PM  PFT Results  FVC-Pre L 2.22   FVC-Predicted Pre % 78   FVC-Post L 2.09   FVC-Predicted Post % 73   Pre FEV1/FVC % % 81   Post FEV1/FCV % % 85   FEV1-Pre L 1.81   FEV1-Predicted Pre % 83   FEV1-Post L 1.79   DLCO uncorrected ml/min/mmHg 16.56   DLCO UNC% % 92   DLCO corrected ml/min/mmHg 16.56   DLCO COR %Predicted % 92   DLVA  Predicted % 103   TLC L 4.37   TLC % Predicted % 94   RV % Predicted % 108     No results found for: "NITRICOXIDE"      Assessment & Plan:   Severe obstructive sleep apnea Severe OSA on CPAP. Excellent control and compliance. She is receiving benefit from use. She has done very well with therapy and adjusted easily. She understands proper care/use of device. Aware of safe driving practices. Moderate leaks; not bothersome and residual AHI remains low so will hold off on further adjustment. Healthy weight loss encouraged.   Patient Instructions  Continue CPAP auto 5-20 cmH2O every night, minimum of 4-6 hours a night.  Be aware of reduced alertness and do not drive or operate heavy machinery if experiencing this or drowsiness.    Continue Trazodone 25-50 mg (1/2 tab to 1 tab) At bedtime as needed for sleep. Take 30 minutes prior to bedtime. Put your CPAP on within 15 minutes after taking to ensure you don't fall asleep without it on. Do not drive after taking.   Continue Albuterol inhaler 2 puffs every 6 hours as needed for shortness of breath or wheezing. Notify if symptoms persist despite rescue inhaler/neb use.    Exercise 150 min/week   Follow up in 6 months with Dr. Wynona Neat or Rhunette Croft NP. If symptoms worsen, please contact office for sooner follow up or seek emergency care.    Shortness of breath Mild restrictive defect with normal DLCO, likely related to obesity. She is working on healthy weight loss measures. Has not  required SABA recently.    Osteoarthritis of right knee Recovering well post right TKA. Follow up with ortho as scheduled      I spent 28 minutes of dedicated to the care of this patient on the date of this encounter to include pre-visit review of records, face-to-face time with the patient discussing conditions above, post visit ordering of testing, clinical documentation with the electronic health record, making appropriate referrals as documented, and  communicating necessary findings to members of the patients care team.  Noemi Chapel, NP 03/07/2023  Pt aware and understands NP's role.

## 2023-04-08 NOTE — Progress Notes (Deleted)
Cardiology Office Note:    Date:  04/08/2023   ID:  [name], DOB [birth year], MRN [id number]  PCP:  Deatra James, MD  Cardiologist:  Little Ishikawa, MD  Electrophysiologist:  None   Referring MD: Deatra James, MD   No chief complaint on file.   History of Present Illness:    [name] is a 67 y.o. female with a hx of hypertension, GERD, OSA who presents for follow-up.  She was referred by Micheline Maze, NP for evaluation of shortness of breath, initially seen 06/16/22.  Underwent Lexiscan Myoview 09/08/2017 which showed no reversible perfusion defect, possible basal septal fixed defect thought to represent attenuation artifact, EF 52%.  She reports she has been having shortness of breath, especially with walking uphill.  She denies any chest pain.  Reports she swims 3 times per week, has had shortness of breath while exerting herself.  Tries to do 30 minutes in the pool but has been limited recently by dyspnea.  Reports some lightheadedness but denies any syncope, attributes to vertigo.  Reports some lower extremity edema.  Reports rare palpitations.  No smoking history.  No history of heart disease in her immediate family.  LDL 68 on 01/16/2022.  Echocardiogram 07/03/2022 showed EF 55 to 60%, normal RV function, mild to moderate mitral regurgitation, mild aortic regurgitation, mild dilatation of ascending aorta measuring 39 mm.  Coronary CTA on 07/04/2022 showed nonobstructive CAD with mild stenosis in proximal LAD, calcium score 87 (81st percentile).  Since last clinic visit,  she reports she is doing OK.  Had surgery on right knee earlier this month, having right lower extremity edema.  Denies any chest pain, does report some shortness of breath.  Past Medical History:  Diagnosis Date   Arthritis    Dyspnea    with exertion   Family history of adverse reaction to anesthesia    mother- had problems with knee surgery but does not remember what    GERD (gastroesophageal reflux disease)    Heart disorder    Heart Muscle Spasms   Heart murmur    as a child   Hypertension    Hypothyroidism    Pancreatic insufficiency    PONV (postoperative nausea and vomiting)    Pre-diabetes    Sleep apnea    cpap    Past Surgical History:  Procedure Laterality Date   CESAREAN SECTION     ESOPHAGOGASTRODUODENOSCOPY (EGD) WITH PROPOFOL N/A 01/01/2013   Procedure: ESOPHAGOGASTRODUODENOSCOPY (EGD) WITH PROPOFOL;  Surgeon: Willis Modena, MD;  Location: [facility];  Service: Endoscopy;  Laterality: N/A;   EUS N/A 01/01/2013   Procedure: ESOPHAGEAL ENDOSCOPIC ULTRASOUND (EUS) RADIAL;  Surgeon: Willis Modena, MD;  Location: [facility];  Service: Endoscopy;  Laterality: N/A;   KNEE ARTHROPLASTY Right 12/20/2022   Procedure: COMPUTER ASSISTED TOTAL KNEE ARTHROPLASTY;  Surgeon: Samson Frederic, MD;  Location: [facility];  Service: Orthopedics;  Laterality: Right;  160   LEFT HEART CATH AND CORONARY ANGIOGRAPHY  2010   NASAL SINUS SURGERY     TUBAL LIGATION  2001    Current Medications: No outpatient medications have been marked as taking for the 04/10/23 encounter (Appointment) with Little Ishikawa, MD.     Allergies:   Tylenol [acetaminophen]   Social History   Socioeconomic History   Marital status: Married    Spouse name: Not on file   Number of children: Not on file   Years of education: Not on file   Highest education level: Not  on file  Occupational History   Not on file  Tobacco Use   Smoking status: Never   Smokeless tobacco: Never  Vaping Use   Vaping status: Never Used  Substance and Sexual Activity   Alcohol use: Yes    Comment: occassionally   Drug use: No   Sexual activity: Not on file  Other Topics Concern   Not on file  Social History Narrative   Not on file   Social Determinants of Health   Financial Resource Strain: Not on file  Food Insecurity: No Food Insecurity (12/20/2022)   Hunger Vital Sign     Worried About Running Out of Food in the Last Year: Never true    Ran Out of Food in the Last Year: Never true  Transportation Needs: No Transportation Needs (12/20/2022)   PRAPARE - Administrator, Civil Service (Medical): No    Lack of Transportation (Non-Medical): No  Physical Activity: Not on file  Stress: Not on file  Social Connections: Not on file     Family History: The patient's family history includes Diabetes in her father; Heart disease in her father; Hyperlipidemia in her father.  ROS:   Please see the history of present illness.     All other systems reviewed and are negative.  EKGs/Labs/Other Studies Reviewed:    The following studies were reviewed today:   EKG:   06/16/2022: Normal sinus rhythm, rate 71, Q wave in aVL, poor R wave progression 01/09/2023: Sinus tachycardia, rate 101, nonspecific T wave flattening  Recent Labs: 12/21/2022: BUN 18; Creatinine, Ser 0.79; Hemoglobin 10.1; Platelets 220; Potassium 4.3; Sodium 137  Recent Lipid Panel    Component Value Date/Time   CHOL 152 09/08/2017 0524   TRIG 37 09/08/2017 0524   HDL 57 09/08/2017 0524   CHOLHDL 2.7 09/08/2017 0524   VLDL 7 09/08/2017 0524   LDLCALC 88 09/08/2017 0524    Physical Exam:    VS:  There were no vitals taken for this visit.    Wt Readings from Last 3 Encounters:  03/07/23 218 lb (98.9 kg)  01/09/23 222 lb 12.8 oz (101.1 kg)  12/20/22 184 lb (83.5 kg)     GEN:  Well nourished, well developed in no acute distress HEENT: Normal NECK: No JVD; No carotid bruits LYMPHATICS: No lymphadenopathy CARDIAC: RRR, no murmurs, rubs, gallops RESPIRATORY:  Clear to auscultation without rales, wheezing or rhonchi  ABDOMEN: Soft, non-tender, non-distended MUSCULOSKELETAL:  No edema; No deformity  SKIN: Warm and dry NEUROLOGIC:  Alert and oriented x 3 PSYCHIATRIC:  Normal affect   ASSESSMENT:    No diagnosis found.   PLAN:    CAD: Reported dyspnea on exertion.   Echocardiogram 07/03/2022 showed EF 55 to 60%, normal RV function, mild to moderate mitral regurgitation, mild aortic regurgitation, mild dilatation of ascending aorta measuring 39 mm.  Coronary CTA on 07/04/2022 showed nonobstructive CAD with mild stenosis in proximal LAD, calcium score 87 (81st percentile). -Continue atorvastatin 10 mg daily  Right lower extremity edema: 1+ right lower extremity edema following the surgery.  Lower extremity duplex showed no evidence of DVT 01/2023  Mild regurgitation: Mild to moderate on echocardiogram 06/2022.  Will monitor  Hypertension: On losartan 100 mg daily and amlodipine 5 mg daily. Appears controlled  Prediabetes: A1c 6.3% on 08/22/2022  OSA: on CPAP, reports compliance  Morbid obesity: There is no height or weight on file to calculate BMI.  Diet/exercise encouraged  Hyperlipidemia: LDL 99 on 08/22/2022.  Goal  LDL less than 70 given CAD as above.  Started atorvastatin 10 mg daily, will check lipid panel***   RTC in 3 months***   Medication Adjustments/Labs and Tests Ordered: Current medicines are reviewed at length with the patient today.  Concerns regarding medicines are outlined above.  No orders of the defined types were placed in this encounter.  No orders of the defined types were placed in this encounter.   There are no Patient Instructions on file for this visit.   Signed, Little Ishikawa, MD  04/08/2023 10:11 PM    [facility]

## 2023-04-10 ENCOUNTER — Ambulatory Visit: Payer: BLUE CROSS/BLUE SHIELD | Admitting: Cardiology

## 2023-04-10 ENCOUNTER — Ambulatory Visit: Payer: Medicare Other | Admitting: Cardiology

## 2023-04-17 ENCOUNTER — Ambulatory Visit: Payer: Medicare Other | Admitting: Pulmonary Disease

## 2023-04-25 ENCOUNTER — Other Ambulatory Visit: Payer: Self-pay | Admitting: Gastroenterology

## 2023-04-25 DIAGNOSIS — R1033 Periumbilical pain: Secondary | ICD-10-CM

## 2023-05-10 ENCOUNTER — Ambulatory Visit
Admission: RE | Admit: 2023-05-10 | Discharge: 2023-05-10 | Disposition: A | Discharge status: Home / Self Care | Payer: Medicare Other | Source: Ambulatory Visit | Attending: Gastroenterology | Admitting: Gastroenterology

## 2023-05-10 DIAGNOSIS — R1033 Periumbilical pain: Secondary | ICD-10-CM

## 2023-05-10 MED ORDER — IOPAMIDOL (ISOVUE-300) INJECTION 61%
200.0000 mL | Freq: Once | INTRAVENOUS | Status: AC | PRN
  Administered 2023-05-10: 100 mL via INTRAVENOUS

## 2023-05-18 ENCOUNTER — Encounter: Payer: Self-pay | Admitting: Cardiology

## 2023-05-18 ENCOUNTER — Ambulatory Visit: Payer: Medicare Other | Attending: Cardiology | Admitting: Cardiology

## 2023-05-18 VITALS — BP 150/96 | HR 81 | Ht 61.0 in | Wt 219.0 lb

## 2023-05-18 DIAGNOSIS — R600 Localized edema: Secondary | ICD-10-CM | POA: Diagnosis present

## 2023-05-18 DIAGNOSIS — E785 Hyperlipidemia, unspecified: Secondary | ICD-10-CM | POA: Diagnosis present

## 2023-05-18 DIAGNOSIS — I2510 Atherosclerotic heart disease of native coronary artery without angina pectoris: Secondary | ICD-10-CM | POA: Diagnosis present

## 2023-05-18 DIAGNOSIS — I10 Essential (primary) hypertension: Secondary | ICD-10-CM | POA: Diagnosis present

## 2023-05-18 MED ORDER — AMLODIPINE BESYLATE 10 MG PO TABS: 10.0000 mg | ORAL_TABLET | Freq: Every day | ORAL | 3 refills | Status: DC

## 2023-05-18 NOTE — Patient Instructions (Signed)
Medication Instructions:  Increase Amlodipine to 10 mg ( Take 1 Tablet Daily). *If you need a refill on your cardiac medications before your next appointment, please call your pharmacy*   Lab Work: Lipid Panel . To Be Done In 2 Months. If you have labs (blood work) drawn today and your tests are completely normal, you will receive your results only by: MyChart Message (if you have MyChart) OR A paper copy in the mail If you have any lab test that is abnormal or we need to change your treatment, we will call you to review the results.   Testing/Procedures:[address], [address]. (April 2025) Your physician has requested that you have an echocardiogram. Echocardiography is a painless test that uses sound waves to create images of your heart. It provides your doctor with information about the size and shape of your heart and how well your heart's chambers and valves are working. This procedure takes approximately one hour. There are no restrictions for this procedure. Please do NOT wear cologne, perfume, aftershave, or lotions (deodorant is allowed). Please arrive 15 minutes prior to your appointment time.    Follow-Up: At [facility], you and your health needs are our priority.  As part of our continuing mission to provide you with exceptional heart care, we have created designated Provider Care Teams.  These Care Teams include your primary Cardiologist (physician) and Advanced Practice Providers (APPs -  Physician Assistants and Nurse Practitioners) who all work together to provide you with the care you need, when you need it.  We recommend signing up for the patient portal called "MyChart".  Sign up information is provided on this After Visit Summary.  MyChart is used to connect with patients for Virtual Visits (Telemedicine).  Patients are able to view lab/test results, encounter notes, upcoming appointments, etc.  Non-urgent messages can be sent to your provider as  well.   To learn more about what you can do with MyChart, go to [email or web address].    Your next appointment:   6 month(s) (Post Echocardiogram).  Provider:   Little Ishikawa, MD     Other Instructions Log Blood Pressure Daily for 1 week. Call Office with readings.

## 2023-05-18 NOTE — Progress Notes (Signed)
Cardiology Office Note:    Date:  05/18/2023   ID:  [name], [name] 21-Dec-1955, MRN [id number]  PCP:  Deatra James, MD  Cardiologist:  Little Ishikawa, MD  Electrophysiologist:  None   Referring MD: Deatra James, MD   Chief Complaint  Patient presents with   Coronary Artery Disease    History of Present Illness:    [name] is a 67 y.o. female with a hx of hypertension, GERD, OSA who presents for follow-up.  She was referred by Micheline Maze, NP for evaluation of shortness of breath, initially seen 06/16/22.  Underwent Lexiscan Myoview 09/08/2017 which showed no reversible perfusion defect, possible basal septal fixed defect thought to represent attenuation artifact, EF 52%.  She reports she has been having shortness of breath, especially with walking uphill.  She denies any chest pain.  Reports she swims 3 times per week, has had shortness of breath while exerting herself.  Tries to do 30 minutes in the pool but has been limited recently by dyspnea.  Reports some lightheadedness but denies any syncope, attributes to vertigo.  Reports some lower extremity edema.  Reports rare palpitations.  No smoking history.  No history of heart disease in her immediate family.  LDL 68 on 01/16/2022.  Echocardiogram 07/03/2022 showed EF 55 to 60%, normal RV function, mild to moderate mitral regurgitation, mild aortic regurgitation, mild dilatation of ascending aorta measuring 39 mm.  Coronary CTA on 07/04/2022 showed nonobstructive CAD with mild stenosis in proximal LAD, calcium score 87 (81st percentile).  Since last clinic visit, she reports she is doing okay.  Denies any chest pain.  Does report that she has had some shortness of breath recently but improving.  Reports occasional lightheadedness but denies any syncope.  Has had some swelling in right lower extremity.  Reports occasional palpitations.  She was swimming this summer but not much recently.  She is going  to start going to the gym.   BP Readings from Last 3 Encounters:  05/18/23 (!) 150/96  03/07/23 (!) 144/90  01/09/23 136/78     Past Medical History:  Diagnosis Date   Arthritis    Dyspnea    with exertion   Family history of adverse reaction to anesthesia    mother- had problems with knee surgery but does not remember what   GERD (gastroesophageal reflux disease)    Heart disorder    Heart Muscle Spasms   Heart murmur    as a child   Hypertension    Hypothyroidism    Pancreatic insufficiency    PONV (postoperative nausea and vomiting)    Pre-diabetes    Sleep apnea    cpap    Past Surgical History:  Procedure Laterality Date   CESAREAN SECTION     ESOPHAGOGASTRODUODENOSCOPY (EGD) WITH PROPOFOL N/A 01/01/2013   Procedure: ESOPHAGOGASTRODUODENOSCOPY (EGD) WITH PROPOFOL;  Surgeon: Willis Modena, MD;  Location: [facility];  Service: Endoscopy;  Laterality: N/A;   EUS N/A 01/01/2013   Procedure: ESOPHAGEAL ENDOSCOPIC ULTRASOUND (EUS) RADIAL;  Surgeon: Willis Modena, MD;  Location: [facility];  Service: Endoscopy;  Laterality: N/A;   KNEE ARTHROPLASTY Right 12/20/2022   Procedure: COMPUTER ASSISTED TOTAL KNEE ARTHROPLASTY;  Surgeon: Samson Frederic, MD;  Location: [facility];  Service: Orthopedics;  Laterality: Right;  160   LEFT HEART CATH AND CORONARY ANGIOGRAPHY  2010   NASAL SINUS SURGERY     TUBAL LIGATION  2001    Current Medications: Current Meds  Medication Sig  albuterol (VENTOLIN HFA) 108 (90 Base) MCG/ACT inhaler Inhale 2 puffs into the lungs every 6 (six) hours as needed for wheezing or shortness of breath.   amLODipine (NORVASC) 10 MG tablet Take 1 tablet (10 mg total) by mouth daily.   Ascorbic Acid (VITAMIN C) 1000 MG tablet Take 1,000 mg by mouth daily.   atorvastatin (LIPITOR) 10 MG tablet Take 1 tablet (10 mg total) by mouth daily. (Patient taking differently: Take 10 mg by mouth every evening.)   Azelastine HCl 137 MCG/SPRAY SOLN Place 2 sprays into  both nostrils every evening.   Cholecalciferol (VITAMIN D3 MAXIMUM STRENGTH) 125 MCG (5000 UT) capsule Take 5,000 Units by mouth daily.   CREON 36000-114000 units CPEP capsule Take 72,000 Units by mouth 3 (three) times daily with meals.   famotidine (PEPCID) 10 MG tablet Take 10 mg by mouth daily as needed for heartburn.   fluticasone (FLONASE) 50 MCG/ACT nasal spray Place 2 sprays into both nostrils daily.   levothyroxine (SYNTHROID, LEVOTHROID) 50 MCG tablet Take 50 mcg by mouth daily before breakfast.   losartan (COZAAR) 100 MG tablet Take 100 mg by mouth daily before breakfast.    Multiple Vitamins-Minerals (MULTIVITAMIN WITH MINERALS) tablet Take 1 tablet by mouth daily.   ondansetron (ZOFRAN) 4 MG tablet Take 1 tablet (4 mg total) by mouth every 8 (eight) hours as needed for nausea or vomiting. (Patient taking differently: Take 4 mg by mouth every 8 (eight) hours as needed for nausea or vomiting. Prn)   traZODone (DESYREL) 50 MG tablet Take 1/2 to 1 whole tablet by mouth at bedtime as needed for sleep. Do not drive after taking   [DISCONTINUED] amLODipine (NORVASC) 5 MG tablet Take 5 mg by mouth daily before breakfast.      Allergies:   Tylenol [acetaminophen]   Social History   Socioeconomic History   Marital status: Married    Spouse name: Not on file   Number of children: Not on file   Years of education: Not on file   Highest education level: Not on file  Occupational History   Not on file  Tobacco Use   Smoking status: Never   Smokeless tobacco: Never  Vaping Use   Vaping status: Never Used  Substance and Sexual Activity   Alcohol use: Yes    Comment: occassionally   Drug use: No   Sexual activity: Not on file  Other Topics Concern   Not on file  Social History Narrative   Not on file   Social Determinants of Health   Financial Resource Strain: Not on file  Food Insecurity: No Food Insecurity (12/20/2022)   Hunger Vital Sign    Worried About Running Out of Food  in the Last Year: Never true    Ran Out of Food in the Last Year: Never true  Transportation Needs: No Transportation Needs (12/20/2022)   PRAPARE - Administrator, Civil Service (Medical): No    Lack of Transportation (Non-Medical): No  Physical Activity: Not on file  Stress: Not on file  Social Connections: Not on file     Family History: The patient's family history includes Diabetes in her father; Heart disease in her father; Hyperlipidemia in her father.  ROS:   Please see the history of present illness.     All other systems reviewed and are negative.  EKGs/Labs/Other Studies Reviewed:    The following studies were reviewed today:   EKG:   06/16/2022: Normal sinus rhythm, rate 71, Q  wave in aVL, poor R wave progression 01/09/2023: Sinus tachycardia, rate 101, nonspecific T wave flattening 05/18/2023: Normal sinus rhythm, rate 81, LVH  Recent Labs: 12/21/2022: BUN 18; Creatinine, Ser 0.79; Hemoglobin 10.1; Platelets 220; Potassium 4.3; Sodium 137  Recent Lipid Panel    Component Value Date/Time   CHOL 152 09/08/2017 0524   TRIG 37 09/08/2017 0524   HDL 57 09/08/2017 0524   CHOLHDL 2.7 09/08/2017 0524   VLDL 7 09/08/2017 0524   LDLCALC 88 09/08/2017 0524    Physical Exam:    VS:  BP (!) 150/96   Pulse 81   Ht 5\' 1"  (1.549 m)   Wt 219 lb (99.3 kg)   SpO2 96%   BMI 41.38 kg/m     Wt Readings from Last 3 Encounters:  05/18/23 219 lb (99.3 kg)  03/07/23 218 lb (98.9 kg)  01/09/23 222 lb 12.8 oz (101.1 kg)     GEN:  Well nourished, well developed in no acute distress HEENT: Normal NECK: No JVD; No carotid bruits LYMPHATICS: No lymphadenopathy CARDIAC: RRR, no murmurs, rubs, gallops RESPIRATORY:  Clear to auscultation without rales, wheezing or rhonchi  ABDOMEN: Soft, non-tender, non-distended MUSCULOSKELETAL:  No edema; No deformity  SKIN: Warm and dry NEUROLOGIC:  Alert and oriented x 3 PSYCHIATRIC:  Normal affect   ASSESSMENT:    1. CAD in  native artery   2. Essential hypertension   3. Lower leg edema   4. Hyperlipidemia, unspecified hyperlipidemia type   5. Morbid obesity (HCC)     PLAN:    CAD: Reported dyspnea on exertion.  Echocardiogram 07/03/2022 showed EF 55 to 60%, normal RV function, mild to moderate mitral regurgitation, mild aortic regurgitation, mild dilatation of ascending aorta measuring 39 mm.  Coronary CTA on 07/04/2022 showed nonobstructive CAD with mild stenosis in proximal LAD, calcium score 87 (81st percentile). -Prescribed atorvastatin but did not start, recommend starting statin  Right lower extremity edema: 1+ right lower extremity edema, following knee surgery.  Lower extremity duplex showed no DVT on 01/16/2023  Mild regurgitation: Mild to moderate on echocardiogram 06/2022.  Will monitor, plan repeat echocardiogram next year  Hypertension: On losartan 100 mg daily and amlodipine 5 mg daily.  BP elevated, recommend increasing amlodipine to 10 mg daily.  Asked to check BP daily for next 2 weeks and let us know results.  Prediabetes: A1c 6.3% on 08/22/2022  OSA: on CPAP, reports compliance  Morbid obesity: Body mass index is 41.38 kg/m.  Diet/exercise encouraged  Hyperlipidemia: LDL 99 on 08/22/2022.  Goal LDL less than 70 given CAD as above.  Had prescribed atorvastatin 10 mg daily but reports she did not start taking.  Recommend starting atorvastatin 10 mg daily and will check fasting lipid panel in 2 months  RTC in 6 months   Medication Adjustments/Labs and Tests Ordered: Current medicines are reviewed at length with the patient today.  Concerns regarding medicines are outlined above.  Orders Placed This Encounter  Procedures   Lipid panel   EKG 12-Lead   ECHOCARDIOGRAM COMPLETE   Meds ordered this encounter  Medications   amLODipine (NORVASC) 10 MG tablet    Sig: Take 1 tablet (10 mg total) by mouth daily.    Dispense:  90 tablet    Refill:  3    There are no Patient Instructions on  file for this visit.   Signed, Little Ishikawa, MD  05/18/2023 3:44 PM    [facility]

## 2023-06-24 IMAGING — CT CT ANGIO NECK
1 of 7 series · 7 of 33 positions shown · non-contrast
Comparison: None.

CLINICAL DATA: Neck trauma, arterial injury suspected right



[Series 8: ax thin · axial · 0.39mm/px · z∈[-266,-96]mm · 7 of 236 slices shown]
[im 30/236  soft-tissue]
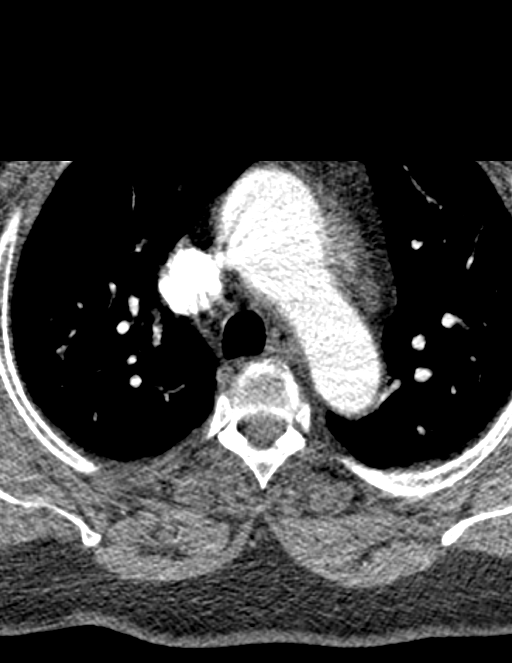
[im 59/236  bone]
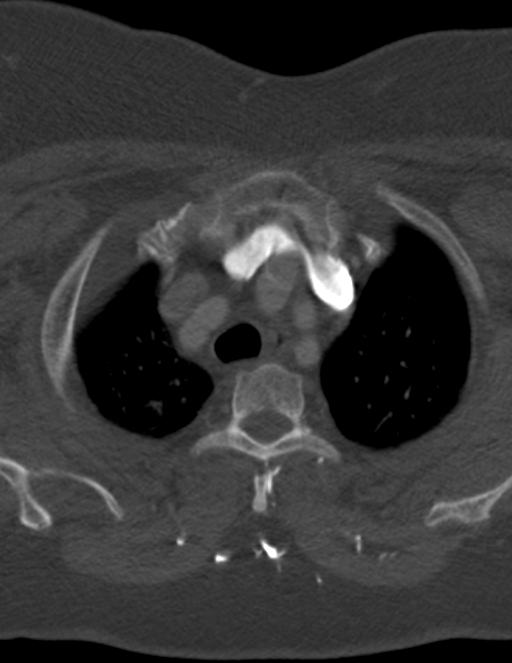
[im 89/236  soft-tissue]
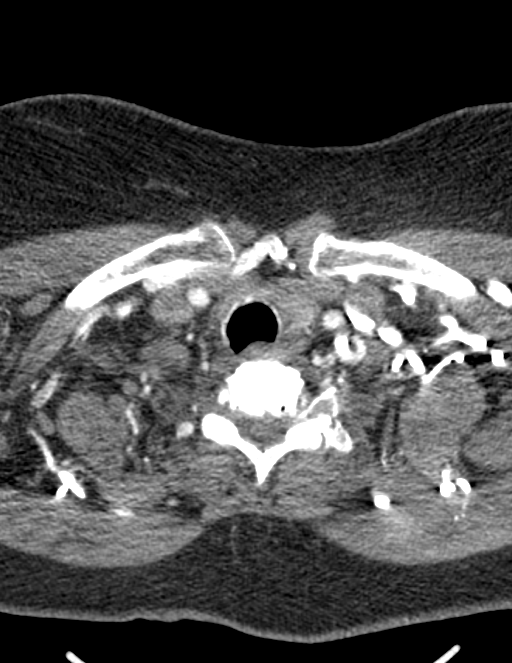
[im 118/236  bone]
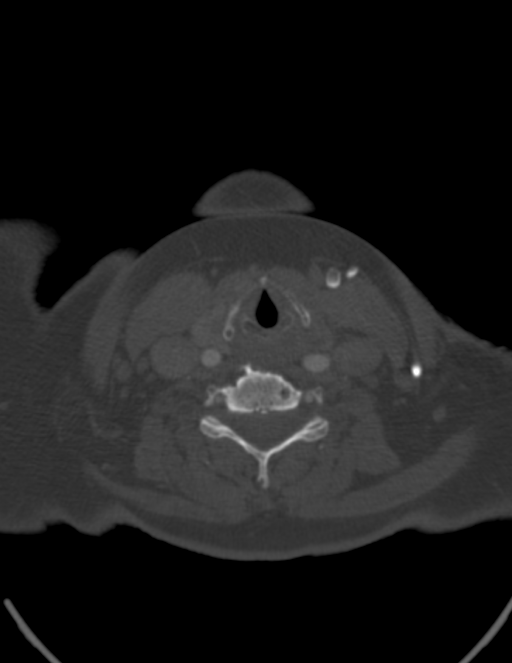
[im 147/236  soft-tissue]
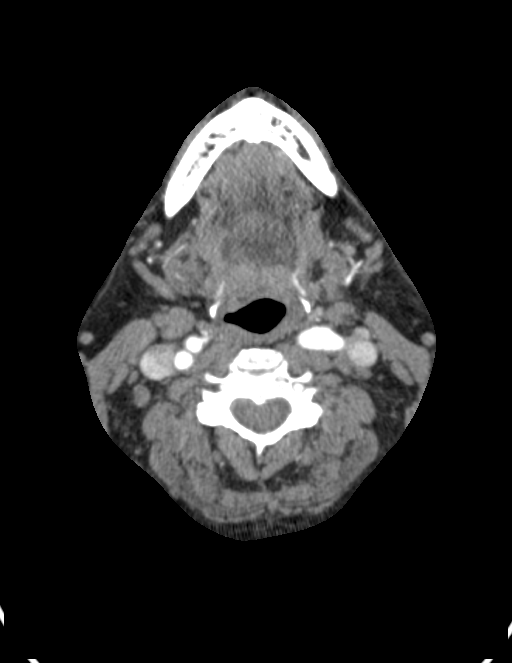
[im 177/236  bone]
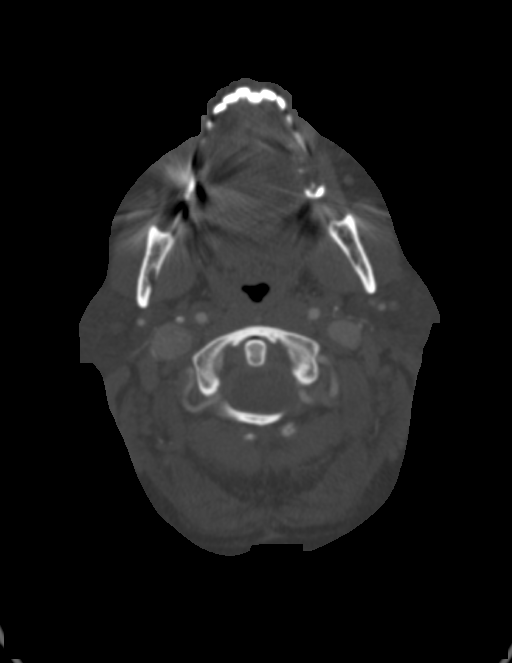
[im 206/236  soft-tissue]
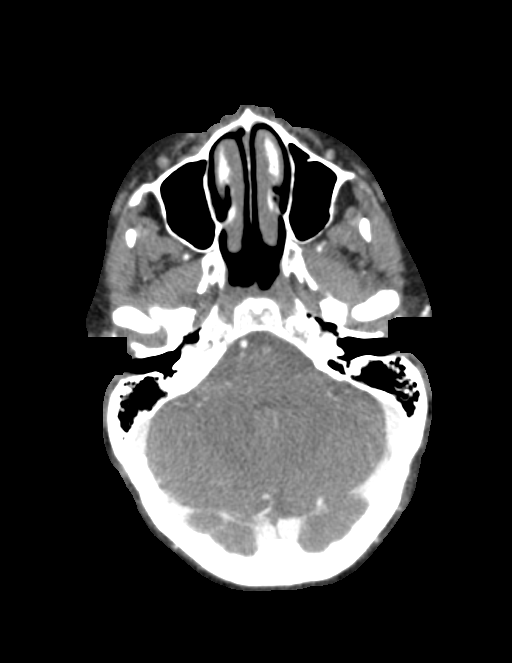

[7 of 33 positions shown; findings below may reference images not displayed]

RADIATION DOSE REDUCTION: This exam was performed according to the
departmental dose-optimization program which includes automated
exposure control, adjustment of the mA and/or kV according to
patient size and/or use of iterative reconstruction technique.

CONTRAST:  75mL OMNIPAQUE IOHEXOL 350 MG/ML SOLN
FINDINGS: Aortic arch: Great vessel origins are patent.

Right carotid system: Patent. Trace calcified plaque at the
bifurcation. No stenosis or evidence of dissection.

Left carotid system: Patent.  No stenosis or evidence of dissection.

Vertebral arteries: Patent. Left vertebral is mildly dominant. No
stenosis or evidence of dissection.

Skeleton: Advanced degenerative changes of the cervical spine.
Temporomandibular joints are unremarkable.

Other neck: Unremarkable.

Upper chest: Included upper lungs are clear.
IMPRESSION: No acute or significant vascular abnormality.

## 2023-06-24 IMAGING — CR DG CHEST 2V
2 series · 2 of 2 positions shown · non-contrast
Comparison: 05/08/2020

CLINICAL DATA: Shortness of breath.

EXAM:
CHEST - 2 VIEW

[w chest pa]
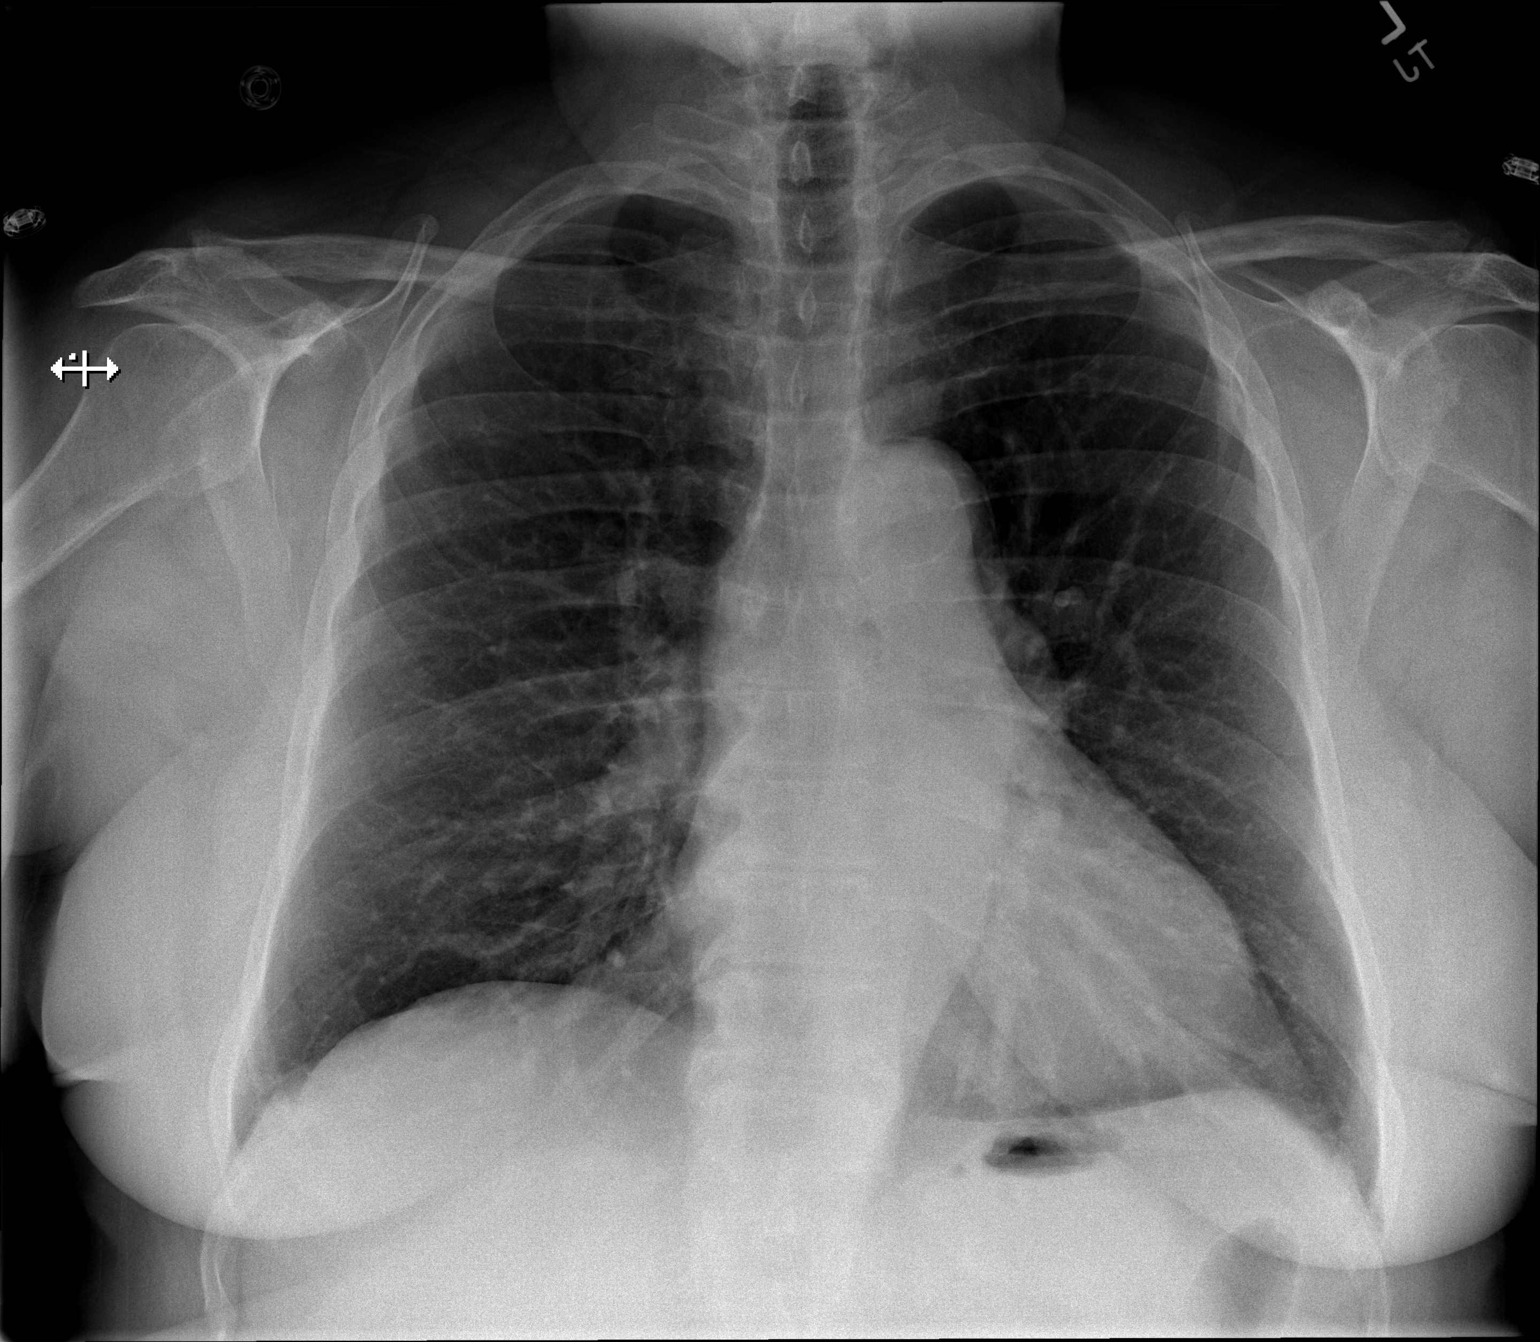

[w chest lat]
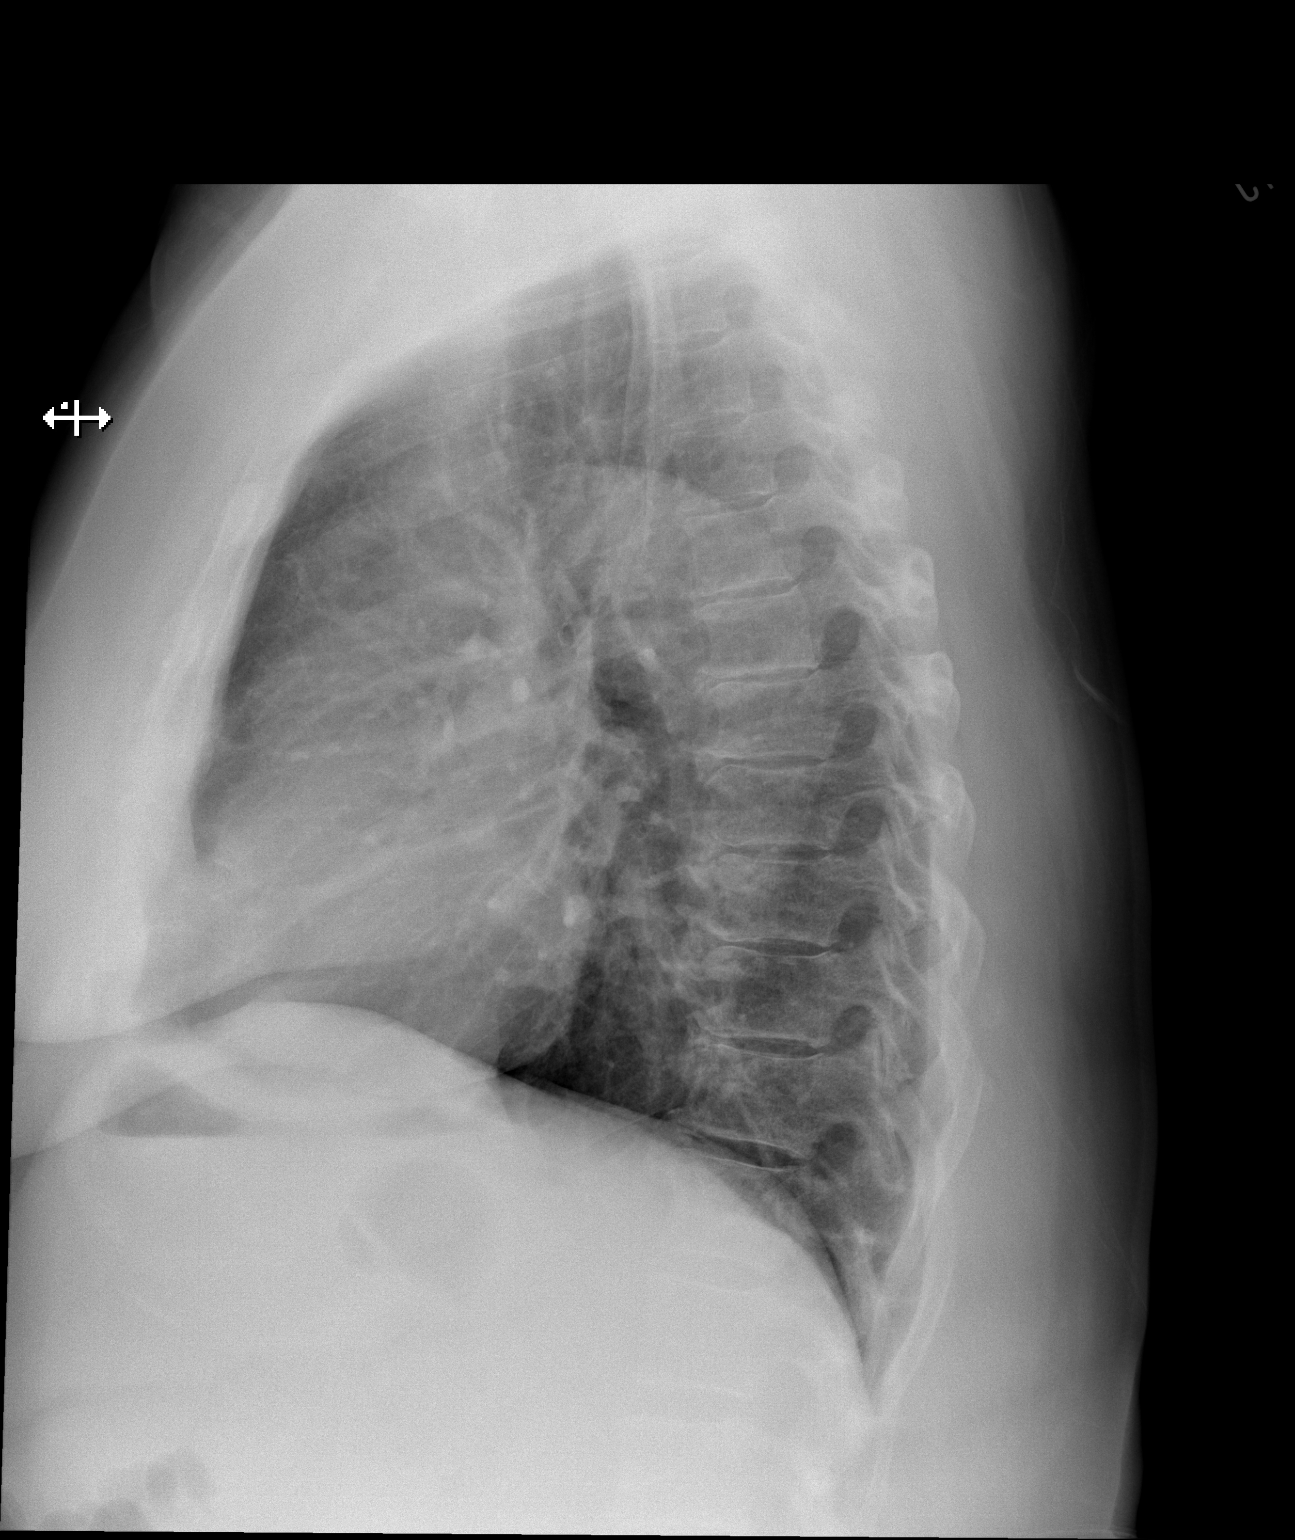

[2 of 2 positions shown; findings below may reference images not displayed]

FINDINGS: The cardiomediastinal contours are normal. Atherosclerosis of the
aortic arch. The lungs are clear. Pulmonary vasculature is normal.
No consolidation, pleural effusion, or pneumothorax. No acute
osseous abnormalities are seen.
IMPRESSION: No acute chest findings or explanation for shortness of breath.

## 2023-09-07 ENCOUNTER — Encounter: Payer: Self-pay | Admitting: Nurse Practitioner

## 2023-09-07 ENCOUNTER — Ambulatory Visit (INDEPENDENT_AMBULATORY_CARE_PROVIDER_SITE_OTHER): Payer: Medicare Other | Admitting: Nurse Practitioner

## 2023-09-07 VITALS — BP 112/70 | HR 84 | Temp 97.9°F | Ht 61.0 in | Wt 226.0 lb

## 2023-09-07 DIAGNOSIS — F419 Anxiety disorder, unspecified: Secondary | ICD-10-CM

## 2023-09-07 DIAGNOSIS — F5102 Adjustment insomnia: Secondary | ICD-10-CM

## 2023-09-07 DIAGNOSIS — G4733 Obstructive sleep apnea (adult) (pediatric): Secondary | ICD-10-CM

## 2023-09-07 DIAGNOSIS — F5101 Primary insomnia: Secondary | ICD-10-CM

## 2023-09-07 MED ORDER — TRAZODONE HCL 50 MG PO TABS: ORAL_TABLET | ORAL | 3 refills | Status: DC

## 2023-09-07 NOTE — Assessment & Plan Note (Signed)
Severe OSA on CPAP. Excellent compliance and control. Receives benefit from use. Aware of proper care/use. Understands risks of untreated OSA. Healthy weight loss encouraged. Safe driving practices reviewed.  Patient Instructions  Continue CPAP auto 5-20 cmH2O every night, minimum of 4-6 hours a night.  Be aware of reduced alertness and do not drive or operate heavy machinery if experiencing this or drowsiness.    Continue Trazodone 25-50 mg (1/2 tab to 1 tab) At bedtime as needed for sleep. Take 30 minutes prior to bedtime. Put your CPAP on within 15 minutes after taking to ensure you don't fall asleep without it on. Do not drive after taking.   Continue Albuterol inhaler 2 puffs every 6 hours as needed for shortness of breath or wheezing. Notify if symptoms persist despite rescue inhaler/neb use.    Exercise 150 min/week   Follow up in 1 year with Dr. Wynona Neat or Rhunette Croft NP. If symptoms worsen, please contact office for sooner follow up or seek emergency care.

## 2023-09-07 NOTE — Progress Notes (Signed)
@Patient  ID: [name], female    DOB: 1956-05-01, 68 y.o.   MRN: [id number]  Chief Complaint  Patient presents with   Follow-up    Referring provider: Deatra James, MD  HPI: 68 year old female, never smoker followed for OSA. She is a patient of Dr. Trena Platt and last seen in office on 03/10/2023 by [facility] NP. Past medical history significant for HTN, GERD, allergic rhinitis, pancreatic insufficiency.   TEST/EVENTS:  10/12/2019 CTA chest: no PE. No LAD. Lungs are clear.  02/15/2022 HST: AHI 45.9, SpO2 low 80% Severe OSA 06/01/2022 PFT: FVC 78, FEV1 83, ratio 85, TLC 94, DLCO 92.  No BD  10/24/2021: OV with Dr. Wynona Neat. Seen 2 years ago but unable to complete sleep study at the time. Has issues with sleep and snoring. Sleep nonrestorative. Weight stable. Concerned for OSA - HST ordered.   03/08/2022: OV with Hamdan Toscano NP to discuss sleep study results which showed severe obstructive sleep apnea. She continues to have daytime fatigue symptoms and feels like her sleep is not restful. Wakes in the morning feeling groggy and with a dry mouth. She denies any sleep parasomnias, morning headaches, or drowsy driving. She is hoping to get started on CPAP therapy - order sent to DME for new start auto CPAP 5-20 cmH2O.   03/29/2022: OV with Emilliano Dilworth NP to discuss issues with her CPAP/sleep. She received her mask last week. She is having some trouble adjusting to wearing her CPAP. Feels like she gets distracted by the mask and her mind is all over the place as she's trying to fall asleep. She has started turning on the TV for a few minutes after she puts her mask on, which has helped some. She has only worn it three nights, one of which was for a little over 4 hours. She did notice a difference in how well she slept and how she felt more rested after that night versus others. She also is concerned that her mask may be too tight. She denies any morning headaches, sleep parasomnias, or drowsy driving. She  doesn't feel like she's having any significant mask leaks.   05/31/2022: OV with Bill Mcvey NP for follow up. Since I saw her last, she has been doing significantly better with her CPAP. She is wearing it nightly. Feels like she sleeps much better now. She occasionally uses the trazodone to help her fall asleep. She's not having any issues with her machine; occasionally notices a little leak. Wakes feeling better rested. Denies morning headaches or drowsy driving.  Excellent compliance.  Residual AHI 0.7 She has been struggling with some shortness of breath recently. She says that it's been going on for months to years now. She associated it more so with her weight but now is starting to get concerned. Usually only occurs with exertion but she will occasionally feel like she can't get a big deep breath in at rest. Rarely has some wheezing. She denies any cough or chest congestion. She does occasionally have associated dizziness and palpitations. She denies orthopnea, PND or chest pain. She has seen a cardiologist years ago; would like to go back. She has not pulmonary history; denies any childhood asthma. No autoimmune diseases. No significant environmental or occupational exposures. Never tried inhalers.  Referred to cardiology.  PFTs ordered for further evaluation.  Provided with trial of albuterol as needed.  06/08/2022: OV with Taiz Bickle NP for follow-up to discuss pulmonary function testing.  PFTs were overall unremarkable.  She did have a  very mild restriction with FVC 78%; however, TLC remain normal.  She tells me today that she has been feeling pretty well since I saw her last.  She does feel like her shortness of breath is somewhat better and still only occurs with exertion.  She has used her albuterol a few times, feels like it provides some relief. She's only used it a handful of times. No significant cough, chest congestion, or wheezing. Denies orthopnea, PND, syncope. She still has some occasional  palpitations, usually with exertion. Going to see cardiology next week. She does want to work on weight loss measures; thinks this is the cause of her getting winded.  Wearing CPAP nightly. Feels much better using it. Fatigue has significantly improved. Denies morning headaches or drowsy driving.   09/01/1476: OV with Deanna Boehlke NP for follow up. She had knee surgery about 2 months ago. Recovering well but having some tightness that has been slow to resolve. She is seeing her surgeon Tuesday. From a breathing standpoint, she has been doing well. Hasn't required her rescue inhaler.  She is sleeping with her CPAP nightly. She feels like this has been life changing for her. She's able to sleep through the night, most nights, and wakes feeling rested. Energy levels are better. She will occasionally take 1/2-1 tab of trazodone, which helps her shut off her brain and fall asleep. Doesn't require this every night. No mood changes or side effects with this. No residual AM grogginess. She denies any sleep parasomnias/paralysis or drowsy driving. Still having some leaks but they don't tend to bother her.  02/05/2023-03/06/2023: CPAP 5-20 cmH2O 30/30 days; 100% > 4 hr; average use 9 hr 26 min Pressure 95th 13.3 Leaks 95th 35.9 AHI 1.3  09/07/2023: Today - follow up Patient presents today for follow up. She's been doing well with her CPAP. Wears it nightly. Sleeps well with it. Feels much better rested with her CPAP. Energy levels are good during the day for the most part. The winter has made her not want to go out to the gym or exercise as much with it being so cold. She's trying to motivate herself more. She denies any issues with drowsy driving or morning headaches. No issues with mask fit or leaks. She takes trazodone at night for sleep, which works well for her. Usually only takes 1/2 tablet but sometimes takes 1. It also helps calm her mind. She denies any mood changes.  She feels like breathing has been doing well  for the most part. Rarely uses the albuterol. No cough or wheezing.  08/08/2023-09/06/2023: CPAP 5-20 cmH2O 30/30 days; 100% >4 hr; average use 10 hr  Pressure 95th 13.7 Leaks 95th 31.7 AHI 1.4  Allergies  Allergen Reactions   Tylenol [Acetaminophen] Other (See Comments)    Makes her feel jumpy.    Immunization History  Administered Date(s) Administered   PFIZER(Purple Top)SARS-COV-2 Vaccination 11/25/2019, 05/05/2020   Tdap 06/28/2016, 08/14/2016   Zoster, Live 06/22/2011    Past Medical History:  Diagnosis Date   Arthritis    Dyspnea    with exertion   Family history of adverse reaction to anesthesia    mother- had problems with knee surgery but does not remember what   GERD (gastroesophageal reflux disease)    Heart disorder    Heart Muscle Spasms   Heart murmur    as a child   Hypertension    Hypothyroidism    Pancreatic insufficiency    PONV (postoperative nausea and vomiting)  Pre-diabetes    Sleep apnea    cpap    Tobacco History: Social History   Tobacco Use  Smoking Status Never  Smokeless Tobacco Never   Counseling given: Not Answered   Outpatient Medications Prior to Visit  Medication Sig Dispense Refill   albuterol (VENTOLIN HFA) 108 (90 Base) MCG/ACT inhaler Inhale 2 puffs into the lungs every 6 (six) hours as needed for wheezing or shortness of breath. 8 g 6   Ascorbic Acid (VITAMIN C) 1000 MG tablet Take 1,000 mg by mouth daily.     atorvastatin (LIPITOR) 10 MG tablet Take 1 tablet (10 mg total) by mouth daily. (Patient taking differently: Take 10 mg by mouth every evening.) 90 tablet 3   Azelastine HCl 137 MCG/SPRAY SOLN Place 2 sprays into both nostrils every evening.     CREON 36000-114000 units CPEP capsule Take 72,000 Units by mouth 3 (three) times daily with meals.     levothyroxine (SYNTHROID, LEVOTHROID) 50 MCG tablet Take 50 mcg by mouth daily before breakfast.     losartan (COZAAR) 100 MG tablet Take 100 mg by mouth daily before  breakfast.      methocarbamol (ROBAXIN) 500 MG tablet Take 1 tablet (500 mg total) by mouth every 6 (six) hours as needed for muscle spasms. 20 tablet 0   Multiple Vitamins-Minerals (MULTIVITAMIN WITH MINERALS) tablet Take 1 tablet by mouth daily.     traZODone (DESYREL) 50 MG tablet Take 1/2 to 1 whole tablet by mouth at bedtime as needed for sleep. Do not drive after taking 90 tablet 2   amLODipine (NORVASC) 10 MG tablet Take 1 tablet (10 mg total) by mouth daily. 90 tablet 3   Cholecalciferol (VITAMIN D3 MAXIMUM STRENGTH) 125 MCG (5000 UT) capsule Take 5,000 Units by mouth daily. (Patient not taking: Reported on 09/07/2023)     famotidine (PEPCID) 10 MG tablet Take 10 mg by mouth daily as needed for heartburn. (Patient not taking: Reported on 09/07/2023)     fluticasone (FLONASE) 50 MCG/ACT nasal spray Place 2 sprays into both nostrils daily. (Patient not taking: Reported on 09/07/2023)     meloxicam (MOBIC) 15 MG tablet Take 1 tablet (15 mg total) by mouth daily. (Patient not taking: Reported on 09/07/2023) 30 tablet 2   ondansetron (ZOFRAN) 4 MG tablet Take 1 tablet (4 mg total) by mouth every 8 (eight) hours as needed for nausea or vomiting. (Patient not taking: Reported on 09/07/2023) 30 tablet 0   No facility-administered medications prior to visit.     Review of Systems:   Constitutional: No weight loss or gain, night sweats, fevers, chills, lassitude, fatigue  HEENT: No headaches, difficulty swallowing, tooth/dental problems, or sore throat. No sneezing, itching, ear ache, nasal congestion, or post nasal drip.  CV:  +mild BLE swelling (baseline). No chest pain, orthopnea, PND, anasarca, syncope, palpitations, dizziness  Resp: +shortness of breath with exertion (minimal; improved). No wheezing. No excess mucus or change in color of mucus. No productive or non-productive. No hemoptysis. No chest wall deformity Skin: No rash, lesions, ulcerations MSK:  No joint pain/swelling. No decreased  range of motion.  No back pain. Neuro: No gait abnormalities, weakness, numbness/tingling  Psych: No depression or anxiety. Mood stable.     Physical Exam:  BP 112/70 (BP Location: Right Arm, Patient Position: Sitting, Cuff Size: Normal)   Pulse 84   Temp 97.9 F (36.6 C) (Oral)   Ht 5\' 1"  (1.549 m)   Wt 226 lb (102.5 kg)  SpO2 98%   BMI 42.70 kg/m   GEN: Pleasant, interactive, well-appearing; obese; in no acute distress. HEENT:  Normocephalic and atraumatic. PERRLA. Sclera white. Nasal turbinates pink, moist and patent bilaterally. No rhinorrhea present. Oropharynx pink and moist, without exudate or edema. No lesions, ulcerations, or postnasal drip.  NECK:  Supple w/ fair ROM. No JVD present. Normal carotid impulses w/o bruits. Thyroid symmetrical with no goiter or nodules palpated. No lymphadenopathy.   CV: RRR, no m/r/g, no peripheral edema. Pulses intact, +2 bilaterally. No cyanosis, pallor or clubbing. PULMONARY:  Unlabored, regular breathing. Clear bilaterally A&P w/o wheezes/rales/rhonchi. No accessory muscle use. No dullness to percussion. GI: BS present and normoactive. Soft, non-tender to palpation. No organomegaly or masses detected.  MSK: No erythema, warmth or tenderness. Well healed right knee incision Neuro: A/Ox3. No focal deficits noted.   Skin: Warm, no lesions or rashe Psych: Normal affect and behavior. Judgement and thought content appropriate.     Lab Results:  CBC    Component Value Date/Time   WBC 14.1 (H) 12/21/2022 0345   RBC 3.93 12/21/2022 0345   HGB 10.1 (L) 12/21/2022 0345   HCT 31.4 (L) 12/21/2022 0345   PLT 220 12/21/2022 0345   MCV 79.9 (L) 12/21/2022 0345   MCH 25.7 (L) 12/21/2022 0345   MCHC 32.2 12/21/2022 0345   RDW 14.9 12/21/2022 0345   LYMPHSABS 1.8 10/04/2021 1636   MONOABS 0.5 10/04/2021 1636   EOSABS 0.1 10/04/2021 1636   BASOSABS 0.1 10/04/2021 1636    BMET    Component Value Date/Time   NA 137 12/21/2022 0345   NA 140  06/16/2022 0948   K 4.3 12/21/2022 0345   CL 106 12/21/2022 0345   CO2 24 12/21/2022 0345   GLUCOSE 141 (H) 12/21/2022 0345   BUN 18 12/21/2022 0345   BUN 20 06/16/2022 0948   CREATININE 0.79 12/21/2022 0345   CREATININE 0.73 10/21/2011 1238   CALCIUM 8.4 (L) 12/21/2022 0345   GFRNONAA >60 12/21/2022 0345   GFRAA >60 05/08/2020 0625    BNP    Component Value Date/Time   BNP 26.0 12/09/2019 1322     Imaging:  No results found.  Administration History     None          Latest Ref Rng & Units 06/01/2022    3:49 PM  PFT Results  FVC-Pre L 2.22   FVC-Predicted Pre % 78   FVC-Post L 2.09   FVC-Predicted Post % 73   Pre FEV1/FVC % % 81   Post FEV1/FCV % % 85   FEV1-Pre L 1.81   FEV1-Predicted Pre % 83   FEV1-Post L 1.79   DLCO uncorrected ml/min/mmHg 16.56   DLCO UNC% % 92   DLCO corrected ml/min/mmHg 16.56   DLCO COR %Predicted % 92   DLVA Predicted % 103   TLC L 4.37   TLC % Predicted % 94   RV % Predicted % 108     No results found for: "NITRICOXIDE"      Assessment & Plan:   Severe obstructive sleep apnea Severe OSA on CPAP. Excellent compliance and control. Receives benefit from use. Aware of proper care/use. Understands risks of untreated OSA. Healthy weight loss encouraged. Safe driving practices reviewed.  Patient Instructions  Continue CPAP auto 5-20 cmH2O every night, minimum of 4-6 hours a night.  Be aware of reduced alertness and do not drive or operate heavy machinery if experiencing this or drowsiness.    Continue Trazodone 25-50  mg (1/2 tab to 1 tab) At bedtime as needed for sleep. Take 30 minutes prior to bedtime. Put your CPAP on within 15 minutes after taking to ensure you don't fall asleep without it on. Do not drive after taking.   Continue Albuterol inhaler 2 puffs every 6 hours as needed for shortness of breath or wheezing. Notify if symptoms persist despite rescue inhaler/neb use.    Exercise 150 min/week   Follow up in 1  year with Dr. Wynona Neat or Rhunette Croft NP. If symptoms worsen, please contact office for sooner follow up or seek emergency care.    Morbid obesity with BMI of 40.0-44.9, adult (HCC) BMI 42. Healthy weight loss encouraged.      I spent 28 minutes of dedicated to the care of this patient on the date of this encounter to include pre-visit review of records, face-to-face time with the patient discussing conditions above, post visit ordering of testing, clinical documentation with the electronic health record, making appropriate referrals as documented, and communicating necessary findings to members of the patients care team.  Noemi Chapel, NP 09/07/2023  Pt aware and understands NP's role.

## 2023-09-07 NOTE — Patient Instructions (Addendum)
Continue CPAP auto 5-20 cmH2O every night, minimum of 4-6 hours a night.  Be aware of reduced alertness and do not drive or operate heavy machinery if experiencing this or drowsiness.    Continue Trazodone 25-50 mg (1/2 tab to 1 tab) At bedtime as needed for sleep. Take 30 minutes prior to bedtime. Put your CPAP on within 15 minutes after taking to ensure you don't fall asleep without it on. Do not drive after taking.   Continue Albuterol inhaler 2 puffs every 6 hours as needed for shortness of breath or wheezing. Notify if symptoms persist despite rescue inhaler/neb use.    Exercise 150 min/week   Follow up in 1 year with Dr. Wynona Neat or Rhunette Croft NP. If symptoms worsen, please contact office for sooner follow up or seek emergency care.

## 2023-09-07 NOTE — Assessment & Plan Note (Signed)
BMI 42. Healthy weight loss encouraged.

## 2023-11-16 ENCOUNTER — Ambulatory Visit (HOSPITAL_COMMUNITY): Payer: Medicare Other

## 2023-12-17 ENCOUNTER — Ambulatory Visit (HOSPITAL_COMMUNITY)

## 2024-01-18 ENCOUNTER — Ambulatory Visit (HOSPITAL_COMMUNITY)
Admission: RE | Admit: 2024-01-18 | Discharge: 2024-01-18 | Disposition: A | Discharge status: Home / Self Care | Source: Ambulatory Visit | Attending: Internal Medicine | Admitting: Internal Medicine

## 2024-01-18 DIAGNOSIS — E785 Hyperlipidemia, unspecified: Secondary | ICD-10-CM | POA: Insufficient documentation

## 2024-01-18 DIAGNOSIS — I10 Essential (primary) hypertension: Secondary | ICD-10-CM | POA: Insufficient documentation

## 2024-01-18 DIAGNOSIS — I351 Nonrheumatic aortic (valve) insufficiency: Secondary | ICD-10-CM

## 2024-01-18 DIAGNOSIS — I2510 Atherosclerotic heart disease of native coronary artery without angina pectoris: Secondary | ICD-10-CM | POA: Diagnosis present

## 2024-01-18 DIAGNOSIS — R600 Localized edema: Secondary | ICD-10-CM | POA: Diagnosis present

## 2024-01-18 LAB — ECHOCARDIOGRAM COMPLETE
Area-P 1/2: 3.6 cm2
S' Lateral: 3.2 cm

## 2024-01-20 ENCOUNTER — Ambulatory Visit: Payer: Self-pay | Admitting: Cardiology

## 2024-01-23 NOTE — Telephone Encounter (Signed)
 Called and left message with patient results. Per Dr. Alda Amas, Mild dilatation of ascending aorta otherwise no significant abnormalities. Left message for patient to call office for any questions

## 2024-02-21 ENCOUNTER — Other Ambulatory Visit: Payer: Self-pay | Admitting: General Surgery

## 2024-02-21 DIAGNOSIS — M7989 Other specified soft tissue disorders: Secondary | ICD-10-CM

## 2024-02-26 ENCOUNTER — Ambulatory Visit
Admission: RE | Admit: 2024-02-26 | Discharge: 2024-02-26 | Disposition: A | Discharge status: Home / Self Care | Source: Ambulatory Visit | Attending: General Surgery | Admitting: General Surgery

## 2024-02-26 DIAGNOSIS — M7989 Other specified soft tissue disorders: Secondary | ICD-10-CM

## 2024-03-05 ENCOUNTER — Telehealth: Payer: Self-pay

## 2024-03-05 NOTE — Telephone Encounter (Addendum)
 Received CMN, signed and faxed. Received fax confirmation.

## 2024-04-15 ENCOUNTER — Ambulatory Visit (INDEPENDENT_AMBULATORY_CARE_PROVIDER_SITE_OTHER)

## 2024-04-15 VITALS — BP 131/78 | HR 78 | Ht 61.0 in | Wt 223.0 lb

## 2024-04-15 DIAGNOSIS — R1909 Other intra-abdominal and pelvic swelling, mass and lump: Secondary | ICD-10-CM

## 2024-04-15 DIAGNOSIS — R238 Other skin changes: Secondary | ICD-10-CM

## 2024-04-15 NOTE — Progress Notes (Signed)
 NAME: [name]  KIASIA CHOU  MRN: [id number]  DOB: 11/13/1955    Referring physician:??Delayne Artist PARAS, MD  PCP:?Madden, Artist PARAS, MD    CHIEF COMPLAINT:?Right groin mass   HPI:?  This is a 68 y.o. year old female with PMH and PSH as below who presents in consultation for mass on the right groin.   It has been present for more than a year. No history of trauma/surgery in the area.  Is the mass  painful, itchy, or tender? Tender  Has the size changed over time? Has been growing   Previous treatments? (surgery)? None  Do any family members have a history of masses? Denies  Patient denies h/o smoking, HTN, coagulopathies. Not on any anticoagulation.     Family History:   Family history is negative for bleeding/clotting disorders, problems with anesthesia, connective tissue disorders.     Social History:   Social History   Socioeconomic History   Marital status: Married    Spouse name: Not on file   Number of children: Not on file   Years of education: Not on file   Highest education level: Not on file  Occupational History   Not on file  Tobacco Use   Smoking status: Never   Smokeless tobacco: Never  Vaping Use   Vaping status: Never Used  Substance and Sexual Activity   Alcohol  use: Yes    Comment: occassionally   Drug use: No   Sexual activity: Not on file  Other Topics Concern   Not on file  Social History Narrative   Not on file   Social Drivers of Health   Financial Resource Strain: Not on file  Food Insecurity: No Food Insecurity (12/20/2022)   Hunger Vital Sign    Worried About Running Out of Food in the Last Year: Never true    Ran Out of Food in the Last Year: Never true  Transportation Needs: No Transportation Needs (12/20/2022)   PRAPARE - Administrator, Civil Service (Medical): No    Lack of Transportation (Non-Medical): No  Physical Activity: Not on file  Stress: Not on file  Social Connections: Not on file    Smoker/Vape Denies   Recreational Drug use: Denies   PMH:  Past Medical History:  Diagnosis Date   Arthritis    Dyspnea    with exertion   Family history of adverse reaction to anesthesia    mother- had problems with knee surgery but does not remember what   GERD (gastroesophageal reflux disease)    Heart disorder    Heart Muscle Spasms   Heart murmur    as a child   Hypertension    Hypothyroidism    Pancreatic insufficiency    PONV (postoperative nausea and vomiting)    Pre-diabetes    Sleep apnea    cpap      PSH:  Past Surgical History:  Procedure Laterality Date   CESAREAN SECTION     ESOPHAGOGASTRODUODENOSCOPY (EGD) WITH PROPOFOL  N/A 01/01/2013   Procedure: ESOPHAGOGASTRODUODENOSCOPY (EGD) WITH PROPOFOL ;  Surgeon: Elsie Cree, MD;  Location: [facility];  Service: Endoscopy;  Laterality: N/A;   EUS N/A 01/01/2013   Procedure: ESOPHAGEAL ENDOSCOPIC ULTRASOUND (EUS) RADIAL;  Surgeon: Elsie Cree, MD;  Location: [facility];  Service: Endoscopy;  Laterality: N/A;   KNEE ARTHROPLASTY Right 12/20/2022   Procedure: COMPUTER ASSISTED TOTAL KNEE ARTHROPLASTY;  Surgeon: Fidel Rogue, MD;  Location: [facility];  Service: Orthopedics;  Laterality: Right;  160   LEFT HEART CATH  AND CORONARY ANGIOGRAPHY  2010   NASAL SINUS SURGERY     TUBAL LIGATION  2001      MEDICATIONS:?   Current Outpatient Medications:    albuterol  (VENTOLIN  HFA) 108 (90 Base) MCG/ACT inhaler, Inhale 2 puffs into the lungs every 6 (six) hours as needed for wheezing or shortness of breath., Disp: 8 g, Rfl: 6   Ascorbic Acid (VITAMIN C) 1000 MG tablet, Take 1,000 mg by mouth daily., Disp: , Rfl:    Azelastine HCl 137 MCG/SPRAY SOLN, Place 2 sprays into both nostrils every evening., Disp: , Rfl:    CREON  36000-114000 units CPEP capsule, Take 72,000 Units by mouth 3 (three) times daily with meals., Disp: , Rfl:    hydrochlorothiazide (HYDRODIURIL) 25 MG tablet, Take 25 mg by mouth every morning., Disp: , Rfl:    levothyroxine   (SYNTHROID , LEVOTHROID) 50 MCG tablet, Take 50 mcg by mouth daily before breakfast., Disp: , Rfl:    losartan  (COZAAR ) 100 MG tablet, Take 100 mg by mouth daily before breakfast. , Disp: , Rfl:    methocarbamol  (ROBAXIN ) 500 MG tablet, Take 1 tablet (500 mg total) by mouth every 6 (six) hours as needed for muscle spasms., Disp: 20 tablet, Rfl: 0   Multiple Vitamins-Minerals (MULTIVITAMIN WITH MINERALS) tablet, Take 1 tablet by mouth daily., Disp: , Rfl:    traZODone  (DESYREL ) 50 MG tablet, Take 1/2 to 1 whole tablet by mouth at bedtime as needed for sleep. Do not drive after taking, Disp: 90 tablet, Rfl: 3   amLODipine  (NORVASC ) 10 MG tablet, Take 1 tablet (10 mg total) by mouth daily., Disp: 90 tablet, Rfl: 3   atorvastatin  (LIPITOR) 10 MG tablet, Take 1 tablet (10 mg total) by mouth daily. (Patient taking differently: Take 10 mg by mouth every evening.), Disp: 90 tablet, Rfl: 3    ALLERGIES:?  is allergic to tylenol  [acetaminophen ].    REVIEW OF SYSTEMS:  Review of Systems  ROS negative except as noted in HPI  VITALS:  Blood pressure 131/78, pulse 78, height 5' 1 (1.549 m), weight 223 lb (101.2 kg), SpO2 94%.   BP 131/78 (BP Location: Left Arm, Patient Position: Sitting, Cuff Size: Large)   Pulse 78   Ht 5' 1 (1.549 m)   Wt 223 lb (101.2 kg)   SpO2 94%   BMI 42.14 kg/m   Body mass index is 42.14 kg/m.  General: Well appearing, no apparent distress.  Neuro: A&Ox3 HEENT: Normocephalic, atraumatic, PERRL.  Mass/s location & Size: Right groin, 4 cm x 3 cm firm, edges well-defined not spreading beyond the original wound, no ulceration, infection, or secondary changes. Not fixed to underlying structures, mobile.    ASSESSMENT/PLAN  Assessment & Plan    Today we discussed the risks, benefits and alternatives to right groin mass excisio. We discussed the alternatives which include continued observation; however, I told the patient that I do not believe this mass will resolve on its own.  We then discussed the benefits of surgical excision which include complete removal of the lesion. We discussed the risks of excision which include seroma, hematoma, infection, bleeding, damage to surrounding healthy tissue and need for further surgery. We also discussed the risks wound separation and recurrence of the mass. We discussed scar patterns of tissue rearrangement, if needed.  I explained that the mass will be sent to pathology and if it were to be malignant further surgery may be needed. We discussed the risks of anesthesia which will be further elaborated on by  our anesthesia colleagues and the risks of radiation which will be further elaborated by our radiation oncology colleagues. The patient have a good understanding of all the risks and benefits, postoperative course and care. We obtained pictures. All questions were answered.    The plan agreed upon includes: Ultrasound of the right groin, and mass excision afterwards. Patient is also interested in HALO laser for fine wrinkles of the face. We will provide her with a quote.   I spent 30 minutes counseling the patient and discussing plan of care.  Vandy Fong M Shatika Grinnell [facility]

## 2024-05-01 ENCOUNTER — Other Ambulatory Visit: Payer: Self-pay | Admitting: Gastroenterology

## 2024-05-01 DIAGNOSIS — R1011 Right upper quadrant pain: Secondary | ICD-10-CM

## 2024-05-05 ENCOUNTER — Ambulatory Visit: Payer: Self-pay

## 2024-05-06 ENCOUNTER — Ambulatory Visit
Admission: RE | Admit: 2024-05-06 | Discharge: 2024-05-06 | Disposition: A | Discharge status: Home / Self Care | Source: Ambulatory Visit

## 2024-05-06 ENCOUNTER — Ambulatory Visit
Admission: RE | Admit: 2024-05-06 | Discharge: 2024-05-06 | Disposition: A | Discharge status: Home / Self Care | Source: Ambulatory Visit | Attending: Gastroenterology | Admitting: Gastroenterology

## 2024-05-06 DIAGNOSIS — R238 Other skin changes: Secondary | ICD-10-CM

## 2024-05-06 DIAGNOSIS — R1909 Other intra-abdominal and pelvic swelling, mass and lump: Secondary | ICD-10-CM

## 2024-05-06 DIAGNOSIS — R1011 Right upper quadrant pain: Secondary | ICD-10-CM

## 2024-05-12 ENCOUNTER — Ambulatory Visit

## 2024-05-14 ENCOUNTER — Other Ambulatory Visit: Payer: Self-pay | Admitting: Cardiology

## 2024-05-23 ENCOUNTER — Ambulatory Visit

## 2024-05-23 VITALS — BP 128/76 | HR 78

## 2024-05-23 DIAGNOSIS — R1909 Other intra-abdominal and pelvic swelling, mass and lump: Secondary | ICD-10-CM | POA: Diagnosis not present

## 2024-05-23 DIAGNOSIS — Z09 Encounter for follow-up examination after completed treatment for conditions other than malignant neoplasm: Secondary | ICD-10-CM

## 2024-05-23 NOTE — Progress Notes (Signed)
 Consultation Note  68 year old female with history of right groin mass. The patient presents for discussion about mass in the right groin. She has a past medical and surgical history as noted in the chart.  Findings: Ultrasound imaging identified the mass as normal lymph nodes. No signs of malignancy or abnormal lymphadenopathy were observed.  Assessment and Plan: There is no indication for surgical intervention at this time. The patient was advised to continue routine monitoring with her primary care physician. All questions were addressed to the patient's satisfaction.  Additional Notes: The patient expressed continued interest in HALO laser treatment. A quote will be provided to her today.  Latitia Housewright M. Kanon Colunga, MD [facility]

## 2024-06-17 ENCOUNTER — Other Ambulatory Visit: Payer: Self-pay

## 2024-06-19 MED ORDER — AMLODIPINE BESYLATE 10 MG PO TABS: 10.0000 mg | ORAL_TABLET | Freq: Every day | ORAL | 0 refills | Status: DC

## 2024-06-25 ENCOUNTER — Encounter: Payer: Self-pay | Admitting: Cardiology

## 2024-06-25 ENCOUNTER — Ambulatory Visit: Attending: Cardiology | Admitting: Cardiology

## 2024-06-25 VITALS — BP 138/82 | HR 77 | Ht 61.0 in | Wt 221.2 lb

## 2024-06-25 DIAGNOSIS — R0609 Other forms of dyspnea: Secondary | ICD-10-CM | POA: Insufficient documentation

## 2024-06-25 DIAGNOSIS — I340 Nonrheumatic mitral (valve) insufficiency: Secondary | ICD-10-CM | POA: Diagnosis present

## 2024-06-25 DIAGNOSIS — I10 Essential (primary) hypertension: Secondary | ICD-10-CM | POA: Insufficient documentation

## 2024-06-25 DIAGNOSIS — I77819 Aortic ectasia, unspecified site: Secondary | ICD-10-CM | POA: Diagnosis present

## 2024-06-25 DIAGNOSIS — I2510 Atherosclerotic heart disease of native coronary artery without angina pectoris: Secondary | ICD-10-CM | POA: Insufficient documentation

## 2024-06-25 DIAGNOSIS — E785 Hyperlipidemia, unspecified: Secondary | ICD-10-CM | POA: Insufficient documentation

## 2024-06-25 MED ORDER — METOPROLOL TARTRATE 100 MG PO TABS: ORAL_TABLET | ORAL | 0 refills | Status: AC

## 2024-06-25 NOTE — Progress Notes (Signed)
 Cardiology Office Note:    Date:  06/25/2024   ID:  Ellayna  Wynona, Duhamel 1956/04/20, MRN [id number]  PCP:  Delayne Artist PARAS, MD  Cardiologist:  Lonni LITTIE Nanas, MD  Electrophysiologist:  None   Referring MD: Delayne Artist PARAS, MD   Chief Complaint  Patient presents with   Coronary Artery Disease    History of Present Illness:    An  [name] is a 68 y.o. female with a hx of hypertension, GERD, OSA who presents for follow-up.  She was referred by Comer Rouleau, NP for evaluation of shortness of breath, initially seen 06/16/22.  Underwent Lexiscan  Myoview  09/08/2017 which showed no reversible perfusion defect, possible basal septal fixed defect thought to represent attenuation artifact, EF 52%.  She reports she has been having shortness of breath, especially with walking uphill.  She denies any chest pain.  Reports she swims 3 times per week, has had shortness of breath while exerting herself.  Tries to do 30 minutes in the pool but has been limited recently by dyspnea.  Reports some lightheadedness but denies any syncope, attributes to vertigo.  Reports some lower extremity edema.  Reports rare palpitations.  No smoking history.  No history of heart disease in her immediate family.  LDL 68 on 01/16/2022.  Echocardiogram 07/03/2022 showed EF 55 to 60%, normal RV function, mild to moderate mitral regurgitation, mild aortic regurgitation, mild dilatation of ascending aorta measuring 39 mm.  Coronary CTA on 07/04/2022 showed nonobstructive CAD with mild stenosis in proximal LAD, calcium  score 87 (81st percentile).  Echo 01/18/2024 showed normal biventricular function, dilated ascending aorta measuring 40 mm, mild AI.  Since last clinic visit, she reports she has had significant worsening in her dyspnea on exertion.  Denies any chest pain.  She has not been taking her statin.  BP Readings from Last 3 Encounters:  06/25/24 138/82  05/23/24 128/76  04/15/24 131/78      Past Medical History:  Diagnosis Date   Arthritis    Dyspnea    with exertion   Family history of adverse reaction to anesthesia    mother- had problems with knee surgery but does not remember what   GERD (gastroesophageal reflux disease)    Heart disorder    Heart Muscle Spasms   Heart murmur    as a child   Hypertension    Hypothyroidism    Pancreatic insufficiency    PONV (postoperative nausea and vomiting)    Pre-diabetes    Sleep apnea    cpap    Past Surgical History:  Procedure Laterality Date   CESAREAN SECTION     ESOPHAGOGASTRODUODENOSCOPY (EGD) WITH PROPOFOL  N/A 01/01/2013   Procedure: ESOPHAGOGASTRODUODENOSCOPY (EGD) WITH PROPOFOL ;  Surgeon: Elsie Cree, MD;  Location: [facility];  Service: Endoscopy;  Laterality: N/A;   EUS N/A 01/01/2013   Procedure: ESOPHAGEAL ENDOSCOPIC ULTRASOUND (EUS) RADIAL;  Surgeon: Elsie Cree, MD;  Location: [facility];  Service: Endoscopy;  Laterality: N/A;   KNEE ARTHROPLASTY Right 12/20/2022   Procedure: COMPUTER ASSISTED TOTAL KNEE ARTHROPLASTY;  Surgeon: Fidel Rogue, MD;  Location: [facility];  Service: Orthopedics;  Laterality: Right;  160   LEFT HEART CATH AND CORONARY ANGIOGRAPHY  2010   NASAL SINUS SURGERY     TUBAL LIGATION  2001    Current Medications: Current Meds  Medication Sig   albuterol  (VENTOLIN  HFA) 108 (90 Base) MCG/ACT inhaler Inhale 2 puffs into the lungs every 6 (six) hours as needed for wheezing or shortness of breath.  Ascorbic Acid (VITAMIN C) 1000 MG tablet Take 1,000 mg by mouth daily.   atorvastatin  (LIPITOR) 10 MG tablet Take 1 tablet (10 mg total) by mouth daily. (Patient taking differently: Take 10 mg by mouth every evening.)   Azelastine HCl 137 MCG/SPRAY SOLN Place 2 sprays into both nostrils every evening.   CREON  36000-114000 units CPEP capsule Take 72,000 Units by mouth 3 (three) times daily with meals.   hydrochlorothiazide (HYDRODIURIL) 25 MG tablet Take 25 mg by mouth every  morning.   levothyroxine  (SYNTHROID , LEVOTHROID) 50 MCG tablet Take 50 mcg by mouth daily before breakfast.   losartan  (COZAAR ) 100 MG tablet Take 100 mg by mouth daily before breakfast.    metoprolol  tartrate (LOPRESSOR ) 100 MG tablet Take 1 tablet 2 hours prior to procedure   Multiple Vitamins-Minerals (MULTIVITAMIN WITH MINERALS) tablet Take 1 tablet by mouth daily.   traZODone  (DESYREL ) 50 MG tablet Take 1/2 to 1 whole tablet by mouth at bedtime as needed for sleep. Do not drive after taking     Allergies:   Tylenol  [acetaminophen ]   Social History   Socioeconomic History   Marital status: Married    Spouse name: Not on file   Number of children: Not on file   Years of education: Not on file   Highest education level: Not on file  Occupational History   Not on file  Tobacco Use   Smoking status: Never   Smokeless tobacco: Never  Vaping Use   Vaping status: Never Used  Substance and Sexual Activity   Alcohol  use: Yes    Comment: occassionally   Drug use: No   Sexual activity: Not on file  Other Topics Concern   Not on file  Social History Narrative   Not on file   Social Drivers of Health   Financial Resource Strain: Not on file  Food Insecurity: No Food Insecurity (12/20/2022)   Hunger Vital Sign    Worried About Running Out of Food in the Last Year: Never true    Ran Out of Food in the Last Year: Never true  Transportation Needs: No Transportation Needs (12/20/2022)   PRAPARE - Administrator, Civil Service (Medical): No    Lack of Transportation (Non-Medical): No  Physical Activity: Not on file  Stress: Not on file  Social Connections: Not on file     Family History: The patient's family history includes Diabetes in her father; Heart disease in her father; Hyperlipidemia in her father.  ROS:   Please see the history of present illness.     All other systems reviewed and are negative.  EKGs/Labs/Other Studies Reviewed:    The following studies  were reviewed today:   EKG:   06/16/2022: Normal sinus rhythm, rate 71, Q wave in aVL, poor R wave progression 01/09/2023: Sinus tachycardia, rate 101, nonspecific T wave flattening 05/18/2023: Normal sinus rhythm, rate 81, LVH 06/26/2023: Normal sinus rhythm, rate 77, T wave inversion in lead III, 1 mm ST depressions in leads V5/6  Recent Labs: No results found for requested labs within last 365 days.  Recent Lipid Panel    Component Value Date/Time   CHOL 152 09/08/2017 0524   TRIG 37 09/08/2017 0524   HDL 57 09/08/2017 0524   CHOLHDL 2.7 09/08/2017 0524   VLDL 7 09/08/2017 0524   LDLCALC 88 09/08/2017 0524    Physical Exam:    VS:  BP 138/82   Pulse 77   Ht 5' 1 (1.549 m)  Wt 221 lb 3.2 oz (100.3 kg)   SpO2 95%   BMI 41.80 kg/m     Wt Readings from Last 3 Encounters:  06/25/24 221 lb 3.2 oz (100.3 kg)  04/15/24 223 lb (101.2 kg)  09/07/23 226 lb (102.5 kg)     GEN:  Well nourished, well developed in no acute distress HEENT: Normal NECK: No JVD; No carotid bruits LYMPHATICS: No lymphadenopathy CARDIAC: RRR, no murmurs, rubs, gallops RESPIRATORY:  Clear to auscultation without rales, wheezing or rhonchi  ABDOMEN: Soft, non-tender, non-distended MUSCULOSKELETAL:  No edema; No deformity  SKIN: Warm and dry NEUROLOGIC:  Alert and oriented x 3 PSYCHIATRIC:  Normal affect   ASSESSMENT:    1. DOE (dyspnea on exertion)   2. CAD in native artery   3. Essential hypertension   4. Mitral valve insufficiency, unspecified etiology   5. Morbid obesity (HCC)   6. Hyperlipidemia, unspecified hyperlipidemia type   7. Dilation of aorta      PLAN:    CAD: Reported dyspnea on exertion.  Echocardiogram 07/03/2022 showed EF 55 to 60%, normal RV function, mild to moderate mitral regurgitation, mild aortic regurgitation, mild dilatation of ascending aorta measuring 39 mm.  Coronary CTA on 07/04/2022 showed nonobstructive CAD with mild stenosis in proximal LAD, calcium  score 87  (81st percentile). - She reports significant worsening in dyspnea on exertion.  Recommend coronary CTA to evaluate for progression of her CAD.  Will give Lopressor  100 mg prior to study  Right lower extremity edema: 1+ right lower extremity edema, following knee surgery.  Lower extremity duplex showed no DVT on 01/16/2023  Mitral regurgitation: Mild to moderate on echocardiogram 06/2022.  Trivial on echo 01/2024  Hypertension: On losartan  100 mg daily and amlodipine  10 mg daily.  Appears controlled  OSA: on CPAP, reports compliance  Morbid obesity: Body mass index is 41.8 kg/m.  Diet/exercise encouraged  Hyperlipidemia: LDL 99 on 08/22/2022.  Goal LDL less than 70 given CAD as above.  Had prescribed atorvastatin  10 mg daily but reports she has not been taking.  Encouraged her to take her statin.  Update lipid panel  Dilated aorta: Ascending aorta measured 40 mm on echo 01/2024, will monitor  RTC in 6 months   Medication Adjustments/Labs and Tests Ordered: Current medicines are reviewed at length with the patient today.  Concerns regarding medicines are outlined above.  Orders Placed This Encounter  Procedures   CT CORONARY MORPH W/CTA COR W/SCORE W/CA W/CM &/OR WO/CM   Comprehensive Metabolic Panel (CMET)   Magnesium   Lipid panel   EKG 12-Lead   Meds ordered this encounter  Medications   metoprolol  tartrate (LOPRESSOR ) 100 MG tablet    Sig: Take 1 tablet 2 hours prior to procedure    Dispense:  1 tablet    Refill:  0    Patient Instructions  Medication Instructions:  Your physician recommends that you continue on your current medications as directed. Please refer to the Current Medication list given to you today. *If you need a refill on your cardiac medications before your next appointment, please call your pharmacy*  Lab Work: TODAY-CMET, MAG & LIPIDS If you have labs (blood work) drawn today and your tests are completely normal, you will receive your results only  by: MyChart Message (if you have MyChart) OR A paper copy in the mail If you have any lab test that is abnormal or we need to change your treatment, we will call you to review the results.  Testing/Procedures:  CORONARY CTA INSTRUCTIONS BELOW  Follow-Up: At [facility], you and your health needs are our priority.  As part of our continuing mission to provide you with exceptional heart care, our providers are all part of one team.  This team includes your primary Cardiologist (physician) and Advanced Practice Providers or APPs (Physician Assistants and Nurse Practitioners) who all work together to provide you with the care you need, when you need it.  Your next appointment:   6 month(s)  Provider:   Lonni LITTIE Nanas, MD or ANY APP  We recommend signing up for the patient portal called MyChart.  Sign up information is provided on this After Visit Summary.  MyChart is used to connect with patients for Virtual Visits (Telemedicine).  Patients are able to view lab/test results, encounter notes, upcoming appointments, etc.  Non-urgent messages can be sent to your provider as well.   To learn more about what you can do with MyChart, go to [email or web address].   Other Instructions    Your cardiac CT will be scheduled at one of the below locations:   [facility] [address]  [place], [place] [address]  If scheduled at the [facility] at [address], please enter the parking lot using the [address] entrance and use the FREE valet service at the patient drop-off area. Enter the building and check-in with registration on the main floor.  Please follow these instructions carefully (unless otherwise directed):  An IV will be required for this test and Nitroglycerin  will be given.     On the Night Before the Test: Be sure to Drink plenty of water. Do not consume any caffeinated/decaffeinated beverages or chocolate 12  hours prior to your test. Do not take any antihistamines 12 hours prior to your test.  On the Day of the Test: Drink plenty of water until 1 hour prior to the test. Do not eat any food 1 hour prior to test. You may take your regular medications prior to the test.  Take metoprolol  (Lopressor ) two hours prior to test. If you take Furosemide/Hydrochlorothiazide/Spironolactone/Chlorthalidone, please HOLD on the morning of the test. Patients who wear a continuous glucose monitor MUST remove the device prior to scanning. FEMALES- please wear underwire-free bra if available, avoid dresses & tight clothing       After the Test: Drink plenty of water. After receiving IV contrast, you may experience a mild flushed feeling. This is normal. On occasion, you may experience a mild rash up to 24 hours after the test. This is not dangerous. If this occurs, you can take Benadryl  25 mg, Zyrtec, Claritin, or Allegra and increase your fluid intake. (Patients taking Tikosyn should avoid Benadryl , and may take Zyrtec, Claritin, or Allegra) If you experience trouble breathing, this can be serious. If it is severe call 911 IMMEDIATELY. If it is mild, please call our office.  We will call to schedule your test 2-4 weeks out understanding that some insurance companies will need an authorization prior to the service being performed.   For more information and frequently asked questions, please visit our website : [email or web address]  For non-scheduling related questions, please contact the cardiac imaging nurse navigator should you have any questions/concerns: Cardiac Imaging Nurse Navigators Direct Office Dial: [phone]   For scheduling needs, including cancellations and rescheduling, please call Brittany, [phone].           Signed, Lonni LITTIE Nanas, MD  06/25/2024 5:08 PM    [facility]

## 2024-06-25 NOTE — Patient Instructions (Addendum)
 Medication Instructions:  Your physician recommends that you continue on your current medications as directed. Please refer to the Current Medication list given to you today. *If you need a refill on your cardiac medications before your next appointment, please call your pharmacy*  Lab Work: TODAY-CMET, MAG & LIPIDS If you have labs (blood work) drawn today and your tests are completely normal, you will receive your results only by: MyChart Message (if you have MyChart) OR A paper copy in the mail If you have any lab test that is abnormal or we need to change your treatment, we will call you to review the results.  Testing/Procedures: CORONARY CTA INSTRUCTIONS BELOW  Follow-Up: At [facility], you and your health needs are our priority.  As part of our continuing mission to provide you with exceptional heart care, our providers are all part of one team.  This team includes your primary Cardiologist (physician) and Advanced Practice Providers or APPs (Physician Assistants and Nurse Practitioners) who all work together to provide you with the care you need, when you need it.  Your next appointment:   6 month(s)  Provider:   Lonni LITTIE Nanas, MD or ANY APP  We recommend signing up for the patient portal called MyChart.  Sign up information is provided on this After Visit Summary.  MyChart is used to connect with patients for Virtual Visits (Telemedicine).  Patients are able to view lab/test results, encounter notes, upcoming appointments, etc.  Non-urgent messages can be sent to your provider as well.   To learn more about what you can do with MyChart, go to [email or web address].   Other Instructions    Your cardiac CT will be scheduled at one of the below locations:   [facility] [address]  [place], [place] [address]  If scheduled at the [facility] at [address], please enter the parking lot using the [address]  street entrance and use the FREE valet service at the patient drop-off area. Enter the building and check-in with registration on the main floor.  Please follow these instructions carefully (unless otherwise directed):  An IV will be required for this test and Nitroglycerin  will be given.     On the Night Before the Test: Be sure to Drink plenty of water. Do not consume any caffeinated/decaffeinated beverages or chocolate 12 hours prior to your test. Do not take any antihistamines 12 hours prior to your test.  On the Day of the Test: Drink plenty of water until 1 hour prior to the test. Do not eat any food 1 hour prior to test. You may take your regular medications prior to the test.  Take metoprolol  (Lopressor ) two hours prior to test. If you take Furosemide/Hydrochlorothiazide/Spironolactone/Chlorthalidone, please HOLD on the morning of the test. Patients who wear a continuous glucose monitor MUST remove the device prior to scanning. FEMALES- please wear underwire-free bra if available, avoid dresses & tight clothing       After the Test: Drink plenty of water. After receiving IV contrast, you may experience a mild flushed feeling. This is normal. On occasion, you may experience a mild rash up to 24 hours after the test. This is not dangerous. If this occurs, you can take Benadryl  25 mg, Zyrtec, Claritin, or Allegra and increase your fluid intake. (Patients taking Tikosyn should avoid Benadryl , and may take Zyrtec, Claritin, or Allegra) If you experience trouble breathing, this can be serious. If it is severe call 911 IMMEDIATELY.  If it is mild, please call our office.  We will call to schedule your test 2-4 weeks out understanding that some insurance companies will need an authorization prior to the service being performed.   For more information and frequently asked questions, please visit our website : [email or web address]  For non-scheduling related questions, please  contact the cardiac imaging nurse navigator should you have any questions/concerns: Cardiac Imaging Nurse Navigators Direct Office Dial: [phone]   For scheduling needs, including cancellations and rescheduling, please call Brittany, [phone].

## 2024-06-26 ENCOUNTER — Ambulatory Visit: Payer: Self-pay | Admitting: Cardiology

## 2024-06-26 DIAGNOSIS — E785 Hyperlipidemia, unspecified: Secondary | ICD-10-CM

## 2024-06-26 LAB — COMPREHENSIVE METABOLIC PANEL WITH GFR
ALT: 17 IU/L (ref 0–32)
AST: 17 IU/L (ref 0–40)
Albumin: 4.3 g/dL (ref 3.9–4.9)
Alkaline Phosphatase: 88 IU/L (ref 49–135)
BUN/Creatinine Ratio: 21 (ref 12–28)
BUN: 18 mg/dL (ref 8–27)
Bilirubin Total: 0.3 mg/dL (ref 0.0–1.2)
CO2: 25 mmol/L (ref 20–29)
Calcium: 9.7 mg/dL (ref 8.7–10.3)
Chloride: 100 mmol/L (ref 96–106)
Creatinine, Ser: 0.86 mg/dL (ref 0.57–1.00)
Globulin, Total: 2.9 g/dL (ref 1.5–4.5)
Glucose: 91 mg/dL (ref 70–99)
Potassium: 4.1 mmol/L (ref 3.5–5.2)
Sodium: 140 mmol/L (ref 134–144)
Total Protein: 7.2 g/dL (ref 6.0–8.5)
eGFR: 74 mL/min/1.73 (ref >59)

## 2024-06-26 LAB — MAGNESIUM: Magnesium: 1.9 mg/dL (ref 1.6–2.3)

## 2024-06-26 LAB — LIPID PANEL
Chol/HDL Ratio: 3.8 ratio (ref 0.0–4.4)
Cholesterol, Total: 168 mg/dL (ref 100–199)
HDL: 44 mg/dL (ref >39)
LDL Chol Calc (NIH): 94 mg/dL (ref 0–99)
Triglycerides: 175 mg/dL — ABNORMAL HIGH (ref 0–149)
VLDL Cholesterol Cal: 30 mg/dL (ref 5–40)

## 2024-07-09 ENCOUNTER — Telehealth (HOSPITAL_COMMUNITY): Payer: Self-pay | Admitting: Emergency Medicine

## 2024-07-09 NOTE — Telephone Encounter (Signed)
 Unable to complete call / invalid number? Camie Shutter RN Navigator Cardiac Imaging [facility] [phone] Office  [phone] Cell

## 2024-07-11 ENCOUNTER — Ambulatory Visit (HOSPITAL_COMMUNITY)

## 2024-07-15 ENCOUNTER — Telehealth: Payer: Self-pay | Admitting: Cardiology

## 2024-07-15 NOTE — Telephone Encounter (Signed)
 Patient would like to know how soon to take medications prior to 12/15 CT [facility].

## 2024-07-28 ENCOUNTER — Ambulatory Visit (HOSPITAL_COMMUNITY)
Admission: RE | Admit: 2024-07-28 | Discharge: 2024-07-28 | Disposition: A | Discharge status: Home / Self Care | Source: Ambulatory Visit | Attending: Cardiology | Admitting: Cardiology

## 2024-07-28 DIAGNOSIS — I2510 Atherosclerotic heart disease of native coronary artery without angina pectoris: Secondary | ICD-10-CM | POA: Diagnosis not present

## 2024-07-28 DIAGNOSIS — R0609 Other forms of dyspnea: Secondary | ICD-10-CM

## 2024-07-28 DIAGNOSIS — I77819 Aortic ectasia, unspecified site: Secondary | ICD-10-CM

## 2024-07-28 DIAGNOSIS — I340 Nonrheumatic mitral (valve) insufficiency: Secondary | ICD-10-CM | POA: Insufficient documentation

## 2024-07-28 DIAGNOSIS — I10 Essential (primary) hypertension: Secondary | ICD-10-CM | POA: Insufficient documentation

## 2024-07-28 DIAGNOSIS — E785 Hyperlipidemia, unspecified: Secondary | ICD-10-CM | POA: Insufficient documentation

## 2024-07-28 MED ORDER — NITROGLYCERIN 0.4 MG SL SUBL
0.8000 mg | SUBLINGUAL_TABLET | Freq: Once | SUBLINGUAL | Status: AC
  Administered 2024-07-28: 13:00:00 0.8 mg via SUBLINGUAL

## 2024-07-28 MED ORDER — IOHEXOL 350 MG/ML SOLN
100.0000 mL | Freq: Once | INTRAVENOUS | Status: AC | PRN
  Administered 2024-07-28: 13:00:00 100 mL via INTRAVENOUS

## 2024-07-29 ENCOUNTER — Ambulatory Visit: Admitting: Nurse Practitioner

## 2024-07-29 ENCOUNTER — Encounter: Payer: Self-pay | Admitting: Nurse Practitioner

## 2024-07-29 VITALS — BP 129/84 | HR 81 | Ht 61.0 in | Wt 217.0 lb

## 2024-07-29 DIAGNOSIS — F5101 Primary insomnia: Secondary | ICD-10-CM

## 2024-07-29 DIAGNOSIS — J069 Acute upper respiratory infection, unspecified: Secondary | ICD-10-CM | POA: Insufficient documentation

## 2024-07-29 DIAGNOSIS — G4733 Obstructive sleep apnea (adult) (pediatric): Secondary | ICD-10-CM

## 2024-07-29 DIAGNOSIS — F419 Anxiety disorder, unspecified: Secondary | ICD-10-CM

## 2024-07-29 DIAGNOSIS — R0602 Shortness of breath: Secondary | ICD-10-CM

## 2024-07-29 MED ORDER — TRAZODONE HCL 50 MG PO TABS: ORAL_TABLET | ORAL | 3 refills | Status: AC

## 2024-07-29 NOTE — Patient Instructions (Addendum)
 Continue CPAP auto 5-20 cmH2O every night, minimum of 4-6 hours a night.  Be aware of reduced alertness and do not drive or operate heavy machinery if experiencing this or drowsiness.    Continue Trazodone  25-50 mg (1/2 tab to 1 tab) At bedtime as needed for sleep. Take 30 minutes prior to bedtime. Put your CPAP on within 15 minutes after taking to ensure you don't fall asleep without it on. Do not drive after taking.   Continue Albuterol  inhaler 2 puffs every 6 hours as needed for shortness of breath or wheezing. Notify if symptoms persist despite rescue inhaler/neb use.    Exercise 150 min/week  Mucinex DM over the counter as needed for the cough   Follow up in 1 year with Dr. Neda. If symptoms worsen, please contact office for sooner follow up or seek emergency care

## 2024-07-29 NOTE — Assessment & Plan Note (Signed)
 BMI 42. Healthy weight loss encouraged

## 2024-07-29 NOTE — Assessment & Plan Note (Addendum)
 Gradual improvement with mild residual cough, likely upper airway in nature. Lung exam benign. Encouraged supportive care measures. Advised to notify if symptoms fail to improve or worsen. No SABA use.

## 2024-07-29 NOTE — Assessment & Plan Note (Signed)
Mild restrictive defect with normal DLCO, likely related to obesity. She is working on healthy weight loss measures. Has not required SABA recently.

## 2024-07-29 NOTE — Assessment & Plan Note (Signed)
 Severe OSA on CPAP. Excellent compliance and control. Receives benefit from use. Aware of proper care/use. Understands risks of untreated OSA. Healthy weight loss encouraged. Safe driving practices reviewed.  Patient Instructions  Continue CPAP auto 5-20 cmH2O every night, minimum of 4-6 hours a night.  Be aware of reduced alertness and do not drive or operate heavy machinery if experiencing this or drowsiness.    Continue Trazodone  25-50 mg (1/2 tab to 1 tab) At bedtime as needed for sleep. Take 30 minutes prior to bedtime. Put your CPAP on within 15 minutes after taking to ensure you don't fall asleep without it on. Do not drive after taking.   Continue Albuterol  inhaler 2 puffs every 6 hours as needed for shortness of breath or wheezing. Notify if symptoms persist despite rescue inhaler/neb use.    Exercise 150 min/week  Mucinex DM over the counter as needed for the cough   Follow up in 1 year with Dr. Neda. If symptoms worsen, please contact office for sooner follow up or seek emergency care

## 2024-07-29 NOTE — Progress Notes (Signed)
 @Patient  ID: [name]  [name], female    DOB: [birth year], 68 y.o.   MRN: [id number]  Chief Complaint  Patient presents with   Medical Management of Chronic Issues   Obstructive Sleep Apnea    Doing well with CPAP and no new co's.     Referring provider: Delayne Artist PARAS, MD  HPI: 68 year old female, never smoker followed for OSA. She is a patient of [name] and last seen in office on 09/07/2023 by [name]. Past medical history significant for HTN, GERD, allergic rhinitis, pancreatic insufficiency.   TEST/EVENTS:  10/12/2019 CTA chest: no PE. No LAD. Lungs are clear.  02/15/2022 HST: AHI 45.9, SpO2 low 80% Severe OSA 06/01/2022 PFT: FVC 78, FEV1 83, ratio 85, TLC 94, DLCO 92.  No BD  10/24/2021: OV with Dr. Neda. Seen 2 years ago but unable to complete sleep study at the time. Has issues with sleep and snoring. Sleep nonrestorative. Weight stable. Concerned for OSA - HST ordered.   03/08/2022: OV with Shari Messman NP to discuss sleep study results which showed severe obstructive sleep apnea. She continues to have daytime fatigue symptoms and feels like her sleep is not restful. Wakes in the morning feeling groggy and with a dry mouth. She denies any sleep parasomnias, morning headaches, or drowsy driving. She is hoping to get started on CPAP therapy - order sent to DME for new start auto CPAP 5-20 cmH2O.   03/29/2022: OV with [name] to discuss issues with her CPAP/sleep. She received her mask last week. She is having some trouble adjusting to wearing her CPAP. Feels like she gets distracted by the mask and her mind is all over the place as she's trying to fall asleep. She has started turning on the TV for a few minutes after she puts her mask on, which has helped some. She has only worn it three nights, one of which was for a little over 4 hours. She did notice a difference in how well she slept and how she felt more rested after that night versus others. She also is concerned that her  mask may be too tight. She denies any morning headaches, sleep parasomnias, or drowsy driving. She doesn't feel like she's having any significant mask leaks.   05/31/2022: OV with Shari Tillis NP for follow up. Since I saw her last, she has been doing significantly better with her CPAP. She is wearing it nightly. Feels like she sleeps much better now. She occasionally uses the trazodone  to help her fall asleep. She's not having any issues with her machine; occasionally notices a little leak. Wakes feeling better rested. Denies morning headaches or drowsy driving.  Excellent compliance.  Residual AHI 0.7 She has been struggling with some shortness of breath recently. She says that it's been going on for months to years now. She associated it more so with her weight but now is starting to get concerned. Usually only occurs with exertion but she will occasionally feel like she can't get a big deep breath in at rest. Rarely has some wheezing. She denies any cough or chest congestion. She does occasionally have associated dizziness and palpitations. She denies orthopnea, PND or chest pain. She has seen a cardiologist years ago; would like to go back. She has not pulmonary history; denies any childhood asthma. No autoimmune diseases. No significant environmental or occupational exposures. Never tried inhalers.  Referred to cardiology.  PFTs ordered for further evaluation.  Provided with trial of albuterol  as needed.  06/08/2022: OV with Shari Radu NP for follow-up to discuss pulmonary function testing.  PFTs were overall unremarkable.  She did have a very mild restriction with FVC 78%; however, TLC remain normal.  She tells me today that she has been feeling pretty well since I saw her last.  She does feel like her shortness of breath is somewhat better and still only occurs with exertion.  She has used her albuterol  a few times, feels like it provides some relief. She's only used it a handful of times. No significant cough, chest  congestion, or wheezing. Denies orthopnea, PND, syncope. She still has some occasional palpitations, usually with exertion. Going to see cardiology next week. She does want to work on weight loss measures; thinks this is the cause of her getting winded.  Wearing CPAP nightly. Feels much better using it. Fatigue has significantly improved. Denies morning headaches or drowsy driving.   2/75/7975: OV with Shari Bellis NP for follow up. She had knee surgery about 2 months ago. Recovering well but having some tightness that has been slow to resolve. She is seeing her surgeon Tuesday. From a breathing standpoint, she has been doing well. Hasn't required her rescue inhaler.  She is sleeping with her CPAP nightly. She feels like this has been life changing for her. She's able to sleep through the night, most nights, and wakes feeling rested. Energy levels are better. She will occasionally take 1/2-1 tab of trazodone , which helps her shut off her brain and fall asleep. Doesn't require this every night. No mood changes or side effects with this. No residual AM grogginess. She denies any sleep parasomnias/paralysis or drowsy driving. Still having some leaks but they don't tend to bother her.  02/05/2023-03/06/2023: CPAP 5-20 cmH2O 30/30 days; 100% > 4 hr; average use 9 hr 26 min Pressure 95th 13.3 Leaks 95th 35.9 AHI 1.3  09/07/2023: OV with Shari Levay NP Patient presents today for follow up. She's been doing well with her CPAP. Wears it nightly. Sleeps well with it. Feels much better rested with her CPAP. Energy levels are good during the day for the most part. The winter has made her not want to go out to the gym or exercise as much with it being so cold. She's trying to motivate herself more. She denies any issues with drowsy driving or morning headaches. No issues with mask fit or leaks. She takes trazodone  at night for sleep, which works well for her. Usually only takes 1/2 tablet but sometimes takes 1. It also helps calm her  mind. She denies any mood changes.  She feels like breathing has been doing well for the most part. Rarely uses the albuterol . No cough or wheezing. 08/08/2023-09/06/2023: CPAP 5-20 cmH2O 30/30 days; 100% >4 hr; average use 10 hr  Pressure 95th 13.7 Leaks 95th 31.7 AHI 1.4  07/29/2024: Today - follow up Discussed the use of AI scribe software for clinical note transcription with the patient, who gave verbal consent to proceed.  History of Present Illness [name]  Shari Gossen is a 68 year old female with obstructive sleep apnea who presents for CPAP management and supplies renewal.  She reports that her CPAP supplies were recently ordered, but the new cushion feels too tight. She is considering contacting the supplier to request a medium-large size instead of the small-medium she received. She might use her old cushion as it feels more comfortable in the interim. She sleeps well with her CPAP otherwise. Uses it nightly. No issues with drowsy driving. No issues  with pressure settings or leaks. Energy levels are good.   She is currently using trazodone , taking half a dose at night to aid sleep. She sometimes takes another half if she is not dozing off initially. No morning hangover effect. No mood changes. Feels it works well for her.   She has been informed of having a fatty liver, which prompted her to lose twelve pounds. She is actively managing her diet by avoiding high-fat foods and consuming lean proteins like chicken, fish, and ground turkey. She mentions feeling better with a gluten-free diet, as gluten makes her feel bloated.  She shares her recent travel experiences in [place].  No issues with her breathing. Did have a recent cold with some minimal residual coughing. No significant sputum production. No fevers, headaches, ear pain, sore throat, sinus tenderness. Not using anything over the counter.    Allergies  Allergen Reactions   Tylenol  [email or web address] ] Other (See Comments)     Makes her feel jumpy.    Immunization History  Administered Date(s) Administered   PFIZER(Purple Top)SARS-COV-2 Vaccination 11/25/2019, 05/05/2020   Tdap 06/28/2016, 08/14/2016   Zoster, Live 06/22/2011    Past Medical History:  Diagnosis Date   Arthritis    Dyspnea    with exertion   Family history of adverse reaction to anesthesia    mother- had problems with knee surgery but does not remember what   GERD (gastroesophageal reflux disease)    Heart disorder    Heart Muscle Spasms   Heart murmur    as a child   Hypertension    Hypothyroidism    Pancreatic insufficiency    PONV (postoperative nausea and vomiting)    Pre-diabetes    Sleep apnea    cpap    Tobacco History: Social History   Tobacco Use  Smoking Status Never  Smokeless Tobacco Never   Counseling given: Not Answered   Outpatient Medications Prior to Visit  Medication Sig Dispense Refill   albuterol  (VENTOLIN  HFA) 108 (90 Base) MCG/ACT inhaler Inhale 2 puffs into the lungs every 6 (six) hours as needed for wheezing or shortness of breath. 8 g 6   Ascorbic Acid (VITAMIN C) 1000 MG tablet Take 1,000 mg by mouth daily.     atorvastatin  (LIPITOR) 10 MG tablet Take 1 tablet (10 mg total) by mouth daily. (Patient taking differently: Take 10 mg by mouth every evening.) 90 tablet 3   Azelastine HCl 137 MCG/SPRAY SOLN Place 2 sprays into both nostrils every evening.     CREON  36000-114000 units CPEP capsule Take 72,000 Units by mouth 3 (three) times daily with meals.     hydrochlorothiazide (HYDRODIURIL) 25 MG tablet Take 25 mg by mouth every morning.     levothyroxine  (SYNTHROID , LEVOTHROID) 50 MCG tablet Take 50 mcg by mouth daily before breakfast.     losartan  (COZAAR ) 100 MG tablet Take 100 mg by mouth daily before breakfast.      metoprolol  tartrate (LOPRESSOR ) 100 MG tablet Take 1 tablet 2 hours prior to procedure 1 tablet 0   Multiple Vitamins-Minerals (MULTIVITAMIN WITH MINERALS) tablet Take 1 tablet  by mouth daily.     traZODone  (DESYREL ) 50 MG tablet Take 1/2 to 1 whole tablet by mouth at bedtime as needed for sleep. Do not drive after taking 90 tablet 3   No facility-administered medications prior to visit.     Review of Systems: as above    Physical Exam:  BP 129/84   Pulse 81   Ht 5' 1 (  1.549 m) Comment: per pt  Wt 217 lb (98.4 kg)   SpO2 97% Comment: on RA  BMI 41.00 kg/m   GEN: Pleasant, interactive, well-appearing; obese; in no acute distress. HEENT:  Normocephalic and atraumatic. PERRLA. Sclera white. Nasal turbinates pink, moist and patent bilaterally. No rhinorrhea present. Oropharynx pink and moist, without exudate or edema. No lesions, ulcerations, or postnasal drip.  NECK:  Supple w/ fair ROM. No lymphadenopathy.   CV: RRR, no m/r/g PULMONARY:  Unlabored, regular breathing. Clear bilaterally A&P w/o wheezes/rales/rhonchi. No accessory muscle use. No dullness to percussion. GI: BS present and normoactive. Soft, non-tender to palpation.  MSK: No erythema, warmth or tenderness.  Neuro: A/Ox3. No focal deficits noted.   Skin: Warm, no lesions or rashe Psych: Normal affect and behavior. Judgement and thought content appropriate.     Lab Results:  CBC    Component Value Date/Time   WBC 14.1 (H) 12/21/2022 0345   RBC 3.93 12/21/2022 0345   HGB 10.1 (L) 12/21/2022 0345   HCT 31.4 (L) 12/21/2022 0345   PLT 220 12/21/2022 0345   MCV 79.9 (L) 12/21/2022 0345   MCH 25.7 (L) 12/21/2022 0345   MCHC 32.2 12/21/2022 0345   RDW 14.9 12/21/2022 0345   LYMPHSABS 1.8 10/04/2021 1636   MONOABS 0.5 10/04/2021 1636   EOSABS 0.1 10/04/2021 1636   BASOSABS 0.1 10/04/2021 1636    BMET    Component Value Date/Time   NA 140 06/25/2024 1457   K 4.1 06/25/2024 1457   CL 100 06/25/2024 1457   CO2 25 06/25/2024 1457   GLUCOSE 91 06/25/2024 1457   GLUCOSE 141 (H) 12/21/2022 0345   BUN 18 06/25/2024 1457   CREATININE 0.86 06/25/2024 1457   CREATININE 0.73 10/21/2011  1238   CALCIUM  9.7 06/25/2024 1457   GFRNONAA >60 12/21/2022 0345   GFRAA >60 05/08/2020 0625    BNP    Component Value Date/Time   BNP 26.0 12/09/2019 1322     Imaging:  No results found.  Administration History     None          Latest Ref Rng & Units 06/01/2022    3:49 PM  PFT Results  FVC-Pre L 2.22   FVC-Predicted Pre % 78   FVC-Post L 2.09   FVC-Predicted Post % 73   Pre FEV1/FVC % % 81   Post FEV1/FCV % % 85   FEV1-Pre L 1.81   FEV1-Predicted Pre % 83   FEV1-Post L 1.79   DLCO uncorrected ml/min/mmHg 16.56   DLCO UNC% % 92   DLCO corrected ml/min/mmHg 16.56   DLCO COR %Predicted % 92   DLVA Predicted % 103   TLC L 4.37   TLC % Predicted % 94   RV % Predicted % 108     No results found for: NITRICOXIDE      Assessment & Plan:   Severe obstructive sleep apnea Severe OSA on CPAP. Excellent compliance and control. Receives benefit from use. Aware of proper care/use. Understands risks of untreated OSA. Healthy weight loss encouraged. Safe driving practices reviewed.  Patient Instructions  Continue CPAP auto 5-20 cmH2O every night, minimum of 4-6 hours a night.  Be aware of reduced alertness and do not drive or operate heavy machinery if experiencing this or drowsiness.    Continue Trazodone  25-50 mg (1/2 tab to 1 tab) At bedtime as needed for sleep. Take 30 minutes prior to bedtime. Put your CPAP on within 15 minutes after taking to ensure  you don't fall asleep without it on. Do not drive after taking.   Continue Albuterol  inhaler 2 puffs every 6 hours as needed for shortness of breath or wheezing. Notify if symptoms persist despite rescue inhaler/neb use.    Exercise 150 min/week  Mucinex DM over the counter as needed for the cough   Follow up in 1 year with Dr. Neda. If symptoms worsen, please contact office for sooner follow up or seek emergency care   Morbid obesity with BMI of 40.0-44.9, adult (HCC) BMI 42. Healthy weight loss  encouraged.   URI (upper respiratory infection) Gradual improvement with mild residual cough, likely upper airway in nature. Lung exam benign. Encouraged supportive care measures. Advised to notify if symptoms fail to improve or worsen. No SABA use.   Shortness of breath Mild restrictive defect with normal DLCO, likely related to obesity. She is working on healthy weight loss measures. Has not required SABA recently.        I spent 35 minutes of dedicated to the care of this patient on the date of this encounter to include pre-visit review of records, face-to-face time with the patient discussing conditions above, post visit ordering of testing, clinical documentation with the electronic health record, making appropriate referrals as documented, and communicating necessary findings to members of the patients care team.  Comer LULLA Rouleau, NP 07/29/2024  Pt aware and understands NP's role.

## 2024-08-04 MED ORDER — ATORVASTATIN CALCIUM 10 MG PO TABS: 10.0000 mg | ORAL_TABLET | Freq: Every day | ORAL | 3 refills | Status: AC
